# Patient Record
Sex: Female | Born: 1945
Health system: Southern US, Community
[De-identification: ages and names within clinical notes are randomized; demographics above are authoritative.]

## PROBLEM LIST (undated history)

## (undated) DIAGNOSIS — M1990 Unspecified osteoarthritis, unspecified site: Secondary | ICD-10-CM

## (undated) DIAGNOSIS — Z9889 Other specified postprocedural states: Secondary | ICD-10-CM

## (undated) DIAGNOSIS — M1710 Unilateral primary osteoarthritis, unspecified knee: Secondary | ICD-10-CM

## (undated) DIAGNOSIS — J189 Pneumonia, unspecified organism: Secondary | ICD-10-CM

## (undated) DIAGNOSIS — T4145XA Adverse effect of unspecified anesthetic, initial encounter: Secondary | ICD-10-CM

## (undated) DIAGNOSIS — M179 Osteoarthritis of knee, unspecified: Secondary | ICD-10-CM

## (undated) DIAGNOSIS — J302 Other seasonal allergic rhinitis: Secondary | ICD-10-CM

## (undated) DIAGNOSIS — R931 Abnormal findings on diagnostic imaging of heart and coronary circulation: Secondary | ICD-10-CM

## (undated) DIAGNOSIS — T8859XA Other complications of anesthesia, initial encounter: Secondary | ICD-10-CM

## (undated) DIAGNOSIS — R112 Nausea with vomiting, unspecified: Secondary | ICD-10-CM

## (undated) DIAGNOSIS — Z973 Presence of spectacles and contact lenses: Secondary | ICD-10-CM

## (undated) HISTORY — PX: ABDOMINAL HYSTERECTOMY: SHX81

## (undated) HISTORY — PX: TONSILLECTOMY: SUR1361

## (undated) HISTORY — PX: DILATION AND CURETTAGE OF UTERUS: SHX78

## (undated) HISTORY — PX: TUBAL LIGATION: SHX77

---

## 1998-11-15 ENCOUNTER — Other Ambulatory Visit: Admission: RE | Admit: 1998-11-15 | Discharge: 1998-11-15 | Payer: Self-pay | Admitting: Obstetrics and Gynecology

## 1999-10-07 ENCOUNTER — Inpatient Hospital Stay (HOSPITAL_COMMUNITY): Admission: RE | Admit: 1999-10-07 | Discharge: 1999-10-08 | Payer: Self-pay | Admitting: Urology

## 2000-01-23 ENCOUNTER — Encounter: Payer: Self-pay | Admitting: Obstetrics and Gynecology

## 2000-01-23 ENCOUNTER — Encounter: Admission: RE | Admit: 2000-01-23 | Discharge: 2000-01-23 | Payer: Self-pay | Admitting: Obstetrics and Gynecology

## 2000-11-24 HISTORY — PX: BLADDER SUSPENSION: SHX72

## 2000-12-18 ENCOUNTER — Other Ambulatory Visit: Admission: RE | Admit: 2000-12-18 | Discharge: 2000-12-18 | Payer: Self-pay | Admitting: Obstetrics and Gynecology

## 2001-01-25 ENCOUNTER — Encounter: Payer: Self-pay | Admitting: Obstetrics and Gynecology

## 2001-01-25 ENCOUNTER — Encounter: Admission: RE | Admit: 2001-01-25 | Discharge: 2001-01-25 | Payer: Self-pay | Admitting: Obstetrics and Gynecology

## 2002-01-21 ENCOUNTER — Ambulatory Visit (HOSPITAL_COMMUNITY): Admission: RE | Admit: 2002-01-21 | Discharge: 2002-01-21 | Payer: Self-pay | Admitting: Gastroenterology

## 2002-02-16 ENCOUNTER — Encounter: Admission: RE | Admit: 2002-02-16 | Discharge: 2002-02-16 | Payer: Self-pay | Admitting: Obstetrics and Gynecology

## 2002-02-16 ENCOUNTER — Encounter: Payer: Self-pay | Admitting: Obstetrics and Gynecology

## 2002-02-21 ENCOUNTER — Encounter: Payer: Self-pay | Admitting: Obstetrics and Gynecology

## 2002-02-21 ENCOUNTER — Encounter: Admission: RE | Admit: 2002-02-21 | Discharge: 2002-02-21 | Payer: Self-pay | Admitting: Obstetrics and Gynecology

## 2002-08-02 ENCOUNTER — Encounter: Payer: Self-pay | Admitting: Obstetrics and Gynecology

## 2002-08-02 ENCOUNTER — Encounter: Admission: RE | Admit: 2002-08-02 | Discharge: 2002-08-02 | Payer: Self-pay | Admitting: Obstetrics and Gynecology

## 2003-02-24 ENCOUNTER — Encounter: Payer: Self-pay | Admitting: Obstetrics and Gynecology

## 2003-02-24 ENCOUNTER — Encounter: Admission: RE | Admit: 2003-02-24 | Discharge: 2003-02-24 | Payer: Self-pay | Admitting: Obstetrics and Gynecology

## 2004-01-11 ENCOUNTER — Other Ambulatory Visit: Admission: RE | Admit: 2004-01-11 | Discharge: 2004-01-11 | Payer: Self-pay | Admitting: Obstetrics and Gynecology

## 2004-01-12 ENCOUNTER — Encounter: Admission: RE | Admit: 2004-01-12 | Discharge: 2004-01-12 | Payer: Self-pay | Admitting: Obstetrics and Gynecology

## 2004-03-06 ENCOUNTER — Encounter: Admission: RE | Admit: 2004-03-06 | Discharge: 2004-03-06 | Payer: Self-pay | Admitting: Obstetrics and Gynecology

## 2005-03-26 ENCOUNTER — Encounter: Admission: RE | Admit: 2005-03-26 | Discharge: 2005-03-26 | Payer: Self-pay | Admitting: Obstetrics and Gynecology

## 2006-04-07 ENCOUNTER — Encounter: Admission: RE | Admit: 2006-04-07 | Discharge: 2006-04-07 | Payer: Self-pay | Admitting: Obstetrics and Gynecology

## 2007-04-12 ENCOUNTER — Encounter: Admission: RE | Admit: 2007-04-12 | Discharge: 2007-04-12 | Payer: Self-pay | Admitting: Obstetrics and Gynecology

## 2008-04-12 ENCOUNTER — Encounter: Admission: RE | Admit: 2008-04-12 | Discharge: 2008-04-12 | Payer: Self-pay | Admitting: Obstetrics and Gynecology

## 2009-05-02 ENCOUNTER — Encounter: Admission: RE | Admit: 2009-05-02 | Discharge: 2009-05-02 | Payer: Self-pay | Admitting: Obstetrics and Gynecology

## 2010-05-13 ENCOUNTER — Encounter: Admission: RE | Admit: 2010-05-13 | Discharge: 2010-05-13 | Payer: Self-pay | Admitting: Obstetrics and Gynecology

## 2011-04-14 ENCOUNTER — Other Ambulatory Visit: Payer: Self-pay | Admitting: Obstetrics and Gynecology

## 2011-04-14 DIAGNOSIS — Z1231 Encounter for screening mammogram for malignant neoplasm of breast: Secondary | ICD-10-CM

## 2011-05-21 ENCOUNTER — Ambulatory Visit
Admission: RE | Admit: 2011-05-21 | Discharge: 2011-05-21 | Disposition: A | Discharge status: Home / Self Care | Payer: BC Managed Care – PPO | Source: Ambulatory Visit | Attending: Obstetrics and Gynecology | Admitting: Obstetrics and Gynecology

## 2011-05-21 DIAGNOSIS — Z1231 Encounter for screening mammogram for malignant neoplasm of breast: Secondary | ICD-10-CM

## 2012-05-31 ENCOUNTER — Other Ambulatory Visit: Payer: Self-pay | Admitting: Obstetrics and Gynecology

## 2012-05-31 DIAGNOSIS — Z1231 Encounter for screening mammogram for malignant neoplasm of breast: Secondary | ICD-10-CM

## 2012-06-11 ENCOUNTER — Ambulatory Visit
Admission: RE | Admit: 2012-06-11 | Discharge: 2012-06-11 | Disposition: A | Discharge status: Home / Self Care | Payer: Medicare Other | Source: Ambulatory Visit | Attending: Obstetrics and Gynecology | Admitting: Obstetrics and Gynecology

## 2012-06-11 DIAGNOSIS — Z1231 Encounter for screening mammogram for malignant neoplasm of breast: Secondary | ICD-10-CM

## 2013-06-17 DIAGNOSIS — E785 Hyperlipidemia, unspecified: Secondary | ICD-10-CM | POA: Insufficient documentation

## 2013-06-17 DIAGNOSIS — K589 Irritable bowel syndrome without diarrhea: Secondary | ICD-10-CM | POA: Insufficient documentation

## 2013-06-17 DIAGNOSIS — M1990 Unspecified osteoarthritis, unspecified site: Secondary | ICD-10-CM | POA: Insufficient documentation

## 2013-07-18 ENCOUNTER — Other Ambulatory Visit: Payer: Self-pay

## 2013-07-18 DIAGNOSIS — Z1231 Encounter for screening mammogram for malignant neoplasm of breast: Secondary | ICD-10-CM

## 2013-08-09 ENCOUNTER — Ambulatory Visit
Admission: RE | Admit: 2013-08-09 | Discharge: 2013-08-09 | Disposition: A | Discharge status: Home / Self Care | Payer: Medicare Other | Source: Ambulatory Visit

## 2013-08-09 DIAGNOSIS — Z1231 Encounter for screening mammogram for malignant neoplasm of breast: Secondary | ICD-10-CM

## 2014-07-10 ENCOUNTER — Other Ambulatory Visit: Payer: Self-pay | Admitting: Internal Medicine

## 2014-07-10 DIAGNOSIS — Z1231 Encounter for screening mammogram for malignant neoplasm of breast: Secondary | ICD-10-CM

## 2014-08-10 ENCOUNTER — Ambulatory Visit
Admission: RE | Admit: 2014-08-10 | Discharge: 2014-08-10 | Disposition: A | Discharge status: Home / Self Care | Payer: Medicare Other | Source: Ambulatory Visit | Attending: Internal Medicine | Admitting: Internal Medicine

## 2014-08-10 DIAGNOSIS — Z1231 Encounter for screening mammogram for malignant neoplasm of breast: Secondary | ICD-10-CM

## 2015-03-29 ENCOUNTER — Other Ambulatory Visit: Payer: Self-pay | Admitting: Orthopaedic Surgery

## 2015-03-29 DIAGNOSIS — M545 Low back pain: Secondary | ICD-10-CM

## 2015-03-31 ENCOUNTER — Ambulatory Visit
Admission: RE | Admit: 2015-03-31 | Discharge: 2015-03-31 | Disposition: A | Discharge status: Home / Self Care | Payer: Medicare Other | Source: Ambulatory Visit | Attending: Orthopaedic Surgery | Admitting: Orthopaedic Surgery

## 2015-03-31 DIAGNOSIS — M545 Low back pain: Secondary | ICD-10-CM

## 2015-05-24 ENCOUNTER — Other Ambulatory Visit (HOSPITAL_COMMUNITY): Payer: Self-pay | Admitting: Specialist

## 2015-05-25 ENCOUNTER — Encounter (HOSPITAL_COMMUNITY)
Admission: RE | Admit: 2015-05-25 | Discharge: 2015-05-25 | Disposition: A | Discharge status: Home / Self Care | Payer: Medicare Other | Source: Ambulatory Visit | Attending: Specialist | Admitting: Specialist

## 2015-05-25 ENCOUNTER — Encounter (HOSPITAL_COMMUNITY): Payer: Self-pay

## 2015-05-25 DIAGNOSIS — M5126 Other intervertebral disc displacement, lumbar region: Secondary | ICD-10-CM | POA: Insufficient documentation

## 2015-05-25 DIAGNOSIS — Z01812 Encounter for preprocedural laboratory examination: Secondary | ICD-10-CM | POA: Diagnosis present

## 2015-05-25 HISTORY — DX: Other complications of anesthesia, initial encounter: T88.59XA

## 2015-05-25 HISTORY — DX: Unspecified osteoarthritis, unspecified site: M19.90

## 2015-05-25 HISTORY — DX: Adverse effect of unspecified anesthetic, initial encounter: T41.45XA

## 2015-05-25 LAB — CBC
HCT: 41.4 % (ref 36.0–46.0)
Hemoglobin: 13.6 g/dL (ref 12.0–15.0)
MCH: 29.6 pg (ref 26.0–34.0)
MCHC: 32.9 g/dL (ref 30.0–36.0)
MCV: 90 fL (ref 78.0–100.0)
Platelets: 412 10*3/uL — ABNORMAL HIGH (ref 150–400)
RBC: 4.6 MIL/uL (ref 3.87–5.11)
RDW: 13.2 % (ref 11.5–15.5)
WBC: 8.9 10*3/uL (ref 4.0–10.5)

## 2015-05-25 LAB — SURGICAL PCR SCREEN
MRSA, PCR: NEGATIVE
STAPHYLOCOCCUS AUREUS: NEGATIVE

## 2015-05-25 NOTE — Pre-Procedure Instructions (Addendum)
    Lance Sellheresa K Windt  05/25/2015     No Pharmacies Listed   Your procedure is scheduled on 05/29/15.  Report to [facility] Admitting at 7 A.M.  Call this number if you have problems the morning of surgery:  [phone]   Remember:  Do not eat food or drink liquids after midnight.  Take these medicines the morning of surgery with A SIP OF WATER: Zyrtec, Hydrocodone   STOP taking any NSAIDs (Nonsteroidal Anti-inflammatories), Vitamins, Fish Oil, Supplements, Goody's/ BC powders, aspirins 5 days prior to surgery.     Do not wear jewelry, make-up or nail polish.  Do not wear lotions, powders, or perfumes.  You may wear deodorant.  Do not shave 48 hours prior to surgery.    Do not bring valuables to the hospital.  [facility] not responsible for any belongings or valuables.  Contacts, dentures or bridgework may not be worn into surgery.  Leave your suitcase in the car.  After surgery it may be brought to your room.  For patients admitted to the hospital, discharge time will be determined by your treatment team.  Patients discharged the day of surgery will not be allowed to drive home.   Name and phone number of your driver:    Special instructions:    Please read over the following fact sheets that you were given. Pain Booklet, Coughing and Deep Breathing and Surgical Site Infection Prevention

## 2015-05-29 ENCOUNTER — Ambulatory Visit (HOSPITAL_COMMUNITY): Payer: Medicare Other

## 2015-05-29 ENCOUNTER — Ambulatory Visit (HOSPITAL_COMMUNITY): Payer: Medicare Other | Admitting: Anesthesiology

## 2015-05-29 ENCOUNTER — Observation Stay (HOSPITAL_COMMUNITY)
Admission: RE | Admit: 2015-05-29 | Discharge: 2015-05-30 | Disposition: A | Discharge status: Home / Self Care | Payer: Medicare Other | Source: Ambulatory Visit | Attending: Specialist | Admitting: Specialist

## 2015-05-29 ENCOUNTER — Encounter (HOSPITAL_COMMUNITY)
Admission: RE | Disposition: A | Discharge status: Home / Self Care | Payer: Self-pay | Source: Ambulatory Visit | Attending: Specialist

## 2015-05-29 ENCOUNTER — Encounter (HOSPITAL_COMMUNITY): Payer: Self-pay | Admitting: *Deleted

## 2015-05-29 DIAGNOSIS — M5126 Other intervertebral disc displacement, lumbar region: Secondary | ICD-10-CM | POA: Diagnosis not present

## 2015-05-29 DIAGNOSIS — Z87891 Personal history of nicotine dependence: Secondary | ICD-10-CM | POA: Diagnosis not present

## 2015-05-29 DIAGNOSIS — M4316 Spondylolisthesis, lumbar region: Secondary | ICD-10-CM | POA: Diagnosis present

## 2015-05-29 DIAGNOSIS — Z419 Encounter for procedure for purposes other than remedying health state, unspecified: Secondary | ICD-10-CM

## 2015-05-29 DIAGNOSIS — M5116 Intervertebral disc disorders with radiculopathy, lumbar region: Secondary | ICD-10-CM | POA: Diagnosis present

## 2015-05-29 HISTORY — PX: LUMBAR LAMINECTOMY: SHX95

## 2015-05-29 LAB — COMPREHENSIVE METABOLIC PANEL
ALBUMIN: 3.8 g/dL (ref 3.5–5.0)
ALT: 23 U/L (ref 14–54)
ANION GAP: 12 (ref 5–15)
AST: 28 U/L (ref 15–41)
Alkaline Phosphatase: 89 U/L (ref 38–126)
BUN: 15 mg/dL (ref 6–20)
CO2: 28 mmol/L (ref 22–32)
Calcium: 9.9 mg/dL (ref 8.9–10.3)
Chloride: 100 mmol/L — ABNORMAL LOW (ref 101–111)
Creatinine, Ser: 0.73 mg/dL (ref 0.44–1.00)
GFR calc non Af Amer: >60 mL/min (ref >60)
Glucose, Bld: 94 mg/dL (ref 65–99)
Potassium: 4.5 mmol/L (ref 3.5–5.1)
SODIUM: 140 mmol/L (ref 135–145)
TOTAL PROTEIN: 7.5 g/dL (ref 6.5–8.1)
Total Bilirubin: 1.2 mg/dL (ref 0.3–1.2)

## 2015-05-29 SURGERY — MICRODISCECTOMY LUMBAR LAMINECTOMY
Anesthesia: General | Site: Spine Lumbar

## 2015-05-29 MED ORDER — ROCURONIUM BROMIDE 100 MG/10ML IV SOLN
INTRAVENOUS | Status: DC | PRN
  Administered 2015-05-29: 30 mg via INTRAVENOUS

## 2015-05-29 MED ORDER — HYDROCODONE-ACETAMINOPHEN 5-325 MG PO TABS
1.0000 | ORAL_TABLET | ORAL | Status: DC | PRN
  Administered 2015-05-29 – 2015-05-30 (×2): 1 via ORAL
  Filled 2015-05-29 (×2): qty 1

## 2015-05-29 MED ORDER — SODIUM CHLORIDE 0.9 % IV SOLN: 250.0000 mL | INTRAVENOUS | Status: DC

## 2015-05-29 MED ORDER — CHLORHEXIDINE GLUCONATE 4 % EX LIQD: 60.0000 mL | Freq: Once | CUTANEOUS | Status: DC

## 2015-05-29 MED ORDER — MORPHINE SULFATE 2 MG/ML IJ SOLN: 1.0000 mg | INTRAMUSCULAR | Status: DC | PRN

## 2015-05-29 MED ORDER — GLYCOPYRROLATE 0.2 MG/ML IJ SOLN
INTRAMUSCULAR | Status: AC
  Filled 2015-05-29: qty 1

## 2015-05-29 MED ORDER — DEXAMETHASONE SODIUM PHOSPHATE 4 MG/ML IJ SOLN
INTRAMUSCULAR | Status: DC | PRN
Start: 2015-05-29 — End: 2015-05-29
  Administered 2015-05-29: 4 mg via INTRAVENOUS

## 2015-05-29 MED ORDER — METHOCARBAMOL 500 MG PO TABS: 500.0000 mg | ORAL_TABLET | Freq: Four times a day (QID) | ORAL | Status: DC | PRN

## 2015-05-29 MED ORDER — BUPIVACAINE LIPOSOME 1.3 % IJ SUSP
20.0000 mL | INTRAMUSCULAR | Status: DC
  Filled 2015-05-29: qty 20

## 2015-05-29 MED ORDER — METOCLOPRAMIDE HCL 5 MG/ML IJ SOLN
INTRAMUSCULAR | Status: AC
  Filled 2015-05-29: qty 2

## 2015-05-29 MED ORDER — ONDANSETRON HCL 4 MG/2ML IJ SOLN
INTRAMUSCULAR | Status: DC | PRN
  Administered 2015-05-29: 4 mg via INTRAVENOUS

## 2015-05-29 MED ORDER — DIPHENHYDRAMINE HCL 50 MG/ML IJ SOLN
INTRAMUSCULAR | Status: DC | PRN
  Administered 2015-05-29: 12.5 mg via INTRAVENOUS

## 2015-05-29 MED ORDER — OXYCODONE HCL 5 MG PO TABS: 5.0000 mg | ORAL_TABLET | Freq: Once | ORAL | Status: DC | PRN

## 2015-05-29 MED ORDER — PROPOFOL 10 MG/ML IV BOLUS
INTRAVENOUS | Status: DC | PRN
  Administered 2015-05-29: 120 mg via INTRAVENOUS
  Administered 2015-05-29: 30 mg via INTRAVENOUS

## 2015-05-29 MED ORDER — THROMBIN 20000 UNITS EX KIT
PACK | CUTANEOUS | Status: DC | PRN
  Administered 2015-05-29: 20 mL via TOPICAL

## 2015-05-29 MED ORDER — SODIUM CHLORIDE 0.9 % IJ SOLN: 3.0000 mL | INTRAMUSCULAR | Status: DC | PRN

## 2015-05-29 MED ORDER — ALUM & MAG HYDROXIDE-SIMETH 200-200-20 MG/5ML PO SUSP: 30.0000 mL | Freq: Four times a day (QID) | ORAL | Status: DC | PRN

## 2015-05-29 MED ORDER — HYDROMORPHONE HCL 1 MG/ML IJ SOLN: 0.2500 mg | INTRAMUSCULAR | Status: DC | PRN

## 2015-05-29 MED ORDER — 0.9 % SODIUM CHLORIDE (POUR BTL) OPTIME
TOPICAL | Status: DC | PRN
Start: 2015-05-29 — End: 2015-05-29
  Administered 2015-05-29: 1000 mL

## 2015-05-29 MED ORDER — PROMETHAZINE HCL 25 MG/ML IJ SOLN: 6.2500 mg | INTRAMUSCULAR | Status: DC | PRN

## 2015-05-29 MED ORDER — ONDANSETRON HCL 4 MG/2ML IJ SOLN: 4.0000 mg | INTRAMUSCULAR | Status: DC | PRN

## 2015-05-29 MED ORDER — MIDAZOLAM HCL 5 MG/5ML IJ SOLN
INTRAMUSCULAR | Status: DC | PRN
  Administered 2015-05-29: 1 mg via INTRAVENOUS

## 2015-05-29 MED ORDER — CEFAZOLIN SODIUM 1-5 GM-% IV SOLN
1.0000 g | Freq: Three times a day (TID) | INTRAVENOUS | Status: AC
  Administered 2015-05-29 – 2015-05-30 (×2): 1 g via INTRAVENOUS
  Filled 2015-05-29 (×2): qty 50

## 2015-05-29 MED ORDER — SCOPOLAMINE 1 MG/3DAYS TD PT72
1.0000 | MEDICATED_PATCH | TRANSDERMAL | Status: DC
  Administered 2015-05-29: 1.5 mg via TRANSDERMAL
  Filled 2015-05-29: qty 1

## 2015-05-29 MED ORDER — METOCLOPRAMIDE HCL 5 MG/ML IJ SOLN
INTRAMUSCULAR | Status: DC | PRN
  Administered 2015-05-29: 10 mg via INTRAVENOUS

## 2015-05-29 MED ORDER — FENTANYL CITRATE (PF) 100 MCG/2ML IJ SOLN
INTRAMUSCULAR | Status: DC | PRN
  Administered 2015-05-29: 125 ug via INTRAVENOUS
  Administered 2015-05-29: 25 ug via INTRAVENOUS
  Administered 2015-05-29: 50 ug via INTRAVENOUS

## 2015-05-29 MED ORDER — ROCURONIUM BROMIDE 50 MG/5ML IV SOLN
INTRAVENOUS | Status: AC
  Filled 2015-05-29: qty 1

## 2015-05-29 MED ORDER — OXYCODONE HCL 5 MG/5ML PO SOLN: 5.0000 mg | Freq: Once | ORAL | Status: DC | PRN

## 2015-05-29 MED ORDER — BUPIVACAINE HCL 0.5 % IJ SOLN
INTRAMUSCULAR | Status: DC | PRN
  Administered 2015-05-29: 20 mL
  Administered 2015-05-29: 5 mL

## 2015-05-29 MED ORDER — MIDAZOLAM HCL 2 MG/2ML IJ SOLN
INTRAMUSCULAR | Status: AC
  Filled 2015-05-29: qty 2

## 2015-05-29 MED ORDER — CEFAZOLIN SODIUM-DEXTROSE 2-3 GM-% IV SOLR
2.0000 g | INTRAVENOUS | Status: AC
  Administered 2015-05-29: 2 g via INTRAVENOUS

## 2015-05-29 MED ORDER — SCOPOLAMINE 1 MG/3DAYS TD PT72
MEDICATED_PATCH | TRANSDERMAL | Status: AC
  Administered 2015-05-29: 1.5 mg via TRANSDERMAL
  Filled 2015-05-29: qty 1

## 2015-05-29 MED ORDER — GLYCOPYRROLATE 0.2 MG/ML IJ SOLN
INTRAMUSCULAR | Status: DC | PRN
  Administered 2015-05-29: 0.1 mg via INTRAVENOUS

## 2015-05-29 MED ORDER — FENTANYL CITRATE (PF) 250 MCG/5ML IJ SOLN
INTRAMUSCULAR | Status: AC
  Filled 2015-05-29: qty 5

## 2015-05-29 MED ORDER — ACETAMINOPHEN 650 MG RE SUPP: 650.0000 mg | RECTAL | Status: DC | PRN

## 2015-05-29 MED ORDER — DOCUSATE SODIUM 100 MG PO CAPS
100.0000 mg | ORAL_CAPSULE | Freq: Two times a day (BID) | ORAL | Status: DC
  Administered 2015-05-29: 100 mg via ORAL
  Filled 2015-05-29: qty 1

## 2015-05-29 MED ORDER — EPHEDRINE SULFATE 50 MG/ML IJ SOLN
INTRAMUSCULAR | Status: AC
  Filled 2015-05-29: qty 1

## 2015-05-29 MED ORDER — LIDOCAINE HCL (CARDIAC) 20 MG/ML IV SOLN
INTRAVENOUS | Status: DC | PRN
  Administered 2015-05-29: 50 mg via INTRAVENOUS

## 2015-05-29 MED ORDER — ONDANSETRON HCL 4 MG/2ML IJ SOLN
INTRAMUSCULAR | Status: AC
  Filled 2015-05-29: qty 2

## 2015-05-29 MED ORDER — SODIUM CHLORIDE 0.9 % IJ SOLN
INTRAMUSCULAR | Status: AC
  Filled 2015-05-29: qty 10

## 2015-05-29 MED ORDER — KETOROLAC TROMETHAMINE 30 MG/ML IJ SOLN
30.0000 mg | Freq: Once | INTRAMUSCULAR | Status: AC
  Administered 2015-05-29: 30 mg via INTRAVENOUS
  Filled 2015-05-29: qty 1

## 2015-05-29 MED ORDER — MENTHOL 3 MG MT LOZG: 1.0000 | LOZENGE | OROMUCOSAL | Status: DC | PRN

## 2015-05-29 MED ORDER — BUPIVACAINE LIPOSOME 1.3 % IJ SUSP
INTRAMUSCULAR | Status: DC | PRN
Start: 2015-05-29 — End: 2015-05-29
  Administered 2015-05-29: 5 mL
  Administered 2015-05-29: 20 mL

## 2015-05-29 MED ORDER — OXYCODONE-ACETAMINOPHEN 5-325 MG PO TABS: 1.0000 | ORAL_TABLET | Freq: Four times a day (QID) | ORAL | Status: DC | PRN

## 2015-05-29 MED ORDER — PROPOFOL 10 MG/ML IV BOLUS
INTRAVENOUS | Status: AC
  Filled 2015-05-29: qty 20

## 2015-05-29 MED ORDER — LORATADINE 10 MG PO TABS
10.0000 mg | ORAL_TABLET | Freq: Every day | ORAL | Status: DC
  Administered 2015-05-29: 10 mg via ORAL
  Filled 2015-05-29: qty 1

## 2015-05-29 MED ORDER — EPHEDRINE SULFATE 50 MG/ML IJ SOLN
INTRAMUSCULAR | Status: DC | PRN
  Administered 2015-05-29 (×2): 5 mg via INTRAVENOUS
  Administered 2015-05-29: 10 mg via INTRAVENOUS
  Administered 2015-05-29 (×4): 5 mg via INTRAVENOUS

## 2015-05-29 MED ORDER — ACETAMINOPHEN 325 MG PO TABS: 650.0000 mg | ORAL_TABLET | ORAL | Status: DC | PRN

## 2015-05-29 MED ORDER — POLYETHYLENE GLYCOL 3350 17 G PO PACK: 17.0000 g | PACK | Freq: Every day | ORAL | Status: DC | PRN

## 2015-05-29 MED ORDER — THROMBIN 20000 UNITS EX SOLR
CUTANEOUS | Status: AC
  Filled 2015-05-29: qty 20000

## 2015-05-29 MED ORDER — OXYCODONE-ACETAMINOPHEN 5-325 MG PO TABS: 1.0000 | ORAL_TABLET | ORAL | Status: DC | PRN

## 2015-05-29 MED ORDER — TIZANIDINE HCL 2 MG PO TABS: 2.0000 mg | ORAL_TABLET | Freq: Four times a day (QID) | ORAL | Status: DC | PRN

## 2015-05-29 MED ORDER — ATORVASTATIN CALCIUM 40 MG PO TABS
40.0000 mg | ORAL_TABLET | Freq: Every day | ORAL | Status: DC
  Administered 2015-05-29: 40 mg via ORAL
  Filled 2015-05-29: qty 1

## 2015-05-29 MED ORDER — LACTATED RINGERS IV SOLN
INTRAVENOUS | Status: DC
  Administered 2015-05-29 (×3): via INTRAVENOUS

## 2015-05-29 MED ORDER — PHENOL 1.4 % MT LIQD: 1.0000 | OROMUCOSAL | Status: DC | PRN

## 2015-05-29 MED ORDER — DIPHENHYDRAMINE HCL 50 MG/ML IJ SOLN
INTRAMUSCULAR | Status: AC
  Filled 2015-05-29: qty 1

## 2015-05-29 MED ORDER — DEXAMETHASONE SODIUM PHOSPHATE 4 MG/ML IJ SOLN
INTRAMUSCULAR | Status: AC
  Filled 2015-05-29: qty 1

## 2015-05-29 MED ORDER — SODIUM CHLORIDE 0.9 % IJ SOLN
3.0000 mL | Freq: Two times a day (BID) | INTRAMUSCULAR | Status: DC
  Administered 2015-05-29: 3 mL via INTRAVENOUS

## 2015-05-29 MED ORDER — CEFAZOLIN SODIUM-DEXTROSE 2-3 GM-% IV SOLR
INTRAVENOUS | Status: AC
  Filled 2015-05-29: qty 50

## 2015-05-29 MED ORDER — METHOCARBAMOL 1000 MG/10ML IJ SOLN
500.0000 mg | Freq: Four times a day (QID) | INTRAMUSCULAR | Status: DC | PRN
  Filled 2015-05-29: qty 5

## 2015-05-29 SURGICAL SUPPLY — 52 items
ADH SKN CLS APL DERMABOND .7 (GAUZE/BANDAGES/DRESSINGS) ×1
BUR MATCHSTICK NEURO 3.0 LAGG (BURR) ×2 IMPLANT
BUR ROUND FLUTED 4 SOFT TCH (BURR) IMPLANT
BUR ROUND FLUTED 4MM SOFT TCH (BURR)
CANISTER SUCTION 2500CC (MISCELLANEOUS) ×3 IMPLANT
CLOSURE WOUND 1/2 X4 (GAUZE/BANDAGES/DRESSINGS) ×1
COVER MAYO STAND STRL (DRAPES) ×3 IMPLANT
COVER SURGICAL LIGHT HANDLE (MISCELLANEOUS) ×3 IMPLANT
DERMABOND ADVANCED (GAUZE/BANDAGES/DRESSINGS) ×2
DERMABOND ADVANCED .7 DNX12 (GAUZE/BANDAGES/DRESSINGS) ×1 IMPLANT
DRAPE C-ARM 42X72 X-RAY (DRAPES) ×3 IMPLANT
DRAPE MICROSCOPE LEICA (MISCELLANEOUS) ×3 IMPLANT
DRAPE PROXIMA HALF (DRAPES) ×2 IMPLANT
DRAPE SURG 17X23 STRL (DRAPES) ×12 IMPLANT
DRSG MEPILEX BORDER 4X4 (GAUZE/BANDAGES/DRESSINGS) ×2 IMPLANT
DRSG MEPILEX BORDER 4X8 (GAUZE/BANDAGES/DRESSINGS) IMPLANT
DURAPREP 26ML APPLICATOR (WOUND CARE) ×3 IMPLANT
ELECT BLADE 4.0 EZ CLEAN MEGAD (MISCELLANEOUS) ×3
ELECT CAUTERY BLADE 6.4 (BLADE) ×3 IMPLANT
ELECT REM PT RETURN 9FT ADLT (ELECTROSURGICAL) ×3
ELECTRODE BLDE 4.0 EZ CLN MEGD (MISCELLANEOUS) ×1 IMPLANT
ELECTRODE REM PT RTRN 9FT ADLT (ELECTROSURGICAL) ×1 IMPLANT
GLOVE BIOGEL PI IND STRL 8 (GLOVE) ×1 IMPLANT
GLOVE BIOGEL PI INDICATOR 8 (GLOVE) ×2
GLOVE ECLIPSE 8.5 STRL (GLOVE) ×3 IMPLANT
GLOVE ORTHO TXT STRL SZ7.5 (GLOVE) ×3 IMPLANT
GLOVE SURG 8.5 LATEX PF (GLOVE) ×3 IMPLANT
GOWN STRL REUS W/ TWL LRG LVL3 (GOWN DISPOSABLE) ×2 IMPLANT
GOWN STRL REUS W/TWL 2XL LVL3 (GOWN DISPOSABLE) ×6 IMPLANT
GOWN STRL REUS W/TWL LRG LVL3 (GOWN DISPOSABLE) ×6
KIT BASIN OR (CUSTOM PROCEDURE TRAY) ×3 IMPLANT
KIT ROOM TURNOVER OR (KITS) ×3 IMPLANT
NDL SPNL 18GX3.5 QUINCKE PK (NEEDLE) ×2 IMPLANT
NEEDLE SPNL 18GX3.5 QUINCKE PK (NEEDLE) ×6 IMPLANT
NS IRRIG 1000ML POUR BTL (IV SOLUTION) ×3 IMPLANT
PACK LAMINECTOMY ORTHO (CUSTOM PROCEDURE TRAY) ×3 IMPLANT
PAD ARMBOARD 7.5X6 YLW CONV (MISCELLANEOUS) ×6 IMPLANT
PATTIES SURGICAL .5 X.5 (GAUZE/BANDAGES/DRESSINGS) ×2 IMPLANT
PATTIES SURGICAL .75X.75 (GAUZE/BANDAGES/DRESSINGS) IMPLANT
SPONGE LAP 4X18 X RAY DECT (DISPOSABLE) IMPLANT
SPONGE SURGIFOAM ABS GEL 100 (HEMOSTASIS) ×2 IMPLANT
STRIP CLOSURE SKIN 1/2X4 (GAUZE/BANDAGES/DRESSINGS) ×1 IMPLANT
SUT VIC AB 1 CTX 36 (SUTURE) ×3
SUT VIC AB 1 CTX36XBRD ANBCTR (SUTURE) ×1 IMPLANT
SUT VIC AB 2-0 CT1 27 (SUTURE) ×3
SUT VIC AB 2-0 CT1 TAPERPNT 27 (SUTURE) ×1 IMPLANT
SUT VIC AB 3-0 X1 27 (SUTURE) ×3 IMPLANT
SUT VICRYL 0 UR6 27IN ABS (SUTURE) ×3 IMPLANT
SYR 20CC LL (SYRINGE) ×3 IMPLANT
SYR CONTROL 10ML LL (SYRINGE) ×3 IMPLANT
TOWEL OR 17X24 6PK STRL BLUE (TOWEL DISPOSABLE) ×3 IMPLANT
TOWEL OR 17X26 10 PK STRL BLUE (TOWEL DISPOSABLE) ×3 IMPLANT

## 2015-05-29 NOTE — Anesthesia Preprocedure Evaluation (Addendum)
Anesthesia Evaluation  Patient identified by MRN, date of birth, ID band Patient awake    Reviewed: Allergy & Precautions, NPO status , Patient's Chart, lab work & pertinent test results  History of Anesthesia Complications (+) PONV  Airway Mallampati: II  TM Distance: >3 FB Neck ROM: Full    Dental   Pulmonary former smoker,  breath sounds clear to auscultation        Cardiovascular negative cardio ROS  Rhythm:Regular Rate:Normal     Neuro/Psych negative neurological ROS     GI/Hepatic negative GI ROS, Neg liver ROS,   Endo/Other  negative endocrine ROS  Renal/GU negative Renal ROS     Musculoskeletal  (+) Arthritis -,   Abdominal   Peds  Hematology negative hematology ROS (+)   Anesthesia Other Findings   Reproductive/Obstetrics                            Anesthesia Physical Anesthesia Plan  ASA: II  Anesthesia Plan: General   Post-op Pain Management:    Induction: Intravenous  Airway Management Planned: Oral ETT  Additional Equipment:   Intra-op Plan:   Post-operative Plan: Extubation in OR  Informed Consent: I have reviewed the patients History and Physical, chart, labs and discussed the procedure including the risks, benefits and alternatives for the proposed anesthesia with the patient or authorized representative who has indicated his/her understanding and acceptance.   Dental advisory given  Plan Discussed with: CRNA  Anesthesia Plan Comments:         Anesthesia Quick Evaluation

## 2015-05-29 NOTE — Anesthesia Postprocedure Evaluation (Signed)
  Anesthesia Post-op Note  Patient: Bianca Ware  Procedure(s) Performed: Procedure(s): LEFT L4-5 MICRODISCECTOMY (N/A)  Patient Location: PACU  Anesthesia Type:General  Level of Consciousness: awake, alert  and oriented  Airway and Oxygen Therapy: Patient Spontanous Breathing  Post-op Pain: mild  Post-op Assessment: Post-op Vital signs reviewed              Post-op Vital Signs: Reviewed  Last Vitals:  Filed Vitals:   05/29/15 1250  BP: 159/70  Pulse: 72  Temp: 36.4 C  Resp: 17    Complications: No apparent anesthesia complications

## 2015-05-29 NOTE — Brief Op Note (Signed)
05/29/2015  11:34 AM  PATIENT:  Bianca Ware  69 y.o. female  PRE-OPERATIVE DIAGNOSIS:  Left L4-5 herniated nucleus pulposus  POST-OPERATIVE DIAGNOSIS:  Left L4-5 herniated nucleus pulposus  PROCEDURE:  Procedure(s): LEFT L4-5 MICRODISCECTOMY (N/A)  SURGEON:  Surgeon(s) and Role:    * Kerrin ChampagneJames E Zoa Dowty, MD - Primary  PHYSICIAN ASSISTANT:Zuleica Seith Barry Dieneswens, PA-C    ANESTHESIA:   local and general, Dr. Sampson GoonFitzgerald.  EBL:  Total I/O In: 1000 [I.V.:1000] Out: 150 [Blood:150]  BLOOD ADMINISTERED:none  DRAINS: none   LOCAL MEDICATIONS USED:  MARCAINE 0.5% 1:1 EXPAREL 1.3%  Amount: 30 ml  SPECIMEN:  No Specimen  DISPOSITION OF SPECIMEN:  N/A  COUNTS:  YES  TOURNIQUET:  * No tourniquets in log *  DICTATION: .Dragon Dictation  PLAN OF CARE: Admit for overnight observation  PATIENT DISPOSITION:  PACU - hemodynamically stable.   Delay start of Pharmacological VTE agent (>24hrs) due to surgical blood loss or risk of bleeding: yes

## 2015-05-29 NOTE — Plan of Care (Signed)
Problem: Consults Goal: Diagnosis - Spinal Surgery Microdiscectomy     

## 2015-05-29 NOTE — Transfer of Care (Signed)
Immediate Anesthesia Transfer of Care Note  Patient: Bianca Ware  Procedure(s) Performed: Procedure(s): LEFT L4-5 MICRODISCECTOMY (N/A)  Patient Location: PACU  Anesthesia Type:General  Level of Consciousness: awake, alert  and oriented  Airway & Oxygen Therapy: Patient Spontanous Breathing and Patient connected to nasal cannula oxygen  Post-op Assessment: Report given to RN and Post -op Vital signs reviewed and stable  Post vital signs: Reviewed and stable  Last Vitals:  Filed Vitals:   05/29/15 1202  BP: 160/72  Pulse: 94  Temp: 36.6 C  Resp: 10    Complications: No apparent anesthesia complications

## 2015-05-29 NOTE — H&P (Signed)
Bianca Ware is an 69 y.o. female.    Bianca Ware is seen today.  She is complaining of severe increasing pain into her left buttock, radiation into the left anterior thigh and lateral thigh.  Pain radiates below the knee on the left side.  She underwent a left-sided L4-5 transforaminal ESI, that injection undertaken on 05/08/2015.  The injection seemed to do well with significant relief of pain, and she reports that over the last weekend that her pain levels had improved to the point where she was having actually no pain, and she was able to stand and ambulate much better.  The pain had been previously into the left groin area.  Her pain is severe with any length of standing and walking, and today she is seen; she is standing upright, unable to bear much in the way of weight on the left leg.  She reports that trying to stand upright is with severe pain today.  The pain is into the buttock.  It radiates down the left thigh, anterolateral.   Her studies show a grade 1 spondylolisthesis of L4 on L5.  There is a focal disk herniation into the left L4-5 neural foramen, and this is likely affecting the left L4 nerve root.      Past Medical History  Diagnosis Date  . Complication of anesthesia     waking up  . Arthritis     Past Surgical History  Procedure Laterality Date  . Tonsillectomy    . Abdominal hysterectomy    . Dilation and curettage of uterus    . Tubal ligation      No family history on file. Social History:  reports that she quit smoking about 38 years ago. Her smoking use included Cigarettes. She has a 3.75 pack-year smoking history. She does not have any smokeless tobacco history on file. Her alcohol and drug histories are not on file.  Allergies: No Known Allergies  No prescriptions prior to admission    No results found for this or any previous visit (from the past 48 hour(s)). No results found.  Review of Systems  Constitutional: Negative.   HENT: Negative.    Eyes: Negative.   Respiratory: Negative.   Cardiovascular: Negative.   Gastrointestinal: Negative.   Genitourinary: Negative.   Musculoskeletal: Positive for back pain.  Skin: Negative.   Psychiatric/Behavioral: Negative.     There were no vitals taken for this visit. Physical Exam  Constitutional: She is oriented to person, place, and time. She appears well-developed.  HENT:  Head: Normocephalic.  Eyes: EOM are normal. Pupils are equal, round, and reactive to light.  Neck: Normal range of motion.  Respiratory: No respiratory distress.  GI: She exhibits no distension.  Neurological: She is alert and oriented to person, place, and time.  Skin: Skin is warm and dry.  Psychiatric: She has a normal mood and affect.    PHYSICAL EXAMINATION:  Clinically, she has weakness in left knee extension at 4/5.  Left foot dorsiflexion weakness is also 4/5 now.  Straight leg raise is positive on the left.  Range of motion of the left hip is limited in terms of flexion to about 90 degrees.  Internal rotation is nil.  External rotation 20 to 25 degrees.  Plain radiographs have demonstrated severe osteoarthritis of the left hip, but the relief of pain she had with the ESI is suggestive of the left L4-5 disk herniation as the most likely source of her pain.  She is awake  and alert, oriented x4.  There are no skin abnormalities noted, no open wounds.  The area where she had the ESI is healed.   ASSESSMENT:  Left foraminal HNP.  This is on the medial aspect of the foramen and appears to be subarticular affecting the left L4 nerve root.  Pain temporarily improved with the ESI, transforaminal, here.   PLAN:  At this point I have told Bianca Ware that I think the best way to deal with this is going to be to go ahead and take the disk herniation.  She has a grade 1 spondylolisthesis.  It is very low grade, and I think the resection of disk here should be able to be done without a great deal of loss of stability here.   It is, however, a concern, and she needs to know about it beforehand because of the risk of taking disk material decreasing the size and height of the disk can result in a problem of increased instability and increased slip resulting in the need  for a fusion; however, she has not needed a fusion thus far, and it appears as though the acute disk herniation is the major source of pain.  The current level of slip is a grade 1, not necessarily needing intervention as it is only 2 mm or 3 mm at the most.  Our plan would be for a very minimal resection of joint while performing a lateral recess decompression to excise the disk herniation over the medial foramen on the left side at L4.  Discussed this with Bianca Ware.  She wishes to proceed as soon as possible.  Her pain is severe.  I went ahead and wrote her prescriptions for hydrocodone and gabapentin.  We will go ahead and schedule her for surgery.  The risk of infection 1 in 300.  Risk of bleeding is minimal, and no expected need for blood transfusion, less than 0.5% chance of this.  Risks to the nerve root 1 in 10,000. Bianca Ware M 05/29/2015, 12:20 AM

## 2015-05-29 NOTE — Evaluation (Addendum)
Physical Therapy Evaluation Patient Details Name: Bianca Ware MRN: [id number] DOB: 03/21/1946 Today's Date: 05/29/2015   History of Present Illness  pt s/p L45 microdiscectomy for HNP  Clinical Impression  Pt is at or close to baseline functioning and should be safe at home with available assist of husband and family.  All education has been completed and questions addressed.   There are no further acute PT needs.  Will sign off at this time.     Follow Up Recommendations No PT follow up    Equipment Recommendations  None recommended by PT (pt to buy cane if needed, instructed how to use it.)    Recommendations for Other Services       Precautions / Restrictions Precautions Precautions: Back (advised)      Mobility  Bed Mobility Overal bed mobility: Needs Assistance Bed Mobility: Sidelying to Sit;Sit to Sidelying;Rolling Rolling: Supervision Sidelying to sit: Supervision     Sit to sidelying: Supervision General bed mobility comments: instructed in and practiced log roll and transition side to/from sit  Transfers Overall transfer level: Needs assistance Equipment used: Ambulation equipment used (iv pole to decrease pain) Transfers: Sit to/from Stand Sit to Stand: Supervision            Ambulation/Gait Ambulation/Gait assistance: Supervision Ambulation Distance (Feet): 150 Feet Assistive device:  (iv pole to decrease L LE pain)       General Gait Details: mildly antalgic gait on L, but steady and safe  Stairs            Wheelchair Mobility    Modified Rankin (Stroke Patients Only)       Balance Overall balance assessment: No apparent balance deficits (not formally assessed)                                           Pertinent Vitals/Pain Pain Assessment: Faces Faces Pain Scale: Hurts even more Pain Location: L leg Pain Descriptors / Indicators: Grimacing Pain Intervention(s): Monitored during session    Home Living  Family/patient expects to be discharged to:: Private residence Living Arrangements: Spouse/significant other Available Help at Discharge: Family;Available 24 hours/day Type of Home: House Home Access: Level entry     Home Layout: One level Home Equipment: None      Prior Function Level of Independence: Independent               Hand Dominance        Extremity/Trunk Assessment   Upper Extremity Assessment: Overall WFL for tasks assessed           Lower Extremity Assessment: Overall WFL for tasks assessed         Communication   Communication: No difficulties  Cognition Arousal/Alertness: Awake/alert Behavior During Therapy: WFL for tasks assessed/performed Overall Cognitive Status: Within Functional Limits for tasks assessed                      General Comments General comments (skin integrity, edema, etc.): Advised pt/family about back care/precautions, log roll, transitions side to/from sit, lifting restrictions and typical progression of activity.    Exercises        Assessment/Plan    PT Assessment Patent does not need any further PT services  PT Diagnosis Acute pain   PT Problem List    PT Treatment Interventions     PT Goals (Current goals can  be found in the Care Plan section) Acute Rehab PT Goals PT Goal Formulation: All assessment and education complete, DC therapy    Frequency     Barriers to discharge        Co-evaluation               End of Session   Activity Tolerance: Patient tolerated treatment well Patient left: in bed;with call bell/phone within reach;with family/visitor present Nurse Communication: Mobility status         Time: 1415-1435 PT Time Calculation (min) (ACUTE ONLY): 20 min   Charges:   PT Evaluation $Initial PT Evaluation Tier I: 1 Procedure     PT G Codes:        Arie Gable, Eliseo Gum 05/29/2015, 3:08 PM 05/29/2015  Pelham Bing, PT [phone]  (pager)

## 2015-05-29 NOTE — Discharge Instructions (Signed)
   No lifting greater than 10 lbs. Avoid bending, stooping and twisting. Walk in house for first week them may start to get out slowly increasing distance up to one half mile by 3 weeks post op. Keep incision dry for 3 days, may use tegaderm or similar water impervious dressing.  

## 2015-05-29 NOTE — Op Note (Signed)
05/29/2015  11:40 AM  PATIENT:  Bianca Ware  69 y.o. female  MRN: [id number]  OPERATIVE REPORT  PRE-OPERATIVE DIAGNOSIS:  Left L4-5 herniated nucleus pulposus  POST-OPERATIVE DIAGNOSIS:  Left L4-5 herniated nucleus pulposus  PROCEDURE:  Procedure(s): LEFT L4-5 MICRODISCECTOMY    SURGEON:  Jessy Oto, MD     ASSISTANT:  Benjiman Core, PA-C  (Present throughout the entire procedure and necessary for completion of procedure in a timely manner)     ANESTHESIA:  General,supplemented with local anesthesia marcaine 0.5% 1:1 exparel 1.3%, total 30cc.  EBL:50cc  DRAINS: none.    COMPLICATIONS:  None.   PROCEDURE:The patient was met in the holding area, and the appropriate Left Lumbar level L4-5 identified and marked with "x" and my initials.The patient was then transported to OR and was placed under general anesthesia without difficulty. The patient received appropriate preoperative antibiotic prophylaxis. The patient after intubation atraumatically was transferred to the operating room table, prone position, Wilson frame, sliding OR table. All pressure points were well padded. The arms in 90-90 well-padded at the elbows. Standard prep with iodoform solution lower dorsal spine to the mid sacral segment. Draped in the usual manner clear Vi-Drape was used. Time-out procedure was called and correct. A 18-gauge spinal needle was then inserted at the expected L4-5 level. Cross table radiograph was draped sterilely to the field and used to identify the spinal needle position. The needle was posterior to the L4-5 disc space. Skin was then infiltrated with Marcaine half percent 1:1 exparel 1.3% total of 20 cc used. An incision approximately an inch inch in length was then made through skin and subcutaneous layers in line with the left side of the expected midline just superior to the spinal needle entry point. An incision made into the left lumbosacral fascia approximately an inch in length .  Cobb  elevatorwas then used to carefully form subperiosteal dissection of the paralumbar muscles off of the posterior lamina of the expected L4-5 level. The depth measured off of the cobb elevator at about 50 mm and 50 mm insight retractor then placed on the scaffolding for the MIS equipment down to and docking on the posterior aspect of the lamina at the expected L4-5 level. This was sterilely attached to the articulating arm and it's up right which had been attached the OR table sterilely. Lateral radiograph was used to identify a Penfield #4 and the retractors at the appropriate level L4-5 opposite, superior and superficial to the L5 pedicle . The operating room microscope sterilely draped brought into the field. Under the operating room microscope, the L4-5 interspace carefully debrided the small amount of muscle attachment here and high-speed bur used to drill the medial aspect of the inferior articular process of L4 approximately 20%. 2 mm Kerrison then used to enter the spinal canal over the superior aspect of the L5 lamina carefully using the Kerrison to debris the attachment as a curet. Foraminotomy was then performed over the left L5 nerve root. The medial 10% superior articular process of L5 and then resected using 2 mm Kerrison. This allowed for identification of the thecal sac. Penfield 4 was then used to carefully mobilize the thecal sac medially and the L5 nerve root identified within the lateral recess flattened over the posterior aspect of the protruded disc and deep to the hypertrophic left L4-5 facet. Carefully the lateral aspect of the L5 nerve root was identified and a Penfield 4 was used to mobilize the nerve medially such that the  protruded disc was visible with microscope. Using a Penfield 4 for retraction and a 15 blade scalpel was used to incise the posterior longitudinal ligament within the lateral recess on the left side longitudinally. Disc material was removed using micropituitary rongeurs  and nerve hook nerve root and then more easily able to be mobilized medially and retracted using a Derricho retractor. Further foraminotomies was performed over the L5 nerve root the nerve root was noted to be free without further compression. The nerve root able to be retracted along the medial aspect of the L5 pedicle and disc material found to be subligamentous at this level was further resected current pituitary rongeurs.  Ligamentum flavum was further debrided superiorly to the level L4-5 disc. Had a moderate amount of further resection of the L4 lamina inferiorly and mediallywas performed. With this then the disc space at L4-5 was easily visualized and entry into the disc at the sided disc herniation was possible using a Penfield 4 intraoperative Lateral radiograph was used to identify the L4-5 disc with the Penfield 4 In place just below the disc space. Micropituitary was used to further debride this material superficially from the posterior aspect of the intervertebral disc is posterior lateral aspect of the disc. Small amount of further disc material was found subligamentous extending laterally from the disc this was removed using micropituitary rongeurs into the left L4 neuroforamen additionally epstein  currettes were used to decompress the left L4 neuroforamen.63m Kerrison was then used to further remove reflected portions of the reflected portions of the medial L4-5 facet and portions of the superior portion of the L5 superior articular  Facet. Ligamentum flavum was debrided and lateral recess along the medial aspect L4-5 facet no further decompression was necessary. Ball tip nerve probe was then able to carefully palpate the neuroforamen for L4 and L5 finding these to be well decompressed. Bleeding was then controlled using thrombin-soaked Gelfoam small cottonoids. Small amount of bleeding within the soft tissue mass the laminotomy area was controlled using bipolar electrocautery. Irrigation was  carried out using copious amounts of irrigant solution. All Gelfoam were then removed. No significant active bleeding present at the time of removal. All instruments sponge counts were correct traction system was then carefully removed carefully rotating retractors with this withdrawal and only bipolar electrocautery of any small bleeders. Lumbodorsal fascia was then carefully approximated with interrupted 0 Vicryl sutures, UR 6 needle deep subcutaneous layers were approximated with interrupted 0 Vicryl sutures on UR 6 the appear subcutaneous layers approximated with interrupted 2-0 Vicryl sutures and the skin closed with a running subcutaneous stitch of 4-0 Vicryl. Dermabond was applied allowed to dry and then Mepilex bandage applied. Patient was then carefully returned to supine position on a stretcher, reactivated and extubated. She was then returned to recovery room in satisfactory condition. JBenjiman CorePA-C perform the duties of assistant surgeon during this case. he was present from the beginning of the case to the end of the case assisting in transfer the patient from her stretcher to the OR table and back to the stretcher at the end of the case. Assisted in careful retraction and suction of the laminectomy site delicate neural structures operating under the operating room microscope. he performed closure of the incision from the fascia to the skin applying the dressing.     NITKA,JAMES E  05/29/2015, 11:40 AM

## 2015-05-29 NOTE — Anesthesia Procedure Notes (Signed)
Procedure Name: Intubation Date/Time: 05/29/2015 9:49 AM Performed by: Rudi RummageLOWDER, Langston Summerfield J Pre-anesthesia Checklist: Patient identified, Emergency Drugs available, Suction available, Patient being monitored and Timeout performed Patient Re-evaluated:Patient Re-evaluated prior to inductionOxygen Delivery Method: Circle system utilized Preoxygenation: Pre-oxygenation with 100% oxygen Intubation Type: IV induction Ventilation: Mask ventilation without difficulty Laryngoscope Size: Mac and 4 Grade View: Grade II Tube type: Oral Tube size: 7.0 mm Number of attempts: 1 Airway Equipment and Method: Stylet

## 2015-05-30 ENCOUNTER — Encounter (HOSPITAL_COMMUNITY): Payer: Self-pay | Admitting: Specialist

## 2015-05-30 DIAGNOSIS — M5126 Other intervertebral disc displacement, lumbar region: Secondary | ICD-10-CM | POA: Diagnosis not present

## 2015-05-30 LAB — GLUCOSE, CAPILLARY: Glucose-Capillary: 209 mg/dL — ABNORMAL HIGH (ref 65–99)

## 2015-05-30 MED ORDER — HYDROCODONE-ACETAMINOPHEN 7.5-325 MG PO TABS: 1.0000 | ORAL_TABLET | Freq: Four times a day (QID) | ORAL | Status: DC | PRN

## 2015-05-30 NOTE — Care Management Note (Signed)
Case Management Note  Patient Details  Name: Bianca Ware MRN: [id number] Date of Birth: 05/18/1946  Subjective/Objective:     69 yr old female s/p microdiscectomy               Action/Plan:   Patient has no HH needs. Case manager ordered DME as requested.      Expected Discharge Date:                  Expected Discharge Plan:     In-House Referral:  NA  Discharge planning Services  CM Consult  Post Acute Care Choice:  Durable Medical Equipment Choice offered to:     DME Arranged:  Walker rolling DME Agency:  [facility]  HH Arranged:  NA HH Agency:     Status of Service:  Completed, signed off  Medicare Important Message Given: NA   Date Medicare IM Given:    Medicare IM give by:    Date Additional Medicare IM Given:    Additional Medicare Important Message give by:     If discussed at Long Length of Stay Meetings, dates discussed:    Additional Comments:  Bianca Ware, Bianca Kuyper [name], RN 05/30/2015, 10:22 AM

## 2015-05-30 NOTE — Progress Notes (Signed)
     Subjective: 1 Day Post-Op Procedure(s) (LRB): LEFT L4-5 MICRODISCECTOMY (N/A) Awake, alert and oriented x 4. Mild discomfort. BM last evening. Voiding without difficulty. PT saw last evening and signed off patient doing well. Patient reports pain as mild.    Objective:   VITALS:  Temp:  [97.6 F (36.4 C)-98.5 F (36.9 C)] 98.5 F (36.9 C) (07/06 0450) Pulse Rate:  [69-94] 69 (07/06 0450) Resp:  [10-18] 18 (07/06 0450) BP: (120-173)/(52-72) 130/52 mmHg (07/06 0450) SpO2:  [95 %-100 %] 98 % (07/06 0450)  Neurologically intact ABD soft Neurovascular intact Sensation intact distally Intact pulses distally Dorsiflexion/Plantar flexion intact Incision: no drainage No cellulitis present   LABS No results for input(s): HGB, WBC, PLT in the last 72 hours.  Recent Labs  05/29/15 0728  NA 140  K 4.5  CL 100*  CO2 28  BUN 15  CREATININE 0.73  GLUCOSE 94   No results for input(s): LABPT, INR in the last 72 hours.   Assessment/Plan: 1 Day Post-Op Procedure(s) (LRB): LEFT L4-5 MICRODISCECTOMY (N/A)  Advance diet Discharge home Discontinue 3 in one commode, she has elevated toilet and tub chair in shower. Would like to use a walker, will order.   Artist Bloom E 05/30/2015, 8:22 AM

## 2015-05-30 NOTE — Discharge Summary (Signed)
Physician Discharge Summary      Patient ID: Bianca Ware [place] MRN: [id number] DOB/AGE: 68/06/1946 68 y.o.  Admit date: 05/29/2015 Discharge date:   Admission Diagnoses:  Principal Problem:   Lumbar disc herniation with radiculopathy Active Problems:   Spondylolisthesis of lumbar region   Discharge Diagnoses:  Same  Past Medical History  Diagnosis Date  . Complication of anesthesia     waking up  . Arthritis     Surgeries: Procedure(s): LEFT L4-5 MICRODISCECTOMY on 05/29/2015   Consultants:    Discharged Condition: Improved  Hospital Course: Bianca Ware [place] is an 69 y.o. female who was admitted 05/29/2015 with a chief complaint of No chief complaint on file. , and found to have a diagnosis of Lumbar disc herniation with radiculopathy.She was brought to the operating room on 05/29/2015 and underwent the above named procedures.    They were given perioperative antibiotics:  Anti-infectives    Start     Dose/Rate Route Frequency Ordered Stop   05/29/15 1800  ceFAZolin (ANCEF) IVPB 1 g/50 mL premix     1 g 100 mL/hr over 30 Minutes Intravenous Every 8 hours 05/29/15 1254 05/30/15 0203   05/29/15 0731  ceFAZolin (ANCEF) 2-3 GM-% IVPB SOLR    Comments:  Shireen Quanodd, Robert   : cabinet override      05/29/15 0731 05/29/15 1944   05/29/15 0729  ceFAZolin (ANCEF) IVPB 2 g/50 mL premix     2 g 100 mL/hr over 30 Minutes Intravenous On call to O.R. 05/29/15 0729 05/29/15 0955    1 Day Post-Op Procedure(s) (LRB): LEFT L4-5 MICRODISCECTOMY (N/A) Awake, alert and oriented x 4. Mild discomfort. BM last evening. Voiding without difficulty. PT saw last evening and signed off patient doing well. Patient reports pain as mild. Dressing  Changed, no erythrema or drainage. Discharged home on POD#1, to use walker for early post op ambulation at home and then [place].   She was given sequential compression devices, and early ambulation for DVT prophylaxis.  She benefited maximally from their  hospital stay and there were no complications.    Recent vital signs:  Filed Vitals:   05/30/15 0450  BP: 130/52  Pulse: 69  Temp: 98.5 F (36.9 C)  Resp: 18    Recent laboratory studies:  Results for orders placed or performed during the hospital encounter of 05/29/15  Comprehensive metabolic panel  Result Value Ref Range   Sodium 140 135 - 145 mmol/L   Potassium 4.5 3.5 - 5.1 mmol/L   Chloride 100 (L) 101 - 111 mmol/L   CO2 28 22 - 32 mmol/L   Glucose, Bld 94 65 - 99 mg/dL   BUN 15 6 - 20 mg/dL   Creatinine, Ser 6.600.73 0.44 - 1.00 mg/dL   Calcium 9.9 8.9 - 63.010.3 mg/dL   Total Protein 7.5 6.5 - 8.1 g/dL   Albumin 3.8 3.5 - 5.0 g/dL   AST 28 15 - 41 U/L   ALT 23 14 - 54 U/L   Alkaline Phosphatase 89 38 - 126 U/L   Total Bilirubin 1.2 0.3 - 1.2 mg/dL   GFR calc non Af Amer >60 >60 mL/min   GFR calc Af Amer >60 >60 mL/min   Anion gap 12 5 - 15    Discharge Medications:     Medication List    TAKE these medications        atorvastatin 40 MG tablet  Commonly known as:  LIPITOR  Take 40 mg by mouth daily with  supper.     Biotin 5000 MCG Caps  Take 5,000 mcg by mouth 2 (two) times daily.     CALCIUM + D PO  Take 2 tablets by mouth daily. Calcium 1200 mg, vitamin d 1000 units     cetirizine 10 MG tablet  Commonly known as:  ZYRTEC  Take 10 mg by mouth at bedtime.     GLUCOSAMINE-CHONDROITIN PO  Take 1 tablet by mouth 3 (three) times daily. Mobil Flex 1190 (glucosamine 1500 mg with chrondroitin)     HYDROcodone-acetaminophen 7.5-325 MG per tablet  Commonly known as:  NORCO  Take 1-2 tablets by mouth every 6 (six) hours as needed for moderate pain.     Melatonin 3 MG Tabs  Take 6 mg by mouth at bedtime.     Omega-3 1000 MG Caps  Take 1,000 mg by mouth 3 (three) times daily.     tiZANidine 2 MG tablet  Commonly known as:  ZANAFLEX  Take 1 tablet (2 mg total) by mouth every 6 (six) hours as needed for muscle spasms.     vitamin C 500 MG tablet  Commonly  known as:  ASCORBIC ACID  Take 500 mg by mouth daily.        Diagnostic Studies: Dg Lumbar Spine 2-3 Views  05/29/2015   ADDENDUM REPORT: 05/29/2015 13:31  ADDENDUM: An additional image was added to the study after the original interpretation. This is labeled #3. This demonstrates a posterior surgical instrument at the L4-5 level.   Electronically Signed   By: Charlett Nose M.D.   On: 05/29/2015 13:31   05/29/2015   CLINICAL DATA:  L4-5 microdiscectomy.  EXAM: LUMBAR SPINE - 2-3 VIEW  COMPARISON:  MRI 03/31/2015  FINDINGS: Two intraoperative spot images demonstrate localizing instruments directed at the L4-5 level.  IMPRESSION: Intraoperative localization as above.  Electronically Signed: By: Charlett Nose M.D. On: 05/29/2015 10:52    Disposition:       Discharge Instructions    Call MD / Call 911    Complete by:  As directed   If you experience chest pain or shortness of breath, CALL 911 and be transported to the hospital emergency room.  If you develope a fever above 101 F, pus (white drainage) or increased drainage or redness at the wound, or calf pain, call your surgeon's office.     Constipation Prevention    Complete by:  As directed   Drink plenty of fluids.  Prune juice may be helpful.  You may use a stool softener, such as Colace (over the counter) 100 mg twice a day.  Use MiraLax (over the counter) for constipation as needed.     Diet - low sodium heart healthy    Complete by:  As directed      Discharge instructions    Complete by:  As directed   No lifting greater than 10 lbs. Avoid bending, stooping and twisting. Walk in house for first week them may start to get out slowly increasing distance up to one mile by 3 weeks post op. Keep incision dry for 3 days, may use tegaderm or similar water impervious dressing.     Driving restrictions    Complete by:  As directed   No driving for 4 weeks     Increase activity slowly as tolerated    Complete by:  As directed      Lifting  restrictions    Complete by:  As directed   No lifting for 6  weeks           Follow-up Information    Follow up with Chijioke Lasser E, MD In 2 weeks.   Specialty:  Orthopedic Surgery   Contact information:   [address] [place] [address] [phone]        Signed: Kerrin Champagne 05/30/2015, 8:29 AM

## 2015-05-30 NOTE — Progress Notes (Signed)
NURSING PROGRESS NOTE  Bianca Ware [id number] Discharge Data: 05/30/2015 11:30 AM Attending Provider: Kerrin ChampagneJames E Nitka, MD WUJ:WJXB,JYNWGNFAOZPCP:AVVA,RAVISANKAR R, MD     Bianca Ware to be D/C'd Home per MD order.  Discussed with the patient the After Visit Summary and all questions fully answered. All IV's discontinued with no bleeding noted. All belongings returned to patient for patient to take home.   Last Vital Signs:  Blood pressure 130/52, pulse 69, temperature 98.5 F (36.9 C), temperature source Oral, resp. rate 18, weight 80.74 kg (178 lb), SpO2 98 %.  Discharge Medication List   Medication List    TAKE these medications        atorvastatin 40 MG tablet  Commonly known as:  LIPITOR  Take 40 mg by mouth daily with supper.     Biotin 5000 MCG Caps  Take 5,000 mcg by mouth 2 (two) times daily.     CALCIUM + D PO  Take 2 tablets by mouth daily. Calcium 1200 mg, vitamin d 1000 units     cetirizine 10 MG tablet  Commonly known as:  ZYRTEC  Take 10 mg by mouth at bedtime.     GLUCOSAMINE-CHONDROITIN PO  Take 1 tablet by mouth 3 (three) times daily. Mobil Flex 1190 (glucosamine 1500 mg with chrondroitin)     HYDROcodone-acetaminophen 7.5-325 MG per tablet  Commonly known as:  NORCO  Take 1-2 tablets by mouth every 6 (six) hours as needed for moderate pain.     Melatonin 3 MG Tabs  Take 6 mg by mouth at bedtime.     Omega-3 1000 MG Caps  Take 1,000 mg by mouth 3 (three) times daily.     tiZANidine 2 MG tablet  Commonly known as:  ZANAFLEX  Take 1 tablet (2 mg total) by mouth every 6 (six) hours as needed for muscle spasms.     vitamin C 500 MG tablet  Commonly known as:  ASCORBIC ACID  Take 500 mg by mouth daily.

## 2015-05-31 MED FILL — Thrombin For Soln 20000 Unit: CUTANEOUS | Qty: 1 | Status: AC

## 2015-06-12 NOTE — Progress Notes (Signed)
Late entry for 05/30/15  05/30/15 0900  OT Visit Information  Last OT Received On 05/30/15  Assistance Needed +1  History of Present Illness pt s/p L45 microdiscectomy for HNP  Precautions  Precautions Back  Precaution Comments reviewed BAT precautions  Restrictions  Weight Bearing Restrictions No  Home Living  Family/patient expects to be discharged to: Private residence  Living Arrangements Spouse/significant other  Available Help at Discharge Family;Available 24 hours/day  Type of Home House  Home Access Level entry  Home Layout One level  Bathroom Shower/Tub Walk-in shower  Bathroom Toilet Handicapped height  Bathroom Accessibility Yes  How Accessible Accessible via walker  Home Equipment Shower seat - built in;Grab bars - toilet;Grab bars - tub/shower;Hand held shower head;Toilet riser  Prior Function  Level of Independence Independent  Communication  Communication No difficulties  Pain Assessment  Pain Assessment 0-10  Pain Score 1  Pain Location back  Pain Descriptors / Indicators Sore  Pain Intervention(s) Monitored during session;Premedicated before session  Cognition  Arousal/Alertness Awake/alert  Behavior During Therapy WFL for tasks assessed/performed  Overall Cognitive Status Within Functional Limits for tasks assessed  Upper Extremity Assessment  Upper Extremity Assessment Overall WFL for tasks assessed  Lower Extremity Assessment  Lower Extremity Assessment Overall WFL for tasks assessed  ADL  Overall ADL's  Needs assistance/impaired  Eating/Feeding Set up;Sitting  Grooming Standing;Supervision/safety;Set up;Sitting  Upper Body Bathing Set up;Sitting  Lower Body Bathing Supervison/ safety;Sit to/from stand  Upper Body Dressing  Set up;Sitting  Lower Body Dressing Supervision/safety;Sit to/from Lawyerstand  Toilet Transfer Supervision/safety;Ambulation  Toileting- Clothing Manipulation and Hygiene Supervision/safety;Sitting/lateral lean  Tub/ Brewing technologisthower Transfer  Supervision/safety;Ambulation  Functional mobility during ADLs Supervision/safety;Rolling walker  General ADL Comments ADL education provided on techniques and AE for back precautions.   Bed Mobility  General bed mobility comments in recliner  Transfers  Overall transfer level Needs assistance  Transfers Sit to/from Stand  Sit to Stand Supervision  Balance  Overall balance assessment No apparent balance deficits (not formally assessed)  OT - End of Session  Equipment Utilized During Treatment Gait belt  Activity Tolerance Patient tolerated treatment well  Patient left in chair;with call bell/phone within reach  OT Assessment  OT Recommendation/Assessment Patient does not need any further OT services  OT Recommendation  Follow Up Recommendations Supervision - Intermittent  OT Equipment None recommended by OT  OT Time Calculation  OT Start Time (ACUTE ONLY) 0842  OT Stop Time (ACUTE ONLY) 0852  OT Time Calculation (min) 10 min  OT G-codes **NOT FOR INPATIENT CLASS**  Functional Assessment Tool Used clinical judgment  Functional Limitation Self care  Self Care Current Status (R6045(G8987) CI  Self Care Goal Status (W0981(G8988) CI  Self Care Discharge Status (X9147(G8989) CI  OT General Charges  $OT Visit 1 Procedure  OT Evaluation  $Initial OT Evaluation Tier I 1 Procedure  Raynald KempKathryn Franchot Pollitt OTR/L Pager: [phone]

## 2015-06-13 NOTE — Progress Notes (Signed)
Late Entry Addendum to the Initial Evaluation    05/29/15 1500  PT Time Calculation  PT Start Time (ACUTE ONLY) 1415  PT Stop Time (ACUTE ONLY) 1435  PT Time Calculation (min) (ACUTE ONLY) 20 min  PT G-Codes **NOT FOR INPATIENT CLASS**  Functional Assessment Tool Used clinical judgement  Functional Limitation Mobility: Walking and moving around  Mobility: Walking and Moving Around Current Status (A2130(G8978) CI  Mobility: Walking and Moving Around Goal Status (Q6578(G8979) CI  Mobility: Walking and Moving Around Discharge Status (I6962(G8980) CI  PT General Charges  $$ ACUTE PT VISIT 1 Procedure  PT Evaluation  $Initial PT Evaluation Tier I 1 Procedure   06/13/2015   BingKen Tirza Senteno, PT [phone]  (pager)

## 2015-07-25 ENCOUNTER — Other Ambulatory Visit (HOSPITAL_COMMUNITY): Payer: Self-pay | Admitting: Orthopaedic Surgery

## 2015-08-31 ENCOUNTER — Encounter (HOSPITAL_COMMUNITY)
Admission: RE | Admit: 2015-08-31 | Discharge: 2015-08-31 | Disposition: A | Discharge status: Home / Self Care | Payer: Medicare Other | Source: Ambulatory Visit | Attending: Orthopaedic Surgery | Admitting: Orthopaedic Surgery

## 2015-08-31 ENCOUNTER — Encounter (HOSPITAL_COMMUNITY): Payer: Self-pay

## 2015-08-31 DIAGNOSIS — Z01818 Encounter for other preprocedural examination: Secondary | ICD-10-CM | POA: Insufficient documentation

## 2015-08-31 DIAGNOSIS — M1612 Unilateral primary osteoarthritis, left hip: Secondary | ICD-10-CM | POA: Diagnosis not present

## 2015-08-31 HISTORY — DX: Nausea with vomiting, unspecified: R11.2

## 2015-08-31 HISTORY — DX: Other specified postprocedural states: Z98.890

## 2015-08-31 LAB — BASIC METABOLIC PANEL
ANION GAP: 6 (ref 5–15)
BUN: 15 mg/dL (ref 6–20)
CALCIUM: 9.4 mg/dL (ref 8.9–10.3)
CO2: 31 mmol/L (ref 22–32)
Chloride: 103 mmol/L (ref 101–111)
Creatinine, Ser: 0.55 mg/dL (ref 0.44–1.00)
GLUCOSE: 134 mg/dL — AB (ref 65–99)
Potassium: 3.9 mmol/L (ref 3.5–5.1)
Sodium: 140 mmol/L (ref 135–145)

## 2015-08-31 LAB — SURGICAL PCR SCREEN
MRSA, PCR: NEGATIVE
Staphylococcus aureus: NEGATIVE

## 2015-08-31 LAB — CBC
HCT: 41.7 % (ref 36.0–46.0)
HEMOGLOBIN: 13.8 g/dL (ref 12.0–15.0)
MCH: 29.6 pg (ref 26.0–34.0)
MCHC: 33.1 g/dL (ref 30.0–36.0)
MCV: 89.3 fL (ref 78.0–100.0)
PLATELETS: 246 10*3/uL (ref 150–400)
RBC: 4.67 MIL/uL (ref 3.87–5.11)
RDW: 13.6 % (ref 11.5–15.5)
WBC: 6 10*3/uL (ref 4.0–10.5)

## 2015-08-31 LAB — PROTIME-INR
INR: 0.95 (ref 0.00–1.49)
Prothrombin Time: 12.8 seconds (ref 11.6–15.2)

## 2015-08-31 LAB — APTT: aPTT: 28 seconds (ref 24–37)

## 2015-08-31 LAB — ABO/RH: ABO/RH(D): A POS

## 2015-08-31 NOTE — Patient Instructions (Addendum)
Bianca Ware  08/31/2015   Your procedure is scheduled on: Friday 09/07/15  Report to [facility] Main  Entrance take [facility]  elevators to 3rd floor to  Short Stay Center at 8:00AM.  Call this number if you have problems the morning of surgery [phone]   Remember: ONLY 1 PERSON MAY GO WITH YOU TO SHORT STAY TO GET  READY MORNING OF YOUR SURGERY.  Do not eat food or drink liquids: After Midnight.     Take these medicines the morning of surgery with A SIP OF WATER: NONE                               You may not have any metal on your body including hair pins and              piercings  Do not wear jewelry, make-up, lotions, powders or perfumes, deodorant             Do not wear nail polish.  Do not shave  48 hours prior to surgery.           Do not bring valuables to the hospital. [facility] [place] IS NOT             RESPONSIBLE   FOR VALUABLES.  Contacts, dentures or bridgework may not be worn into surgery.  Leave suitcase in the car. After surgery it may be brought to your room.    Special Instructions: practice deep breathing and leg exercises              Please read over the following fact sheets you were given: _____________________________________________________________________             [facility] - Preparing for Surgery Before surgery, you can play an important role.  Because skin is not sterile, your skin needs to be as free of germs as possible.  You can reduce the number of germs on your skin by washing with CHG (chlorahexidine gluconate) soap before surgery.  CHG is an antiseptic cleaner which kills germs and bonds with the skin to continue killing germs even after washing. Please DO NOT use if you have an allergy to CHG or antibacterial soaps.  If your skin becomes reddened/irritated stop using the CHG and inform your nurse when you arrive at Short Stay. Do not shave (including legs and underarms) for at least 48 hours prior to the first  CHG shower.  You may shave your face/neck. Please follow these instructions carefully:  1.  Shower with CHG Soap the night before surgery and the  morning of Surgery.  2.  If you choose to wash your hair, wash your hair first as usual with your  normal  shampoo.  3.  After you shampoo, rinse your hair and body thoroughly to remove the  shampoo.                           4.  Use CHG as you would any other liquid soap.  You can apply chg directly  to the skin and wash                       Gently with a scrungie or clean washcloth.  5.  Apply the CHG Soap to your body ONLY FROM  THE NECK DOWN.   Do not use on face/ open                           Wound or open sores. Avoid contact with eyes, ears mouth and genitals (private parts).                       Wash face,  Genitals (private parts) with your normal soap.             6.  Wash thoroughly, paying special attention to the area where your surgery  will be performed.  7.  Thoroughly rinse your body with warm water from the neck down.  8.  DO NOT shower/wash with your normal soap after using and rinsing off  the CHG Soap.                9.  Pat yourself dry with a clean towel.            10.  Wear clean pajamas.            11.  Place clean sheets on your bed the night of your first shower and do not  sleep with pets. Day of Surgery : Do not apply any lotions/deodorants the morning of surgery.  Please wear clean clothes to the hospital/surgery center.  FAILURE TO FOLLOW THESE INSTRUCTIONS MAY RESULT IN THE CANCELLATION OF YOUR SURGERY PATIENT SIGNATURE_________________________________  NURSE SIGNATURE__________________________________  ________________________________________________________________________  WHAT IS A BLOOD TRANSFUSION? Blood Transfusion Information  A transfusion is the replacement of blood or some of its parts. Blood is made up of multiple cells which provide different functions.  Red blood cells carry oxygen and are used  for blood loss replacement.  White blood cells fight against infection.  Platelets control bleeding.  Plasma helps clot blood.  Other blood products are available for specialized needs, such as hemophilia or other clotting disorders. BEFORE THE TRANSFUSION  Who gives blood for transfusions?   Healthy volunteers who are fully evaluated to make sure their blood is safe. This is blood bank blood. Transfusion therapy is the safest it has ever been in the practice of medicine. Before blood is taken from a donor, a complete history is taken to make sure that person has no history of diseases nor engages in risky social behavior (examples are intravenous drug use or sexual activity with multiple partners). The donor's travel history is screened to minimize risk of transmitting infections, such as malaria. The donated blood is tested for signs of infectious diseases, such as HIV and hepatitis. The blood is then tested to be sure it is compatible with you in order to minimize the chance of a transfusion reaction. If you or a relative donates blood, this is often done in anticipation of surgery and is not appropriate for emergency situations. It takes many days to process the donated blood. RISKS AND COMPLICATIONS Although transfusion therapy is very safe and saves many lives, the main dangers of transfusion include:   Getting an infectious disease.  Developing a transfusion reaction. This is an allergic reaction to something in the blood you were given. Every precaution is taken to prevent this. The decision to have a blood transfusion has been considered carefully by your caregiver before blood is given. Blood is not given unless the benefits outweigh the risks. AFTER THE TRANSFUSION  Right after receiving a blood transfusion, you will usually feel much better and  more energetic. This is especially true if your red blood cells have gotten low (anemic). The transfusion raises the level of the red blood  cells which carry oxygen, and this usually causes an energy increase.  The nurse administering the transfusion will monitor you carefully for complications. HOME CARE INSTRUCTIONS  No special instructions are needed after a transfusion. You may find your energy is better. Speak with your caregiver about any limitations on activity for underlying diseases you may have. SEEK MEDICAL CARE IF:   Your condition is not improving after your transfusion.  You develop redness or irritation at the intravenous (IV) site. SEEK IMMEDIATE MEDICAL CARE IF:  Any of the following symptoms occur over the next 12 hours:  Shaking chills.  You have a temperature by mouth above 102 F (38.9 C), not controlled by medicine.  Chest, back, or muscle pain.  People around you feel you are not acting correctly or are confused.  Shortness of breath or difficulty breathing.  Dizziness and fainting.  You get a rash or develop hives.  You have a decrease in urine output.  Your urine turns a dark color or changes to pink, red, or brown. Any of the following symptoms occur over the next 10 days:  You have a temperature by mouth above 102 F (38.9 C), not controlled by medicine.  Shortness of breath.  Weakness after normal activity.  The white part of the eye turns yellow (jaundice).  You have a decrease in the amount of urine or are urinating less often.  Your urine turns a dark color or changes to pink, red, or brown. Document Released: 11/07/2000 Document Revised: 02/02/2012 Document Reviewed: 06/26/2008 [facility] Patient Information 2014 [place], [place].  _______________________________________________________________________

## 2015-08-31 NOTE — Pre-Procedure Instructions (Signed)
EKG and CXR on chart are outdated from 07/03/14 Pt does not have parameters for CXR or EKG for pre-op visit today.

## 2015-09-07 ENCOUNTER — Inpatient Hospital Stay (HOSPITAL_COMMUNITY): Payer: Medicare Other | Admitting: Certified Registered Nurse Anesthetist

## 2015-09-07 ENCOUNTER — Inpatient Hospital Stay (HOSPITAL_COMMUNITY): Payer: Medicare Other

## 2015-09-07 ENCOUNTER — Inpatient Hospital Stay (HOSPITAL_COMMUNITY)
Admission: RE | Admit: 2015-09-07 | Discharge: 2015-09-09 | DRG: 470 | Disposition: A | Discharge status: Home Health | Payer: Medicare Other | Source: Ambulatory Visit | Attending: Orthopaedic Surgery | Admitting: Orthopaedic Surgery

## 2015-09-07 ENCOUNTER — Encounter (HOSPITAL_COMMUNITY)
Admission: RE | Disposition: A | Discharge status: Home Health | Payer: Self-pay | Source: Ambulatory Visit | Attending: Orthopaedic Surgery

## 2015-09-07 ENCOUNTER — Encounter (HOSPITAL_COMMUNITY): Payer: Self-pay | Admitting: *Deleted

## 2015-09-07 DIAGNOSIS — M1612 Unilateral primary osteoarthritis, left hip: Secondary | ICD-10-CM | POA: Diagnosis present

## 2015-09-07 DIAGNOSIS — M25552 Pain in left hip: Secondary | ICD-10-CM | POA: Diagnosis present

## 2015-09-07 DIAGNOSIS — Z419 Encounter for procedure for purposes other than remedying health state, unspecified: Secondary | ICD-10-CM

## 2015-09-07 DIAGNOSIS — Z87891 Personal history of nicotine dependence: Secondary | ICD-10-CM | POA: Diagnosis not present

## 2015-09-07 DIAGNOSIS — Z96642 Presence of left artificial hip joint: Secondary | ICD-10-CM

## 2015-09-07 DIAGNOSIS — Z01812 Encounter for preprocedural laboratory examination: Secondary | ICD-10-CM | POA: Diagnosis not present

## 2015-09-07 HISTORY — PX: TOTAL HIP ARTHROPLASTY: SHX124

## 2015-09-07 LAB — TYPE AND SCREEN
ABO/RH(D): A POS
Antibody Screen: NEGATIVE

## 2015-09-07 SURGERY — ARTHROPLASTY, HIP, TOTAL, ANTERIOR APPROACH
Anesthesia: Spinal | Site: Hip | Laterality: Left

## 2015-09-07 MED ORDER — ONDANSETRON HCL 4 MG/2ML IJ SOLN
INTRAMUSCULAR | Status: AC
  Filled 2015-09-07: qty 2

## 2015-09-07 MED ORDER — ACETAMINOPHEN 650 MG RE SUPP: 650.0000 mg | Freq: Four times a day (QID) | RECTAL | Status: DC | PRN

## 2015-09-07 MED ORDER — OXYCODONE HCL 5 MG/5ML PO SOLN: 5.0000 mg | Freq: Once | ORAL | Status: DC | PRN

## 2015-09-07 MED ORDER — SODIUM CHLORIDE 0.9 % IV SOLN
INTRAVENOUS | Status: DC
  Administered 2015-09-07 – 2015-09-08 (×2): via INTRAVENOUS

## 2015-09-07 MED ORDER — MIDAZOLAM HCL 2 MG/2ML IJ SOLN
INTRAMUSCULAR | Status: AC
  Filled 2015-09-07: qty 4

## 2015-09-07 MED ORDER — PROPOFOL 10 MG/ML IV BOLUS
INTRAVENOUS | Status: AC
  Filled 2015-09-07: qty 20

## 2015-09-07 MED ORDER — FENTANYL CITRATE (PF) 100 MCG/2ML IJ SOLN
INTRAMUSCULAR | Status: AC
  Filled 2015-09-07: qty 4

## 2015-09-07 MED ORDER — BUPIVACAINE IN DEXTROSE 0.75-8.25 % IT SOLN: INTRATHECAL | Status: DC | PRN

## 2015-09-07 MED ORDER — OXYCODONE HCL 5 MG PO TABS: 5.0000 mg | ORAL_TABLET | Freq: Once | ORAL | Status: DC | PRN

## 2015-09-07 MED ORDER — DEXAMETHASONE SODIUM PHOSPHATE 10 MG/ML IJ SOLN
INTRAMUSCULAR | Status: AC
  Filled 2015-09-07: qty 1

## 2015-09-07 MED ORDER — HYDROMORPHONE HCL 1 MG/ML IJ SOLN
INTRAMUSCULAR | Status: DC | PRN
  Administered 2015-09-07 (×2): 1 mg via INTRAVENOUS

## 2015-09-07 MED ORDER — OXYCODONE HCL 5 MG PO TABS
5.0000 mg | ORAL_TABLET | ORAL | Status: DC | PRN
  Administered 2015-09-07: 10 mg via ORAL
  Administered 2015-09-08: 5 mg via ORAL
  Administered 2015-09-08 – 2015-09-09 (×4): 10 mg via ORAL
  Filled 2015-09-07 (×6): qty 2

## 2015-09-07 MED ORDER — ASPIRIN EC 325 MG PO TBEC
325.0000 mg | DELAYED_RELEASE_TABLET | Freq: Two times a day (BID) | ORAL | Status: DC
  Administered 2015-09-08 – 2015-09-09 (×3): 325 mg via ORAL
  Filled 2015-09-07 (×5): qty 1

## 2015-09-07 MED ORDER — 0.9 % SODIUM CHLORIDE (POUR BTL) OPTIME
TOPICAL | Status: DC | PRN
  Administered 2015-09-07: 1000 mL

## 2015-09-07 MED ORDER — DEXTROSE 5 % IV SOLN
500.0000 mg | Freq: Four times a day (QID) | INTRAVENOUS | Status: DC | PRN
  Administered 2015-09-07: 500 mg via INTRAVENOUS
  Filled 2015-09-07 (×2): qty 5

## 2015-09-07 MED ORDER — TRANEXAMIC ACID 1000 MG/10ML IV SOLN
1000.0000 mg | INTRAVENOUS | Status: AC
  Administered 2015-09-07: 1000 mg via INTRAVENOUS
  Filled 2015-09-07: qty 10

## 2015-09-07 MED ORDER — DOCUSATE SODIUM 100 MG PO CAPS
100.0000 mg | ORAL_CAPSULE | Freq: Two times a day (BID) | ORAL | Status: DC
  Administered 2015-09-07: 100 mg via ORAL

## 2015-09-07 MED ORDER — MIDAZOLAM HCL 5 MG/5ML IJ SOLN
INTRAMUSCULAR | Status: DC | PRN
  Administered 2015-09-07: 2 mg via INTRAVENOUS

## 2015-09-07 MED ORDER — FENTANYL CITRATE (PF) 100 MCG/2ML IJ SOLN
INTRAMUSCULAR | Status: DC | PRN
  Administered 2015-09-07 (×4): 50 ug via INTRAVENOUS

## 2015-09-07 MED ORDER — KETOROLAC TROMETHAMINE 15 MG/ML IJ SOLN
7.5000 mg | Freq: Four times a day (QID) | INTRAMUSCULAR | Status: AC
  Administered 2015-09-07 – 2015-09-08 (×4): 7.5 mg via INTRAVENOUS
  Filled 2015-09-07 (×4): qty 1

## 2015-09-07 MED ORDER — SODIUM CHLORIDE 0.9 % IR SOLN
Status: DC | PRN
  Administered 2015-09-07: 1000 mL

## 2015-09-07 MED ORDER — DIPHENHYDRAMINE HCL 12.5 MG/5ML PO ELIX: 12.5000 mg | ORAL_SOLUTION | ORAL | Status: DC | PRN

## 2015-09-07 MED ORDER — HYDROMORPHONE HCL 1 MG/ML IJ SOLN: INTRAMUSCULAR | Status: DC | PRN

## 2015-09-07 MED ORDER — ZOLPIDEM TARTRATE 5 MG PO TABS
5.0000 mg | ORAL_TABLET | Freq: Every evening | ORAL | Status: DC | PRN
  Administered 2015-09-08: 5 mg via ORAL
  Filled 2015-09-07: qty 1

## 2015-09-07 MED ORDER — ATORVASTATIN CALCIUM 40 MG PO TABS
40.0000 mg | ORAL_TABLET | Freq: Every day | ORAL | Status: DC
Start: 2015-09-07 — End: 2015-09-09
  Administered 2015-09-07 – 2015-09-08 (×2): 40 mg via ORAL
  Filled 2015-09-07 (×3): qty 1

## 2015-09-07 MED ORDER — LACTATED RINGERS IV SOLN
INTRAVENOUS | Status: DC | PRN
  Administered 2015-09-07 (×2): via INTRAVENOUS

## 2015-09-07 MED ORDER — HYDROMORPHONE HCL 1 MG/ML IJ SOLN: 1.0000 mg | INTRAMUSCULAR | Status: DC | PRN

## 2015-09-07 MED ORDER — DEXAMETHASONE SODIUM PHOSPHATE 10 MG/ML IJ SOLN
INTRAMUSCULAR | Status: DC | PRN
  Administered 2015-09-07: 10 mg via INTRAVENOUS

## 2015-09-07 MED ORDER — CEFAZOLIN SODIUM-DEXTROSE 2-3 GM-% IV SOLR
INTRAVENOUS | Status: AC
  Filled 2015-09-07: qty 50

## 2015-09-07 MED ORDER — POLYETHYLENE GLYCOL 3350 17 G PO PACK
17.0000 g | PACK | Freq: Every day | ORAL | Status: DC | PRN
Start: 2015-09-07 — End: 2015-09-09

## 2015-09-07 MED ORDER — ACETAMINOPHEN 325 MG PO TABS
650.0000 mg | ORAL_TABLET | Freq: Four times a day (QID) | ORAL | Status: DC | PRN
Start: 2015-09-07 — End: 2015-09-09

## 2015-09-07 MED ORDER — MENTHOL 3 MG MT LOZG: 1.0000 | LOZENGE | OROMUCOSAL | Status: DC | PRN

## 2015-09-07 MED ORDER — CEFAZOLIN SODIUM-DEXTROSE 2-3 GM-% IV SOLR
2.0000 g | INTRAVENOUS | Status: AC
  Administered 2015-09-07: 2 g via INTRAVENOUS

## 2015-09-07 MED ORDER — HYDROMORPHONE HCL 2 MG/ML IJ SOLN
INTRAMUSCULAR | Status: AC
  Filled 2015-09-07: qty 1

## 2015-09-07 MED ORDER — ALUM & MAG HYDROXIDE-SIMETH 200-200-20 MG/5ML PO SUSP: 30.0000 mL | ORAL | Status: DC | PRN

## 2015-09-07 MED ORDER — ONDANSETRON HCL 4 MG/2ML IJ SOLN
4.0000 mg | Freq: Four times a day (QID) | INTRAMUSCULAR | Status: DC | PRN
  Administered 2015-09-07 (×2): 4 mg via INTRAVENOUS
  Filled 2015-09-07 (×2): qty 2

## 2015-09-07 MED ORDER — HYDROMORPHONE HCL 1 MG/ML IJ SOLN
0.2500 mg | INTRAMUSCULAR | Status: DC | PRN
  Administered 2015-09-07 (×2): 0.5 mg via INTRAVENOUS

## 2015-09-07 MED ORDER — HYDROMORPHONE HCL 1 MG/ML IJ SOLN
INTRAMUSCULAR | Status: AC
  Filled 2015-09-07: qty 1

## 2015-09-07 MED ORDER — PHENOL 1.4 % MT LIQD
1.0000 | OROMUCOSAL | Status: DC | PRN
  Filled 2015-09-07: qty 177

## 2015-09-07 MED ORDER — METHOCARBAMOL 500 MG PO TABS
500.0000 mg | ORAL_TABLET | Freq: Four times a day (QID) | ORAL | Status: DC | PRN
  Administered 2015-09-08 – 2015-09-09 (×3): 500 mg via ORAL
  Filled 2015-09-07 (×3): qty 1

## 2015-09-07 MED ORDER — METOCLOPRAMIDE HCL 5 MG/ML IJ SOLN: 5.0000 mg | Freq: Three times a day (TID) | INTRAMUSCULAR | Status: DC | PRN

## 2015-09-07 MED ORDER — ONDANSETRON HCL 4 MG PO TABS: 4.0000 mg | ORAL_TABLET | Freq: Four times a day (QID) | ORAL | Status: DC | PRN

## 2015-09-07 MED ORDER — METOCLOPRAMIDE HCL 10 MG PO TABS: 5.0000 mg | ORAL_TABLET | Freq: Three times a day (TID) | ORAL | Status: DC | PRN

## 2015-09-07 MED ORDER — CEFAZOLIN SODIUM 1-5 GM-% IV SOLN
1.0000 g | Freq: Four times a day (QID) | INTRAVENOUS | Status: AC
  Administered 2015-09-07 – 2015-09-08 (×2): 1 g via INTRAVENOUS
  Filled 2015-09-07 (×3): qty 50

## 2015-09-07 MED ORDER — PROPOFOL 10 MG/ML IV BOLUS
INTRAVENOUS | Status: DC | PRN
Start: 2015-09-07 — End: 2015-09-07
  Administered 2015-09-07: 150 mg via INTRAVENOUS

## 2015-09-07 MED ORDER — PROPOFOL 500 MG/50ML IV EMUL
INTRAVENOUS | Status: DC | PRN
  Administered 2015-09-07: 200 ug/kg/min via INTRAVENOUS

## 2015-09-07 MED ORDER — ONDANSETRON HCL 4 MG/2ML IJ SOLN
INTRAMUSCULAR | Status: DC | PRN
  Administered 2015-09-07: 4 mg via INTRAVENOUS

## 2015-09-07 MED ORDER — ONDANSETRON HCL 4 MG/2ML IJ SOLN: 4.0000 mg | Freq: Once | INTRAMUSCULAR | Status: DC | PRN

## 2015-09-07 MED ORDER — BUPIVACAINE IN DEXTROSE 0.75-8.25 % IT SOLN
INTRATHECAL | Status: DC | PRN
  Administered 2015-09-07: 2 mL via INTRATHECAL

## 2015-09-07 SURGICAL SUPPLY — 45 items
APL SKNCLS STERI-STRIP NONHPOA (GAUZE/BANDAGES/DRESSINGS) ×1
BAG SPEC THK2 15X12 ZIP CLS (MISCELLANEOUS)
BAG ZIPLOCK 12X15 (MISCELLANEOUS) IMPLANT
BENZOIN TINCTURE PRP APPL 2/3 (GAUZE/BANDAGES/DRESSINGS) ×2 IMPLANT
BLADE SAW SGTL 18X1.27X75 (BLADE) ×2 IMPLANT
BLADE SAW SGTL 18X1.27X75MM (BLADE) ×1
CAPT HIP TOTAL 2 ×2 IMPLANT
CELLS DAT CNTRL 66122 CELL SVR (MISCELLANEOUS) ×1 IMPLANT
CLOSURE WOUND 1/2 X4 (GAUZE/BANDAGES/DRESSINGS) ×1
COVER PERINEAL POST (MISCELLANEOUS) ×3 IMPLANT
DRAPE C-ARM 42X120 X-RAY (DRAPES) ×3 IMPLANT
DRAPE STERI IOBAN 125X83 (DRAPES) ×3 IMPLANT
DRAPE U-SHAPE 47X51 STRL (DRAPES) ×9 IMPLANT
DRSG AQUACEL AG ADV 3.5X10 (GAUZE/BANDAGES/DRESSINGS) ×3 IMPLANT
DURAPREP 26ML APPLICATOR (WOUND CARE) ×3 IMPLANT
ELECT BLADE TIP CTD 4 INCH (ELECTRODE) ×3 IMPLANT
ELECT REM PT RETURN 9FT ADLT (ELECTROSURGICAL) ×3
ELECTRODE REM PT RTRN 9FT ADLT (ELECTROSURGICAL) ×1 IMPLANT
FACESHIELD WRAPAROUND (MASK) ×12 IMPLANT
FACESHIELD WRAPAROUND OR TEAM (MASK) ×4 IMPLANT
GAUZE XEROFORM 1X8 LF (GAUZE/BANDAGES/DRESSINGS) IMPLANT
GLOVE BIO SURGEON STRL SZ7.5 (GLOVE) ×3 IMPLANT
GLOVE BIOGEL PI IND STRL 8 (GLOVE) ×2 IMPLANT
GLOVE BIOGEL PI INDICATOR 8 (GLOVE) ×4
GLOVE ECLIPSE 8.0 STRL XLNG CF (GLOVE) ×3 IMPLANT
GOWN STRL REUS W/TWL XL LVL3 (GOWN DISPOSABLE) ×6 IMPLANT
HANDPIECE INTERPULSE COAX TIP (DISPOSABLE) ×3
KIT BASIN OR (CUSTOM PROCEDURE TRAY) ×3 IMPLANT
PACK TOTAL JOINT (CUSTOM PROCEDURE TRAY) ×3 IMPLANT
PEN SKIN MARKING BROAD (MISCELLANEOUS) ×3 IMPLANT
RETRACTOR WND ALEXIS 18 MED (MISCELLANEOUS) ×1 IMPLANT
RTRCTR WOUND ALEXIS 18CM MED (MISCELLANEOUS) ×3
SET HNDPC FAN SPRY TIP SCT (DISPOSABLE) ×1 IMPLANT
STAPLER VISISTAT 35W (STAPLE) IMPLANT
STRIP CLOSURE SKIN 1/2X4 (GAUZE/BANDAGES/DRESSINGS) ×1 IMPLANT
SUT ETHIBOND NAB CT1 #1 30IN (SUTURE) ×3 IMPLANT
SUT MNCRL AB 4-0 PS2 18 (SUTURE) ×2 IMPLANT
SUT VIC AB 0 CT1 36 (SUTURE) ×3 IMPLANT
SUT VIC AB 1 CT1 36 (SUTURE) ×3 IMPLANT
SUT VIC AB 2-0 CT1 27 (SUTURE) ×6
SUT VIC AB 2-0 CT1 TAPERPNT 27 (SUTURE) ×2 IMPLANT
TOWEL OR 17X26 10 PK STRL BLUE (TOWEL DISPOSABLE) ×3 IMPLANT
TOWEL OR NON WOVEN STRL DISP B (DISPOSABLE) ×3 IMPLANT
TRAY FOLEY W/METER SILVER 14FR (SET/KITS/TRAYS/PACK) ×3 IMPLANT
YANKAUER SUCT BULB TIP 10FT TU (MISCELLANEOUS) ×3 IMPLANT

## 2015-09-07 NOTE — Anesthesia Procedure Notes (Addendum)
Spinal Patient location during procedure: OR Staffing Anesthesiologist: JUDD, BENJAMIN Performed by: anesthesiologist  Preanesthetic Checklist Completed: patient identified, site marked, surgical consent, pre-op evaluation, timeout performed, IV checked, risks and benefits discussed and monitors and equipment checked Spinal Block Patient position: sitting Prep: Betadine Patient monitoring: heart rate, continuous pulse ox and blood pressure Location: L4-5 Injection technique: single-shot Needle Needle type: Quincke  Needle gauge: 22 G Needle length: 9 cm Additional Notes Expiration date of kit checked and confirmed. Patient tolerated procedure well, without complications.    Procedure Name: LMA Insertion Date/Time: 09/07/2015 11:00 AM Performed by: PETERS, LAURA J Pre-anesthesia Checklist: Patient identified, Emergency Drugs available, Suction available and Patient being monitored Patient Re-evaluated:Patient Re-evaluated prior to inductionOxygen Delivery Method: Circle system utilized Preoxygenation: Pre-oxygenation with 100% oxygen Intubation Type: IV induction LMA: LMA inserted LMA Size: 4.0 Number of attempts: 1 Placement Confirmation: positive ETCO2,  CO2 detector and breath sounds checked- equal and bilateral Tube secured with: Tape Dental Injury: Teeth and Oropharynx as per pre-operative assessment      

## 2015-09-07 NOTE — Progress Notes (Signed)
Portable AP Pelvis and Lateral Left Hip X-rays done. 

## 2015-09-07 NOTE — Brief Op Note (Signed)
09/07/2015  11:47 AM  PATIENT:  Bianca Ware  69 y.o. female  PRE-OPERATIVE DIAGNOSIS:  Osteoarthritis left hip  POST-OPERATIVE DIAGNOSIS:  Osteoarthritis left hip  PROCEDURE:  Procedure(s): LEFT TOTAL HIP ARTHROPLASTY ANTERIOR APPROACH (Left)  SURGEON:  Surgeon(s) and Role:    * Kathryne Hitchhristopher Y Blackman, MD - Primary  PHYSICIAN ASSISTANT: Rexene EdisonGil Clark, PA-C  ANESTHESIA:   spinal and general  EBL:  Total I/O In: 1000 [I.V.:1000] Out: 750 [Urine:500; Blood:250]  BLOOD ADMINISTERED:none  DRAINS: none   LOCAL MEDICATIONS USED:  NONE  SPECIMEN:  No Specimen  DISPOSITION OF SPECIMEN:  N/A  COUNTS:  YES  TOURNIQUET:  * No tourniquets in log *  DICTATION: .Other Dictation: Dictation Number [id number]  PLAN OF CARE: Admit to inpatient   PATIENT DISPOSITION:  PACU - hemodynamically stable.   Delay start of Pharmacological VTE agent (>24hrs) due to surgical blood loss or risk of bleeding: no

## 2015-09-07 NOTE — Transfer of Care (Signed)
Immediate Anesthesia Transfer of Care Note  Patient: Bianca Ware  Procedure(s) Performed: Procedure(s): LEFT TOTAL HIP ARTHROPLASTY ANTERIOR APPROACH (Left)  Patient Location: PACU  Anesthesia Type:General  Level of Consciousness: awake, alert  and oriented  Airway & Oxygen Therapy: Patient Spontanous Breathing and Patient connected to face mask oxygen  Post-op Assessment: Report given to RN and Post -op Vital signs reviewed and stable  Post vital signs: Reviewed and stable  Last Vitals:  Filed Vitals:   09/07/15 0800  BP: 171/69  Pulse: 71  Temp: 36.8 C  Resp: 16    Complications: No apparent anesthesia complications

## 2015-09-07 NOTE — Anesthesia Preprocedure Evaluation (Addendum)
Anesthesia Evaluation  Patient identified by MRN, date of birth, ID band Patient awake    Reviewed: Allergy & Precautions, NPO status , Patient's Chart, lab work & pertinent test results  History of Anesthesia Complications (+) PONV and history of anesthetic complications  Airway Mallampati: II  TM Distance: >3 FB Neck ROM: Full    Dental no notable dental hx.    Pulmonary former smoker,    breath sounds clear to auscultation       Cardiovascular negative cardio ROS   Rhythm:Regular Rate:Normal     Neuro/Psych negative neurological ROS     GI/Hepatic negative GI ROS, Neg liver ROS,   Endo/Other  negative endocrine ROS  Renal/GU negative Renal ROS     Musculoskeletal  (+) Arthritis ,   Abdominal   Peds  Hematology negative hematology ROS (+)   Anesthesia Other Findings   Reproductive/Obstetrics                            Anesthesia Physical  Anesthesia Plan  ASA: II  Anesthesia Plan: Spinal   Post-op Pain Management:    Induction: Intravenous  Airway Management Planned: Simple Face Mask  Additional Equipment:   Intra-op Plan:   Post-operative Plan: Extubation in OR  Informed Consent: I have reviewed the patients History and Physical, chart, labs and discussed the procedure including the risks, benefits and alternatives for the proposed anesthesia with the patient or authorized representative who has indicated his/her understanding and acceptance.   Dental advisory given  Plan Discussed with: CRNA and Anesthesiologist  Anesthesia Plan Comments:         Anesthesia Quick Evaluation

## 2015-09-07 NOTE — H&P (Signed)
TOTAL HIP ADMISSION H&P  Patient is admitted for left total hip arthroplasty.  Subjective:  Chief Complaint: left hip pain  HPI: Bianca Ware, 69 y.o. female, has a history of pain and functional disability in the left hip(s) due to arthritis and patient has failed non-surgical conservative treatments for greater than 12 weeks to include NSAID's and/or analgesics, corticosteriod injections, flexibility and strengthening excercises, supervised PT with diminished ADL's post treatment, use of assistive devices, weight reduction as appropriate and activity modification.  Onset of symptoms was gradual starting 5 years ago with gradually worsening course since that time.The patient noted no past surgery on the left hip(s).  Patient currently rates pain in the left hip at 10 out of 10 with activity. Patient has night pain, worsening of pain with activity and weight bearing, trendelenberg gait, pain that interfers with activities of daily living, pain with passive range of motion and crepitus. Patient has evidence of subchondral cysts, subchondral sclerosis, periarticular osteophytes and joint space narrowing by imaging studies. This condition presents safety issues increasing the risk of falls.  There is no current active infection.  Patient Active Problem List   Diagnosis Date Noted  . Osteoarthritis of left hip 09/07/2015  . Lumbar disc herniation with radiculopathy 05/29/2015    Class: Acute  . Spondylolisthesis of lumbar region 05/29/2015    Class: Chronic   Past Medical History  Diagnosis Date  . Complication of anesthesia     waking up  . Arthritis   . PONV (postoperative nausea and vomiting)     Past Surgical History  Procedure Laterality Date  . Tonsillectomy    . Abdominal hysterectomy    . Dilation and curettage of uterus    . Tubal ligation    . Lumbar laminectomy N/A 05/29/2015    Procedure: LEFT L4-5 MICRODISCECTOMY;  Surgeon: Kerrin Champagne, MD;  Location: [facility] OR;  Service:  Orthopedics;  Laterality: N/A;    No prescriptions prior to admission   No Known Allergies  Social History  Substance Use Topics  . Smoking status: Former Smoker -- 0.25 packs/day for 15 years    Types: Cigarettes    Quit date: 05/24/1977  . Smokeless tobacco: Never Used  . Alcohol Use: No    No family history on file.   Review of Systems  Musculoskeletal: Positive for back pain and joint pain.  All other systems reviewed and are negative.   Objective:  Physical Exam  Constitutional: She is oriented to person, place, and time. She appears well-developed and well-nourished.  HENT:  Head: Normocephalic and atraumatic.  Eyes: EOM are normal. Pupils are equal, round, and reactive to light.  Neck: Normal range of motion. Neck supple.  Cardiovascular: Normal rate and regular rhythm.   Respiratory: Effort normal and breath sounds normal.  GI: Soft. Bowel sounds are normal.  Musculoskeletal:       Left hip: She exhibits decreased range of motion, decreased strength, tenderness and bony tenderness.  Neurological: She is alert and oriented to person, place, and time.  Skin: Skin is warm and dry.  Psychiatric: She has a normal mood and affect.    Vital signs in last 24 hours:    Labs:   Estimated body mass index is 27.89 kg/(m^2) as calculated from the following:   Height as of 05/25/15:  (1.702 m).   Weight as of 05/25/15: 80.8 kg (178 lb 2.1 oz).   Imaging Review Plain radiographs demonstrate severe degenerative joint disease of the left hip(s).  The bone quality appears to be good for age and reported activity level.  Assessment/Plan:  End stage arthritis, left hip(s)  The patient history, physical examination, clinical judgement of the provider and imaging studies are consistent with end stage degenerative joint disease of the left hip(s) and total hip arthroplasty is deemed medically necessary. The treatment options including medical management, injection therapy,  arthroscopy and arthroplasty were discussed at length. The risks and benefits of total hip arthroplasty were presented and reviewed. The risks due to aseptic loosening, infection, stiffness, dislocation/subluxation,  thromboembolic complications and other imponderables were discussed.  The patient acknowledged the explanation, agreed to proceed with the plan and consent was signed. Patient is being admitted for inpatient treatment for surgery, pain control, PT, OT, prophylactic antibiotics, VTE prophylaxis, progressive ambulation and ADL's and discharge planning.The patient is planning to be discharged home with home health services

## 2015-09-07 NOTE — Anesthesia Postprocedure Evaluation (Signed)
  Anesthesia Post-op Note  Patient: Bianca Ware  Procedure(s) Performed: Procedure(s) (LRB): LEFT TOTAL HIP ARTHROPLASTY ANTERIOR APPROACH (Left)  Patient Location: PACU  Anesthesia Type: General  Level of Consciousness: awake and alert   Airway and Oxygen Therapy: Patient Spontanous Breathing  Post-op Pain: mild  Post-op Assessment: Post-op Vital signs reviewed, Patient's Cardiovascular Status Stable, Respiratory Function Stable, Patent Airway and No signs of Nausea or vomiting  Last Vitals:  Filed Vitals:   09/07/15 1330  BP: 157/72  Pulse: 87  Temp: 36.3 C  Resp: 12    Post-op Vital Signs: stable   Complications: No apparent anesthesia complications

## 2015-09-07 NOTE — Progress Notes (Signed)
Pt was admitted to 6E19 from PACU. Admision VS is stable. Pt is AOX4 with no deficit. Pt stated she has tingling in both legs.

## 2015-09-07 NOTE — Progress Notes (Signed)
X-ray results noted 

## 2015-09-08 LAB — CBC
HEMATOCRIT: 34.5 % — AB (ref 36.0–46.0)
HEMOGLOBIN: 11.3 g/dL — AB (ref 12.0–15.0)
MCH: 28.8 pg (ref 26.0–34.0)
MCHC: 32.8 g/dL (ref 30.0–36.0)
MCV: 87.8 fL (ref 78.0–100.0)
Platelets: 221 10*3/uL (ref 150–400)
RBC: 3.93 MIL/uL (ref 3.87–5.11)
RDW: 13.4 % (ref 11.5–15.5)
WBC: 11 10*3/uL — ABNORMAL HIGH (ref 4.0–10.5)

## 2015-09-08 LAB — BASIC METABOLIC PANEL
ANION GAP: 8 (ref 5–15)
BUN: 12 mg/dL (ref 6–20)
CHLORIDE: 101 mmol/L (ref 101–111)
CO2: 26 mmol/L (ref 22–32)
CREATININE: 0.59 mg/dL (ref 0.44–1.00)
Calcium: 8.9 mg/dL (ref 8.9–10.3)
GFR calc non Af Amer: >60 mL/min (ref >60)
GLUCOSE: 133 mg/dL — AB (ref 65–99)
Potassium: 3.9 mmol/L (ref 3.5–5.1)
Sodium: 135 mmol/L (ref 135–145)

## 2015-09-08 NOTE — Progress Notes (Signed)
Occupational Therapy Evaluation Patient Details Name: Bianca Ware MRN: [id number] DOB: 10-Oct-1946 Today's Date: 09/08/2015    History of Present Illness Pt s/p L THR   Clinical Impression   Patient educated in all ADL techniques and verbalized understanding. No further OT needs and patient plans to d/c home with husband, who will assist her as needed. OT will sign off.    Follow Up Recommendations  No OT follow up;Supervision/Assistance - 24 hour    Equipment Recommendations  3 in 1 bedside comode    Recommendations for Other Services PT consult     Precautions / Restrictions Precautions Precautions: Fall Restrictions Weight Bearing Restrictions: No Other Position/Activity Restrictions: WBAT      Mobility Bed Mobility               General bed mobility comments: NT - pt  OOB with nursing  Transfers Overall transfer level: Needs assistance Equipment used: None Transfers: Sit to/from Stand Sit to Stand: Supervision              Balance                                            ADL Overall ADL's : Needs assistance/impaired Eating/Feeding: Independent;Sitting   Grooming: Wash/dry hands;Supervision/safety;Standing   Upper Body Bathing: Set up;Sitting   Lower Body Bathing: Minimal assistance;Sit to/from stand   Upper Body Dressing : Set up;Sitting   Lower Body Dressing: Minimal assistance;Sit to/from stand   Toilet Transfer: Supervision/safety;Ambulation;BSC;RW   Toileting- Clothing Manipulation and Hygiene: Supervision/safety;Sit to/from stand   Tub/ Shower Transfer: Walk-in shower;Supervision/safety;Shower seat;Grab bars;Rolling walker   Functional mobility during ADLs: Supervision/safety;Rolling walker General ADL Comments: Patient educated on all ADL techniques during this session. Husband will assist with LB self-care as needed. Patient reports he had to assist occasionally PTA. Patient ambulated with RW to bathroom  where she toileted and practiced shower transfer. No further OT needs at this time. Will sign off.     Vision     Perception     Praxis      Pertinent Vitals/Pain Pain Assessment: 0-10 Pain Score: 3  Pain Location: L hip Pain Descriptors / Indicators: Aching;Sore Pain Intervention(s): Limited activity within patient's tolerance;Monitored during session     Hand Dominance Right   Extremity/Trunk Assessment Upper Extremity Assessment Upper Extremity Assessment: Overall WFL for tasks assessed   Lower Extremity Assessment Lower Extremity Assessment: Defer to PT evaluation   Cervical / Trunk Assessment Cervical / Trunk Assessment: Normal   Communication Communication Communication: No difficulties   Cognition Arousal/Alertness: Awake/alert Behavior During Therapy: WFL for tasks assessed/performed Overall Cognitive Status: Within Functional Limits for tasks assessed                     General Comments       Exercises       Shoulder Instructions      Home Living Family/patient expects to be discharged to:: Private residence Living Arrangements: Spouse/significant other Available Help at Discharge: Family;Available 24 hours/day Type of Home: House Home Access: Stairs to enter [employer] of Steps: 2+1 Entrance Stairs-Rails: Right;Left Home Layout: One level     Bathroom Shower/Tub: Producer, television/film/video: Handicapped height Bathroom Accessibility: Yes How Accessible: Accessible via walker Home Equipment: Walker - 2 wheels;Cane - single point;Grab bars - tub/shower;Shower seat - built in  Prior Functioning/Environment Level of Independence: Independent;Independent with assistive device(s)             OT Diagnosis: Acute pain   OT Problem List: Decreased strength;Decreased range of motion;Pain   OT Treatment/Interventions:      OT Goals(Current goals can be found in the care plan section) Acute Rehab OT  Goals Patient Stated Goal: Walk without pain and do the things I want to OT Goal Formulation: All assessment and education complete, DC therapy  OT Frequency:     Barriers to D/C:            Co-evaluation              End of Session Equipment Utilized During Treatment: Rolling walker  Activity Tolerance: Patient tolerated treatment well Patient left: in chair;with call bell/phone within reach;with family/visitor present   Time: 8657-84691148-1201 OT Time Calculation (min): 13 min Charges:  OT General Charges $OT Visit: 1 Procedure OT Evaluation $Initial OT Evaluation Tier I: 1 Procedure G-Codes:    Ocean Kearley A 09/08/2015, 1:48 PM

## 2015-09-08 NOTE — Progress Notes (Signed)
Subjective: 1 Day Post-Op Procedure(s) (LRB): LEFT TOTAL HIP ARTHROPLASTY ANTERIOR APPROACH (Left) Patient reports pain as moderate.  Minimal acute blood loss anemia, but stable vitals.  Some nausea.  Objective: Vital signs in last 24 hours: Temp:  [97.4 F (36.3 C)-98.2 F (36.8 C)] 98 F (36.7 C) (10/15 0545) Pulse Rate:  [69-94] 78 (10/15 0545) Resp:  [9-19] 16 (10/15 0545) BP: (123-171)/(51-83) 123/51 mmHg (10/15 0545) SpO2:  [96 %-100 %] 99 % (10/15 0545) Weight:  [82.101 kg (181 lb)] 82.101 kg (181 lb) (10/14 0854)  Intake/Output from previous day: 10/14 0701 - 10/15 0700 In: 3716.3 [P.O.:600; I.V.:3061.3; IV Piggyback:55] Out: 2300 [Urine:2050; Blood:250] Intake/Output this shift:     Recent Labs  09/08/15 0445  HGB 11.3*    Recent Labs  09/08/15 0445  WBC 11.0*  RBC 3.93  HCT 34.5*  PLT 221    Recent Labs  09/08/15 0445  NA 135  K 3.9  CL 101  CO2 26  BUN 12  CREATININE 0.59  GLUCOSE 133*  CALCIUM 8.9   No results for input(s): LABPT, INR in the last 72 hours.  Sensation intact distally Intact pulses distally Dorsiflexion/Plantar flexion intact Incision: dressing C/D/I  Assessment/Plan: 1 Day Post-Op Procedure(s) (LRB): LEFT TOTAL HIP ARTHROPLASTY ANTERIOR APPROACH (Left) Up with therapy Plan for discharge tomorrow Discharge home with home health  Kathryne HitchBLACKMAN,CHRISTOPHER Y 09/08/2015, 7:26 AM

## 2015-09-08 NOTE — Evaluation (Signed)
Physical Therapy Evaluation Patient Details Name: Bianca Ware MRN: [id number] DOB: 05/11/1946 Today's Date: 09/08/2015   History of Present Illness  Pt s/p L THR  Clinical Impression  Pt s/p L THR presents with decreased L LE strength/ROM and post op pain limiting functional mobility.  Pt should progress well to dc home with family assist.    Follow Up Recommendations Home health PT    Equipment Recommendations       Recommendations for Other Services OT consult     Precautions / Restrictions Precautions Precautions: Fall Restrictions Weight Bearing Restrictions: No Other Position/Activity Restrictions: WBAT      Mobility  Bed Mobility               General bed mobility comments: NT - pt  OOB with nursing  Transfers Overall transfer level: Needs assistance Equipment used: None Transfers: Sit to/from Stand Sit to Stand: Min assist         General transfer comment: cues for LE management and use of UEs to self assist  Ambulation/Gait Ambulation/Gait assistance: Min assist Ambulation Distance (Feet): 180 Feet Assistive device: Rolling walker (2 wheeled) Gait Pattern/deviations: Step-to pattern;Step-through pattern;Decreased step length - right;Decreased step length - left;Shuffle;Trunk flexed     General Gait Details: cues for posture, position from RW and initial sequence  Stairs            Wheelchair Mobility    Modified Rankin (Stroke Patients Only)       Balance                                             Pertinent Vitals/Pain Pain Assessment: 0-10 Pain Score: 3  Pain Location: L hip Pain Descriptors / Indicators: Aching;Sore Pain Intervention(s): Limited activity within patient's tolerance;Monitored during session;Premedicated before session;Ice applied    Home Living Family/patient expects to be discharged to:: Private residence Living Arrangements: Spouse/significant other Available Help at Discharge:  Family;Available 24 hours/day Type of Home: House Home Access: Stairs to enter Entrance Stairs-Rails: Doctor, general practiceight;Left Entrance Stairs-Number of Steps: 2+1 Home Layout: One level Home Equipment: Environmental consultantWalker - 2 wheels;Cane - single point      Prior Function Level of Independence: Independent;Independent with assistive device(s)               Hand Dominance        Extremity/Trunk Assessment   Upper Extremity Assessment: Overall WFL for tasks assessed           Lower Extremity Assessment: LLE deficits/detail   LLE Deficits / Details: 2+/5 strength at hip with AAROM at hip to 80 flex and 15 abd  Cervical / Trunk Assessment: Normal  Communication   Communication: No difficulties  Cognition Arousal/Alertness: Awake/alert Behavior During Therapy: WFL for tasks assessed/performed Overall Cognitive Status: Within Functional Limits for tasks assessed                      General Comments      Exercises Total Joint Exercises Ankle Circles/Pumps: AROM;Both;15 reps;Supine Quad Sets: AROM;Both;10 reps;Supine Heel Slides: AAROM;20 reps;Supine;Left Hip ABduction/ADduction: AAROM;Left;15 reps;Supine      Assessment/Plan    PT Assessment Patient needs continued PT services  PT Diagnosis Difficulty walking   PT Problem List Decreased strength;Decreased range of motion;Decreased activity tolerance;Decreased mobility;Decreased knowledge of use of DME;Pain  PT Treatment Interventions DME instruction;Gait training;Stair training;Functional mobility training;Therapeutic activities;Therapeutic  exercise;Patient/family education   PT Goals (Current goals can be found in the Care Plan section) Acute Rehab PT Goals Patient Stated Goal: Walk without pain and do the things I want to PT Goal Formulation: With patient Time For Goal Achievement: 09/11/15 Potential to Achieve Goals: Good    Frequency 7X/week   Barriers to discharge        Co-evaluation               End  of Session Equipment Utilized During Treatment: Gait belt Activity Tolerance: Patient tolerated treatment well Patient left: in chair;with call bell/phone within reach;with family/visitor present Nurse Communication: Mobility status         Time: 9562-1308 PT Time Calculation (min) (ACUTE ONLY): 32 min   Charges:   PT Evaluation $Initial PT Evaluation Tier I: 1 Procedure PT Treatments $Therapeutic Exercise: 8-22 mins   PT G Codes:        Beverlyann Broxterman 06-Oct-2015, 12:17 PM

## 2015-09-08 NOTE — Progress Notes (Signed)
Her pulse was 76. 16 was her resp.

## 2015-09-08 NOTE — Care Management Note (Signed)
Case Management Note  Patient Details  Name: Christi K Appling MRN: [id number] Date of Birth: 12/02/1945  Subjective/Objective:     Left total hip arthroplasty               ActionLance Sell/Plan: N[facility] pt [facility] requesting [facility] for home. States she has RW at home. Contacted [facility] for delivery of 3n1 for home.   Expected Discharge Date:  09/09/2015               Expected Discharge Plan:  Home w Home Health Services  In-House Referral:     Discharge planning Services  CM Consult  Post Acute Care Choice:  Home Health Choice offered to:  Patient  DME Arranged:  3-N-1 DME Agency:  [facility]  [facility]  [facility]  Status of Service:     Medicare Important Message Given:    Date Medicare IM Given:    Medicare IM give by:    Date Additional Medicare IM Given:    Additional Medicare Important Message give by:     If discussed at Long Length of Stay Meetings, dates discussed:    Additional Comments:  Elliot CousinShavis, Eular Panek Ellen, RN 09/08/2015, 11:03 AM

## 2015-09-08 NOTE — Progress Notes (Signed)
Physical Therapy Treatment Patient Details Name: Bianca Ware Billing MRN: [id number] DOB: 11/04/1946 Today's Date: 09/08/2015    History of Present Illness Pt s/p L THR    PT Comments    Pt progressing well with mobility and hopeful for dc tomorrow.  Follow Up Recommendations  Home health PT     Equipment Recommendations       Recommendations for Other Services OT consult     Precautions / Restrictions Precautions Precautions: Fall Restrictions Weight Bearing Restrictions: No Other Position/Activity Restrictions: WBAT    Mobility  Bed Mobility               General bed mobility comments: Pt declines back to bed  Transfers Overall transfer level: Needs assistance Equipment used: None Transfers: Sit to/from Stand Sit to Stand: Supervision         General transfer comment: cues for LE management and use of UEs to self assist  Ambulation/Gait Ambulation/Gait assistance: Min guard;Supervision Ambulation Distance (Feet): 400 Feet Assistive device: Rolling walker (2 wheeled) Gait Pattern/deviations: Step-to pattern;Step-through pattern;Decreased step length - right;Decreased step length - left;Shuffle;Trunk flexed     General Gait Details: cues for posture, position from RW and initial sequence   Stairs            Wheelchair Mobility    Modified Rankin (Stroke Patients Only)       Balance                                    Cognition Arousal/Alertness: Awake/alert Behavior During Therapy: WFL for tasks assessed/performed Overall Cognitive Status: Within Functional Limits for tasks assessed                      Exercises Total Joint Exercises Ankle Circles/Pumps: AROM;Both;15 reps;Supine Quad Sets: AROM;Both;10 reps;Supine Heel Slides: AAROM;20 reps;Supine;Left Hip ABduction/ADduction: AAROM;Left;15 reps;Supine    General Comments        Pertinent Vitals/Pain Pain Assessment: 0-10 Pain Score: 2  Pain  Location: L hip Pain Descriptors / Indicators: Aching;Sore Pain Intervention(s): Limited activity within patient's tolerance;Monitored during session;Premedicated before session;Ice applied    Home Living Family/patient expects to be discharged to:: Private residence Living Arrangements: Spouse/significant other Available Help at Discharge: Family;Available 24 hours/day Type of Home: House Home Access: Stairs to enter Entrance Stairs-Rails: Right;Left Home Layout: One level Home Equipment: Environmental consultantWalker - 2 wheels;Cane - single point;Grab bars - tub/shower;Shower seat - built in      Prior Function Level of Independence: Independent;Independent with assistive device(s)          PT Goals (current goals can now be found in the care plan section) Acute Rehab PT Goals Patient Stated Goal: Walk without pain and do the things I want to PT Goal Formulation: With patient Time For Goal Achievement: 09/11/15 Potential to Achieve Goals: Good Progress towards PT goals: Progressing toward goals    Frequency  7X/week    PT Plan Current plan remains appropriate    Co-evaluation             End of Session Equipment Utilized During Treatment: Gait belt Activity Tolerance: Patient tolerated treatment well Patient left: in chair;with call bell/phone within reach;with family/visitor present     Time: 1317-1340 PT Time Calculation (min) (ACUTE ONLY): 23 min  Charges:  $Gait Training: 8-22 mins $Therapeutic Exercise: 8-22 mins  G Codes:      Natayla Cadenhead Oct 06, 2015, 3:43 PM

## 2015-09-08 NOTE — Op Note (Signed)
NAMEEWELINA, NAVES NO.:  [id number]  MEDICAL RECORD NO.:  [id number]  LOCATION:  1619                         FACILITY:  [facility]  PHYSICIAN:  Vanita Panda. Magnus Ivan, M.D.DATE OF BIRTH:  07-Apr-1946  DATE OF PROCEDURE:  09/07/2015 DATE OF DISCHARGE:                              OPERATIVE REPORT   PREOPERATIVE DIAGNOSIS:  Primary osteoarthritis and degenerative joint disease, left hip.  POSTOPERATIVE DIAGNOSIS:  Primary osteoarthritis and degenerative joint disease, left hip.  PROCEDURE:  Left total hip arthroplasty through direct anterior approach.  IMPLANTS:  DePuy Sector Gription acetabular component size 52, size 36+ 0 neutral polyethylene liner, size 13 Corail femoral component with standard offset, size 36+ 5 ceramic hip ball.  SURGEON:  Vanita Panda. Magnus Ivan, MD  ASSISTANT:  Richardean Canal, PA-C  ANESTHESIA: 1. Attempted spinal. 2. General.  ANTIBIOTICS:  2 g IV Ancef.  BLOOD LOSS:  250-300 mL.  COMPLICATIONS:  None.  INDICATIONS:  Ms. Stauder is a very pleasant 69 year old female with debilitating arthritis involving her left hip.  Her x-ray showed complete loss of the joint space.  Her leg is shorter on the left when comparing the left and right hips.  Her x-rays show complete loss of the joint space, shortening of her left hip, comparing the left hip on the right, there was periarticular osteophytes and sclerotic and cystic changes.  She has tried and failed all forms of conservative treatment. At this point, due to the detriment she has had on her activities of daily living, her quality of life, and her mobility due to her left hip pain, she wished to proceed with a total hip arthroplasty through direct anterior approach.  She understands the risk of acute blood loss anemia, nerve and vessel injury, fracture, infection, dislocation, DVT.  She understands her goals are decreased pain, improved mobility, and overall good quality of  life.  PROCEDURE DESCRIPTION:  After informed consent was obtained, appropriate left hip was marked.  She was brought to the operating room.  Spinal anesthesia was obtained while she was on her stretcher.  She was laid in a supine position on the stretcher.  A Foley catheter was placed and both feet had traction boots applied to them.  Next, she was placed supine on the Hana fracture table with a perineal post in place and both legs in inline skeletal traction devices, but no traction applied.  Her left operative hip was then prepped and draped with DuraPrep and sterile drapes.  A time-out was called.  She was identified as correct patient, correct left hip.  I then made an incision, and then since I made the incision, she felt a little uncomfortable, so she decided to proceed with general anesthesia.  Once general anesthesia via an LMA was obtained, we then proceeded with surgery.  We made incision just inferior and posterior to the anterosuperior iliac spine and carried this obliquely down the leg.  We dissected down to the tensor fascia lata.  Muscle and tensor fascia was then divided longitudinally, so we could proceed with a direct anterior approach to the hip.  We identified and cauterized the lateral femoral circumflex vessels and then identified the hip capsule placing Cobra retractor  on the lateral medial femoral neck.  We then opened up the hip capsule in L type format finding a minimal joint effusion due to the severe loss or joint space. We placed Cobra retractors within the joint capsule and then made our femoral neck cut just proximal to the lesser trochanter using an oscillating saw and completed this on osteotome.  I placed a corkscrew guide in the femoral head and removed the femoral head in it's entirety and found to be completely devoid of cartilage.  We then cleaned the acetabulum and remnants of acetabular labrum and debris, placed a bent Hohmann over the medial  acetabular rim and then began reaming from a size 42 reamer up to a size 52, with all reamers under direct visualization and last reamer under direct fluoroscopy, so I could obtain the depth of the reamer, inclination and anteversion was replaced with this.  We placed the real DePuy Sector Gription acetabular component with size 52, and a 32+ 0 neutral polyethylene liner for a size 52 acetabular component.  Attention was then turned to the femur. With the leg externally rotated to about 100 degrees extended and adducted, I was able to place bent Hohmann medially and Hohmann retractor behind the greater trochanter.  I released the lateral joint capsule and then used a box cutting osteotome, then entered the femoral canal and rongeur to lateralize.  I then began broaching from a size 8 broach using Corail broaching system up to a size 13, with a size 13 in place, we trialed a standard neck and a 36+ 1.5 hip ball and brought the leg back over and up with traction and internal rotation.  We reduced the hip into the pelvis.  We were pleased with range of motion offset, but I felt like she had a little more leg length.  We then dislocated the hip and placed the real Corail femoral component size 13 and the real 36+ 5 ceramic hip ball to get more offset leg length.  We reduced this in the acetabulum.  I was very much pleased with stability of leg length, offset, and range of motion.  We then irrigated the soft tissue with normal saline solution using pulsatile lavage and closed the joint capsule with interrupted #1 Ethibond suture following running #1 Vicryl in the tensor fascia, 0 Vicryl in the deep tissue, 2-0 Vicryl in subcutaneous tissue, 4-0 Monocryl subcuticular stitch, and Steri-Strips on the skin.  An Aquacel dressing was applied.  She was then taken off the Hana table, awakened and extubated and taken to recovery room in stable condition.  All final counts were correct.  There were  no complications noted.  Of note, Richardean CanalGilbert Clark, PA-C assisted in entire case, assistance was crucial for facilitating all aspects of this case.     Vanita Pandahristopher Y. Magnus IvanBlackman, M.D.     CYB/MEDQ  D:  09/07/2015  T:  09/08/2015  Job:  [id number]

## 2015-09-09 MED ORDER — ASPIRIN 325 MG PO TBEC: 325.0000 mg | DELAYED_RELEASE_TABLET | Freq: Two times a day (BID) | ORAL | Status: DC

## 2015-09-09 MED ORDER — HYDROCODONE-ACETAMINOPHEN 5-325 MG PO TABS: 1.0000 | ORAL_TABLET | ORAL | Status: DC | PRN

## 2015-09-09 MED ORDER — TIZANIDINE HCL 2 MG PO TABS: 2.0000 mg | ORAL_TABLET | Freq: Four times a day (QID) | ORAL | Status: DC | PRN

## 2015-09-09 NOTE — Progress Notes (Signed)
Physical Therapy Treatment Patient Details Name: Bianca Ware MRN: [id number] DOB: 09/18/1946 Today's Date: 09/09/2015    History of Present Illness Pt s/p L THR    PT Comments    Pt progressing well.  Reviewed stairs and car transfers with pt and spouse.  Follow Up Recommendations  Home health PT     Equipment Recommendations  None recommended by PT    Recommendations for Other Services OT consult     Precautions / Restrictions Precautions Precautions: Fall Restrictions Weight Bearing Restrictions: No Other Position/Activity Restrictions: WBAT    Mobility  Bed Mobility               General bed mobility comments: Pt declines back to bed  Transfers Overall transfer level: Needs assistance Equipment used: None Transfers: Sit to/from Stand Sit to Stand: Supervision         General transfer comment: min cues for LE management  Ambulation/Gait Ambulation/Gait assistance: Min guard;Supervision Ambulation Distance (Feet): 70 Feet Assistive device: Rolling walker (2 wheeled) Gait Pattern/deviations: Step-to pattern;Step-through pattern;Decreased step length - right;Decreased step length - left;Shuffle;Trunk flexed Gait velocity: decreased 2* increased pain this am   General Gait Details: cues for posture, position from RW and initial sequence   Stairs Stairs: Yes Stairs assistance: Min assist Stair Management: One rail Right;Step to pattern;No rails;Backwards;Forwards;With walker;With cane Number of Stairs: 4 General stair comments: 2 steps with rail and cane.  single step with RW twice - bkwd and fwd.  Cues for sequence and foot/cane/RW placement  Wheelchair Mobility    Modified Rankin (Stroke Patients Only)       Balance                                    Cognition Arousal/Alertness: Awake/alert Behavior During Therapy: WFL for tasks assessed/performed Overall Cognitive Status: Within Functional Limits for tasks  assessed                      Exercises Total Joint Exercises Ankle Circles/Pumps: AROM;Both;15 reps;Supine Quad Sets: AROM;Both;10 reps;Supine Gluteal Sets: AROM;Both;10 reps;Supine Heel Slides: AAROM;20 reps;Supine;Left Hip ABduction/ADduction: AAROM;Left;15 reps;Supine Long Arc Quad: AROM;Both;10 reps;Supine    General Comments        Pertinent Vitals/Pain Pain Assessment: 0-10 Pain Score: 4  Pain Location: L hip Pain Descriptors / Indicators: Aching;Sore Pain Intervention(s): Limited activity within patient's tolerance;Monitored during session;Premedicated before session;Ice applied    Home Living                      Prior Function            PT Goals (current goals can now be found in the care plan section) Acute Rehab PT Goals Patient Stated Goal: Walk without pain and do the things I want to PT Goal Formulation: With patient Time For Goal Achievement: 09/11/15 Potential to Achieve Goals: Good Progress towards PT goals: Progressing toward goals    Frequency  7X/week    PT Plan Current plan remains appropriate    Co-evaluation             End of Session Equipment Utilized During Treatment: Gait belt Activity Tolerance: Patient tolerated treatment well;Patient limited by pain Patient left: Other (comment);with family/visitor present (in bathroom with spouse)     Time: 1478-29561013-1033 PT Time Calculation (min) (ACUTE ONLY): 20 min  Charges:  $Gait Training: 8-22 mins $Therapeutic Exercise: 8-22  mins                    G Codes:      Jamill Wetmore Sep 10, 2015, 12:03 PM

## 2015-09-09 NOTE — Progress Notes (Signed)
Discharged from floor via w/c, belongings & spouse with pt. No changes in assessment. Aneya Daddona  

## 2015-09-09 NOTE — Progress Notes (Signed)
Utilization review completed.  

## 2015-09-09 NOTE — Progress Notes (Signed)
Patient ID: Bianca Ware, female   DOB: 05/21/1946, 69 y.o.   MRN: [id number] Looks great.  Can discharge to home today.

## 2015-09-09 NOTE — Plan of Care (Signed)
Problem: Phase III Progression Outcomes Goal: Anticoagulant follow-up in place Outcome: Not Applicable Date Met:  09/09/15 asa  Problem: Discharge Progression Outcomes Goal: Anticoagulant follow-up in place Outcome: Not Applicable Date Met:  09/09/15 asa     

## 2015-09-09 NOTE — Discharge Instructions (Signed)

## 2015-09-09 NOTE — Discharge Summary (Signed)
Patient ID: Bianca Ware MRN: [id number] DOB/AGE: 68/06/1946 68 y.o.  Admit date: 09/07/2015 Discharge date: 09/09/2015  Admission Diagnoses:  Principal Problem:   Osteoarthritis of left hip Active Problems:   Status post total replacement of left hip   Discharge Diagnoses:  Same  Past Medical History  Diagnosis Date  . Complication of anesthesia     waking up  . Arthritis   . PONV (postoperative nausea and vomiting)     Surgeries: Procedure(s): LEFT TOTAL HIP ARTHROPLASTY ANTERIOR APPROACH on 09/07/2015   Consultants:    Discharged Condition: Improved  Hospital Course: Bianca Ware is an 69 y.o. female who was admitted 09/07/2015 for operative treatment ofOsteoarthritis of left hip. Patient has severe unremitting pain that affects sleep, daily activities, and work/hobbies. After pre-op clearance the patient was taken to the operating room on 09/07/2015 and underwent  Procedure(s): LEFT TOTAL HIP ARTHROPLASTY ANTERIOR APPROACH.    Patient was given perioperative antibiotics: Anti-infectives    Start     Dose/Rate Route Frequency Ordered Stop   09/07/15 1700  ceFAZolin (ANCEF) IVPB 1 g/50 mL premix     1 g 100 mL/hr over 30 Minutes Intravenous Every 6 hours 09/07/15 1352 09/08/15 0036   09/07/15 0833  ceFAZolin (ANCEF) IVPB 2 g/50 mL premix     2 g 100 mL/hr over 30 Minutes Intravenous On call to O.R. 09/07/15 0833 09/07/15 1037       Patient was given sequential compression devices, early ambulation, and chemoprophylaxis to prevent DVT.  Patient benefited maximally from hospital stay and there were no complications.    Recent vital signs: Patient Vitals for the past 24 hrs:  BP Temp Temp src Pulse Resp SpO2  09/09/15 0500 (!) 143/60 mmHg 99 F (37.2 C) Oral 80 18 95 %  09/08/15 2100 120/73 mmHg 99.6 F (37.6 C) Oral 86 18 99 %  09/08/15 1400 122/68 mmHg 98.2 F (36.8 C) Oral 78 16 99 %  09/08/15 1101 128/60 mmHg 98.2 F (36.8 C) Oral (!) 16 16 98 %      Recent laboratory studies:  Recent Labs  09/08/15 0445  WBC 11.0*  HGB 11.3*  HCT 34.5*  PLT 221  NA 135  K 3.9  CL 101  CO2 26  BUN 12  CREATININE 0.59  GLUCOSE 133*  CALCIUM 8.9     Discharge Medications:     Medication List    STOP taking these medications        GLUCOSAMINE-CHONDROITIN PO      TAKE these medications        acetaminophen 500 MG tablet  Commonly known as:  TYLENOL  Take 500 mg by mouth 4 (four) times daily.     aspirin 325 MG EC tablet  Take 1 tablet (325 mg total) by mouth 2 (two) times daily after a meal.     atorvastatin 40 MG tablet  Commonly known as:  LIPITOR  Take 40 mg by mouth daily with supper.     Biotin 5000 MCG Caps  Take 5,000 mcg by mouth 2 (two) times daily.     CALCIUM + D PO  Take 2 tablets by mouth every morning. Calcium 1200 mg, vitamin d 1000 units     cetirizine 10 MG tablet  Commonly known as:  ZYRTEC  Take 10 mg by mouth at bedtime.     cyclobenzaprine 5 MG tablet  Commonly known as:  FLEXERIL  Take 5 mg by mouth 3 (three) times daily as  needed for muscle spasms.     HYDROcodone-acetaminophen 7.5-325 MG tablet  Commonly known as:  NORCO  Take 1-2 tablets by mouth every 6 (six) hours as needed for moderate pain.     HYDROcodone-acetaminophen 5-325 MG tablet  Commonly known as:  NORCO  Take 1-2 tablets by mouth every 4 (four) hours as needed for moderate pain.     Melatonin 3 MG Tabs  Take 6 mg by mouth at bedtime.     Omega-3 1000 MG Caps  Take 1,000 mg by mouth 3 (three) times daily.     tiZANidine 2 MG tablet  Commonly known as:  ZANAFLEX  Take 1 tablet (2 mg total) by mouth every 6 (six) hours as needed for muscle spasms.     vitamin C 500 MG tablet  Commonly known as:  ASCORBIC ACID  Take 500 mg by mouth every morning.        Diagnostic Studies: Dg C-arm 1-60 Min-no Report  09/07/2015  CLINICAL DATA: SURGERY C-ARM 1-60 MINUTES Fluoroscopy was utilized by the requesting physician.  No  radiographic interpretation.   Dg Hip Unilat With Pelvis 1v Left  09/07/2015  CLINICAL DATA:  Left total hip replacement. EXAM: DG HIP (WITH OR WITHOUT PELVIS) 1V*L* ; DG C-ARM 1-60 MIN-NO REPORT COMPARISON:  None. FLUOROSCOPY TIME:  25 seconds. FINDINGS: Two intraoperative fluoroscopic images were obtained of the left hip. The femoral and acetabular components appear to be well situated. No fracture or dislocation is noted. IMPRESSION: Status post left total hip arthroplasty. Electronically Signed   By: Lupita Raider, M.D.   On: 09/07/2015 12:00   Dg Hip Port Unilat With Pelvis 1v Left  09/07/2015  CLINICAL DATA:  Postop left hip replacement EXAM: DG HIP (WITH OR WITHOUT PELVIS) 1V PORT LEFT COMPARISON:  09/07/2015 FINDINGS: Patient is status post left total hip arthroplasty. Expected postoperative appearance. Components appear aligned. No hardware or osseous abnormality. Bones are osteopenic. Bony pelvis intact. IMPRESSION: Status post left total hip arthroplasty. Expected postoperative appearance. Electronically Signed   By: Judie Petit.  Shick M.D.   On: 09/07/2015 12:44    Disposition: 01-Home or Self Care      Discharge Instructions    Discharge patient    Complete by:  As directed            Follow-up Information    Follow up with [facility].   Why:  Home Health Physical Therapy   Contact information:   [address] [place] [address] [phone]       Follow up with Kathryne Hitch, MD In 2 weeks.   Specialty:  Orthopedic Surgery   Contact information:   [address] [place] [address] [phone]        Signed: Kathryne Hitch 09/09/2015, 8:42 AM   !

## 2015-09-09 NOTE — Progress Notes (Signed)
Physical Therapy Treatment Patient Details Name: Bianca Ware MRN: [id number] DOB: 02/03/1946 Today's Date: 09/09/2015    History of Present Illness Pt s/p L THR    PT Comments    Pt continues motivated and progressing with mobility despite increased pain this am.    Follow Up Recommendations  Home health PT     Equipment Recommendations  None recommended by PT    Recommendations for Other Services OT consult     Precautions / Restrictions Precautions Precautions: Fall Restrictions Weight Bearing Restrictions: No Other Position/Activity Restrictions: WBAT    Mobility  Bed Mobility               General bed mobility comments: Pt declines back to bed  Transfers Overall transfer level: Needs assistance Equipment used: None Transfers: Sit to/from Stand Sit to Stand: Supervision         General transfer comment: min cues for LE management  Ambulation/Gait Ambulation/Gait assistance: Min guard;Supervision Ambulation Distance (Feet): 123 Feet Assistive device: Rolling walker (2 wheeled) Gait Pattern/deviations: Step-to pattern;Step-through pattern;Decreased step length - right;Decreased step length - left;Shuffle;Trunk flexed Gait velocity: decreased 2* increased pain this am   General Gait Details: cues for posture, position from RW and initial sequence   Stairs            Wheelchair Mobility    Modified Rankin (Stroke Patients Only)       Balance                                    Cognition Arousal/Alertness: Awake/alert Behavior During Therapy: WFL for tasks assessed/performed Overall Cognitive Status: Within Functional Limits for tasks assessed                      Exercises Total Joint Exercises Ankle Circles/Pumps: AROM;Both;15 reps;Supine Quad Sets: AROM;Both;10 reps;Supine Gluteal Sets: AROM;Both;10 reps;Supine Heel Slides: AAROM;20 reps;Supine;Left Hip ABduction/ADduction: AAROM;Left;15  reps;Supine Long Arc Quad: AROM;Both;10 reps;Supine    General Comments        Pertinent Vitals/Pain Pain Assessment: 0-10 Pain Score: 4  Pain Location: L hip Pain Descriptors / Indicators: Aching;Spasm;Sore Pain Intervention(s): Limited activity within patient's tolerance;Monitored during session;Premedicated before session;Ice applied (Requested muscle relax)    Home Living                      Prior Function            PT Goals (current goals can now be found in the care plan section) Acute Rehab PT Goals Patient Stated Goal: Walk without pain and do the things I want to PT Goal Formulation: With patient Time For Goal Achievement: 09/11/15 Potential to Achieve Goals: Good Progress towards PT goals: Progressing toward goals    Frequency  7X/week    PT Plan Current plan remains appropriate    Co-evaluation             End of Session Equipment Utilized During Treatment: Gait belt Activity Tolerance: Patient tolerated treatment well;Patient limited by pain Patient left: in chair;with call bell/phone within reach     Time: 0812-0843 PT Time Calculation (min) (ACUTE ONLY): 31 min  Charges:  $Gait Training: 8-22 mins $Therapeutic Exercise: 8-22 mins                    G Codes:      Bianca Ware 09/09/2015, 11:59 AM

## 2015-09-10 ENCOUNTER — Encounter (HOSPITAL_COMMUNITY): Payer: Self-pay | Admitting: Orthopaedic Surgery

## 2016-02-04 ENCOUNTER — Other Ambulatory Visit: Payer: Self-pay

## 2016-02-04 DIAGNOSIS — Z1231 Encounter for screening mammogram for malignant neoplasm of breast: Secondary | ICD-10-CM

## 2016-02-12 ENCOUNTER — Ambulatory Visit
Admission: RE | Admit: 2016-02-12 | Discharge: 2016-02-12 | Disposition: A | Discharge status: Home / Self Care | Payer: Medicare Other | Source: Ambulatory Visit

## 2016-02-12 DIAGNOSIS — Z1231 Encounter for screening mammogram for malignant neoplasm of breast: Secondary | ICD-10-CM

## 2016-06-24 ENCOUNTER — Ambulatory Visit: Payer: Medicare Other | Attending: Orthopaedic Surgery | Admitting: Physical Therapy

## 2016-06-24 ENCOUNTER — Encounter: Payer: Self-pay | Admitting: Physical Therapy

## 2016-06-24 DIAGNOSIS — R262 Difficulty in walking, not elsewhere classified: Secondary | ICD-10-CM | POA: Insufficient documentation

## 2016-06-24 DIAGNOSIS — M79671 Pain in right foot: Secondary | ICD-10-CM | POA: Diagnosis present

## 2016-06-24 DIAGNOSIS — M25571 Pain in right ankle and joints of right foot: Secondary | ICD-10-CM | POA: Insufficient documentation

## 2016-06-24 NOTE — Therapy (Signed)
[facility] [place] [address] [place], [place], [address] Phone: [phone]   Fax:  [phone]  Physical Therapy Evaluation  Patient Details  Name: Bianca Ware MRN: [id number] Date of Birth: 07-04-1946 Referring Provider: Magnus Ivan  Encounter Date: 06/24/2016      PT End of Session - 06/24/16 1609    Visit Number 1   Date for PT Re-Evaluation 08/24/16   PT Start Time 1529   PT Stop Time 1613   PT Time Calculation (min) 44 min   Activity Tolerance Patient tolerated treatment well   Behavior During Therapy [facility] for tasks assessed/performed      Past Medical History:  Diagnosis Date  . Arthritis   . Complication of anesthesia    waking up  . PONV (postoperative nausea and vomiting)     Past Surgical History:  Procedure Laterality Date  . ABDOMINAL HYSTERECTOMY    . DILATION AND CURETTAGE OF UTERUS    . LUMBAR LAMINECTOMY N/A 05/29/2015   Procedure: LEFT L4-5 MICRODISCECTOMY;  Surgeon: Kerrin Champagne, MD;  [facility]: [facility] OR;  Service: Orthopedics;  Laterality: N/A;  . TONSILLECTOMY    . TOTAL HIP ARTHROPLASTY Left 09/07/2015   Procedure: LEFT TOTAL HIP ARTHROPLASTY ANTERIOR APPROACH;  Surgeon: Kathryne Hitch, MD;  Location: [facility] ORS;  Service: Orthopedics;  Laterality: Left;  . TUBAL LIGATION      There were no vitals filed for this visit.       Subjective Assessment - 06/24/16 1527    Subjective Patient reports a lumba rsurgery in July 2016, then a left THr in October 2016, she is now having right achilles pain as well as plantar fascitis.  She reports that maybe the pain in the ankle started after the hip replacement from compensations.  She has been having pain i the left achilles and foot since November.  She was placed in a boot about 2 weeks ago.     Limitations Standing;Walking;House hold activities   Patient Stated Goals have less pain and walk normally   Currently in Pain? Yes   Pain Score 2    Pain Location Heel   Pain Orientation Right   Pain Descriptors / Indicators Aching;Sharp   Pain Type Chronic pain   Pain Onset More than a month ago   Pain Frequency Intermittent   Aggravating Factors  walking, standing, first thing in the AM pain can be up to 8/10   Pain Relieving Factors rest, wearing boot pain can be a 1-2/10   Effect of Pain on Daily Activities limits ability to walk, stand and drive            [facility] PT Assessment - 06/24/16 0001      Assessment   Medical Diagnosis right achilles tendonitis and plantar fascitis   Referring Provider Magnus Ivan   Onset Date/Surgical Date 05/24/16   Prior Therapy after the left THR in October 2016     Precautions   Precautions None     Balance Screen   Has the patient fallen in the past 6 months No   Has the patient had a decrease in activity level because of a fear of falling?  No   Is the patient reluctant to leave their home because of a fear of falling?  No     Home Environment   Additional Comments couple of steps into the home, normally does her own housework     Prior Function   Level of Independence  Independent   Vocation Retired   Automotive engineer   Leisure prior to this she was walking for exercise     ROM / Strength   AROM / PROM / Strength PROM;AROM;Strength     AROM   AROM Assessment Site Ankle   Right/Left Ankle Right   Right Ankle Dorsiflexion 0   Right Ankle Plantar Flexion 35   Right Ankle Inversion 15   Right Ankle Eversion 6     PROM   PROM Assessment Site Ankle   Right/Left Ankle Right   Right Ankle Dorsiflexion 10   Right Ankle Plantar Flexion 40   Right Ankle Inversion 20   Right Ankle Eversion 10     Strength   Overall Strength Comments right ankle strength 4-/5 for PF/DF and 3+/5 for EV/Inversion     Ambulation/Gait   Gait Comments gait is with no device, with boot on the right, mild antalgic on the right                   [facility] Adult PT  Treatment/Exercise - 06/24/16 0001      Modalities   Modalities Iontophoresis;Ultrasound     Ultrasound   Ultrasound Location right lateral achilles   Ultrasound Parameters 1.3w/cm2   Ultrasound Goals Pain     Iontophoresis   Type of Iontophoresis Dexamethasone   Location right lateral achilles   Dose 79mA dose   Time 4 hour patch                PT Education - 06/24/16 1609    Education provided Yes   Education Details gave HEP for gastroc and soleus stretches   Person(s) Educated Patient   Methods Explanation;Demonstration;Handout   Comprehension Verbalized understanding          PT Short Term Goals - 06/24/16 1659      PT SHORT TERM GOAL #1   Title independent with intial HEP   Time 1   Period Weeks   Status New           PT Long Term Goals - 06/24/16 1659      PT LONG TERM GOAL #1   Title decrease pain 50%   Time 6   Period Weeks   Status New     PT LONG TERM GOAL #2   Title increase DF to 10 degrees   Time 6   Period Weeks   Status New     PT LONG TERM GOAL #3   Title walk without difficulty   Time 6   Period Weeks   Status New     PT LONG TERM GOAL #4   Title increase inversion and eversion strength to 4/5   Time 6   Period Weeks   Status New     PT LONG TERM GOAL #5   Title understand RICE   Time 6   Period Weeks   Status New               Plan - 06/24/16 1610    Clinical Impression Statement Patient with right achilles tendonitis and plantar fascitis, she had back surgery and hip surgery last year and feels that she was walking differently and that may have caused the ankle/foot issues.  She is in a boot, she has very tight calves.   Rehab Potential Good   PT Frequency 2x / week   PT Duration 6 weeks   PT Treatment/Interventions Cryotherapy;ADLs/Self Care Home Management;Electrical Stimulation;Iontophoresis 4mg /ml Dexamethasone;Moist Heat;Ultrasound;Gait  training;Stair training;Functional mobility  training;Therapeutic activities;Therapeutic exercise;Balance training;Patient/family education;Manual techniques   PT Next Visit Plan slowly add some exercises, continue with ionto and [place]   Consulted and Agree with Plan of Care Patient      Patient will benefit from skilled therapeutic intervention in order to improve the following deficits and impairments:  Abnormal gait, Decreased balance, Decreased mobility, Decreased range of motion, Decreased strength, Difficulty walking, Impaired flexibility, Pain  Visit Diagnosis: Pain in right ankle and joints of right foot - Plan: PT plan of care cert/re-cert  Pain in right foot - Plan: PT plan of care cert/re-cert  Difficulty in walking, not elsewhere classified - Plan: PT plan of care cert/re-cert      G-Codes - 2016-07-02 1704    Functional Assessment Tool Used foto 62% limitation   Functional Limitation Mobility: Walking and moving around   Mobility: Walking and Moving Around Current Status (A5409) At least 60 percent but less than 80 percent impaired, limited or restricted       Problem List Patient Active Problem List   Diagnosis Date Noted  . Osteoarthritis of left hip 09/07/2015  . Status post total replacement of left hip 09/07/2015  . Lumbar disc herniation with radiculopathy 05/29/2015    Class: Acute  . Spondylolisthesis of lumbar region 05/29/2015    Class: Chronic    Jearld Lesch., PT July 02, 2016, 5:07 PM  [facility] [place] [address] [place], [place], [address] Phone: [phone]   Fax:  [phone]  Name: Bianca Ware MRN: [id number] Date of Birth: 04/25/1946

## 2016-07-01 ENCOUNTER — Ambulatory Visit: Payer: Medicare Other | Admitting: Physical Therapy

## 2016-07-01 DIAGNOSIS — M25571 Pain in right ankle and joints of right foot: Secondary | ICD-10-CM

## 2016-07-01 DIAGNOSIS — M79671 Pain in right foot: Secondary | ICD-10-CM

## 2016-07-01 DIAGNOSIS — R262 Difficulty in walking, not elsewhere classified: Secondary | ICD-10-CM

## 2016-07-01 NOTE — Therapy (Signed)
[facility] [place] [address] [place], [place], [address] Phone: [phone]   Fax:  [phone]  Physical Therapy Treatment  Patient Details  Name: Bianca Ware MRN: [id number] Date of Birth: 1946-02-27 Referring Provider: Magnus Ivan  Encounter Date: 07/01/2016      PT End of Session - 07/01/16 1438    Visit Number 2   Date for PT Re-Evaluation 08/24/16   PT Start Time 1315   PT Stop Time 1400   PT Time Calculation (min) 45 min   Activity Tolerance Patient tolerated treatment well   Behavior During Therapy [facility] for tasks assessed/performed      Past Medical History:  Diagnosis Date  . Arthritis   . Complication of anesthesia    waking up  . PONV (postoperative nausea and vomiting)     Past Surgical History:  Procedure Laterality Date  . ABDOMINAL HYSTERECTOMY    . DILATION AND CURETTAGE OF UTERUS    . LUMBAR LAMINECTOMY N/A 05/29/2015   Procedure: LEFT L4-5 MICRODISCECTOMY;  Surgeon: Kerrin Champagne, MD;  Location: [facility] OR;  Service: Orthopedics;  Laterality: N/A;  . TONSILLECTOMY    . TOTAL HIP ARTHROPLASTY Left 09/07/2015   Procedure: LEFT TOTAL HIP ARTHROPLASTY ANTERIOR APPROACH;  Surgeon: Kathryne Hitch, MD;  [facility]: [facility];  Service: Orthopedics;  Laterality: Left;  . TUBAL LIGATION      There were no vitals filed for this visit.      Subjective Assessment - 07/01/16 1315    Subjective Pt reports that pain in foot has not been good but is better than what it was.    Limitations Standing;Walking;House hold activities   Patient Stated Goals have less pain and walk normally   Currently in Pain? Yes   Pain Score 2    Pain Location Heel   Pain Orientation Right;Medial;Lateral;Posterior                         [facility] Adult PT Treatment/Exercise - 07/01/16 0001      Ultrasound   Ultrasound Location right lateral achilles   Ultrasound Parameters 1.3w/cm2   Ultrasound Goals  Pain     Manual Therapy   Soft tissue mobilization To plantar aspect of R foot up into achilles tendon   Passive ROM PF/DF/Inversion/Eversion holding at end ranges x10 seconds     Ankle Exercises: Seated   Ankle Circles/Pumps 10 reps;Right   Ankle Circles/Pumps Limitations On blue disc, 10 ea. direction                  PT Short Term Goals - 06/24/16 1659      PT SHORT TERM GOAL #1   Title independent with intial HEP   Time 1   Period Weeks   Status New           PT Long Term Goals - 06/24/16 1659      PT LONG TERM GOAL #1   Title decrease pain 50%   Time 6   Period Weeks   Status New     PT LONG TERM GOAL #2   Title increase DF to 10 degrees   Time 6   Period Weeks   Status New     PT LONG TERM GOAL #3   Title walk without difficulty   Time 6   Period Weeks   Status New     PT LONG TERM GOAL #4   Title  increase inversion and eversion strength to 4/5   Time 6   Period Weeks   Status New     PT LONG TERM GOAL #5   Title understand RICE   Time 6   Period Weeks   Status New               Plan - 07/01/16 1439    Clinical Impression Statement Pt tolerated exercises on disc with slight increase in pain noted. Pt has tenderness in the R lateral aspect of achilles tendon into the lateral malleolus. Pt had decreased stance time on RLE depicting an antalgic gait pattern. NE from ionto so held.   Rehab Potential Good   PT Frequency 2x / week   PT Duration 6 weeks   PT Treatment/Interventions Cryotherapy;ADLs/Self Care Home Management;Electrical Stimulation;Iontophoresis 4mg /ml Dexamethasone;Moist Heat;Ultrasound;Gait training;Stair training;Functional mobility training;Therapeutic activities;Therapeutic exercise;Balance training;Patient/family education;Manual techniques   PT Next Visit Plan Implement stationary bike next visit and try to add exercises as tolerated. Continue with manual therapy and US.       Patient will benefit from skilled  therapeutic intervention in order to improve the following deficits and impairments:  Abnormal gait, Decreased balance, Decreased mobility, Decreased range of motion, Decreased strength, Difficulty walking, Impaired flexibility, Pain  Visit Diagnosis: Pain in right ankle and joints of right foot  Pain in right foot  Difficulty in walking, not elsewhere classified     Problem List Patient Active Problem List   Diagnosis Date Noted  . Osteoarthritis of left hip 09/07/2015  . Status post total replacement of left hip 09/07/2015  . Lumbar disc herniation with radiculopathy 05/29/2015    Class: Acute  . Spondylolisthesis of lumbar region 05/29/2015    Class: Chronic    Justus MemoryBrianna Dailon Sheeran, SPTA 07/01/2016, 2:43 PM  [facility] [place] [place] [address] [place], [place], [address] Phone: [phone]   Fax:  [phone]  Name: Bianca Ware MRN: [id number] Date of Birth: 05/22/1946

## 2016-07-03 ENCOUNTER — Ambulatory Visit: Payer: Medicare Other | Admitting: Physical Therapy

## 2016-07-03 DIAGNOSIS — M25571 Pain in right ankle and joints of right foot: Secondary | ICD-10-CM

## 2016-07-03 DIAGNOSIS — R262 Difficulty in walking, not elsewhere classified: Secondary | ICD-10-CM

## 2016-07-03 DIAGNOSIS — M79671 Pain in right foot: Secondary | ICD-10-CM

## 2016-07-03 NOTE — Therapy (Signed)
[facility] [place] [address] [place], [place], [address] Phone: [phone]   Fax:  [phone]  Physical Therapy Treatment  Patient Details  Name: Bianca Ware MRN: [id number] Date of Birth: 12/24/1945 Referring Provider: Magnus IvanBlackman  Encounter Date: 07/03/2016      PT End of Session - 07/03/16 1534    Visit Number 3   Date for PT Re-Evaluation 08/24/16   PT Start Time 1445   PT Stop Time 1530   PT Time Calculation (min) 45 min   Activity Tolerance Patient tolerated treatment well;Patient limited by pain   Behavior [facility] Therapy [facility] for tasks assessed/performed      Past Medical History:  Diagnosis Date  . Arthritis   . Complication of anesthesia    waking up  . PONV (postoperative nausea and vomiting)     Past Surgical History:  Procedure Laterality Date  . ABDOMINAL HYSTERECTOMY    . DILATION AND CURETTAGE OF UTERUS    . LUMBAR LAMINECTOMY N/A 05/29/2015   Procedure: LEFT L4-5 MICRODISCECTOMY;  Surgeon: Kerrin ChampagneJames E Nitka, MD;  Location: [facility] OR;  Service: Orthopedics;  Laterality: N/A;  . TONSILLECTOMY    . TOTAL HIP ARTHROPLASTY Left 09/07/2015   Procedure: LEFT TOTAL HIP ARTHROPLASTY ANTERIOR APPROACH;  Surgeon: Kathryne Hitchhristopher Y Blackman, MD;  [facility];  Service: Orthopedics;  Laterality: Left;  . TUBAL LIGATION      There were no vitals filed for this visit.      Subjective Assessment - 07/03/16 1449    Subjective Pt reports that this morning pain was pretty bad.    Limitations Standing;Walking;House hold activities   Patient Stated Goals have less pain and walk normally   Currently in Pain? Yes   Pain Score 4    Pain Location Ankle                         [facility] Adult PT Treatment/Exercise - 07/03/16 0001      Ultrasound   Ultrasound Location right lateral achilles   Ultrasound Parameters 3Mhz 1.3/cm2   Ultrasound Goals Pain     Manual Therapy   Soft tissue mobilization To  plantar aspect of R foot up into achilles tendon   Passive ROM PF/DF/Inversion/Eversion holding at end ranges x10 seconds   [place] Applied tape for achilles tendon and plantar fascitis     Ankle Exercises: Seated   Ankle Circles/Pumps Right;20 reps   Ankle Circles/Pumps Limitations On blue disc 20 in each direction   Towel Crunch 5 reps   Other Seated Ankle Exercises Sliding foot on towel toe in toe out x15 total   Other Seated Ankle Exercises 4-way ankle with yellow tband 15ea direction                  PT Short Term Goals - 06/24/16 1659      PT SHORT TERM GOAL #1   Title independent with intial HEP   Time 1   Period Weeks   Status New           PT Long Term Goals - 06/24/16 1659      PT LONG TERM GOAL #1   Title decrease pain 50%   Time 6   Period Weeks   Status New     PT LONG TERM GOAL #2   Title increase DF to 10 degrees   Time 6   Period Weeks   Status New  PT LONG TERM GOAL #3   Title walk without difficulty   Time 6   Period Weeks   Status New     PT LONG TERM GOAL #4   Title increase inversion and eversion strength to 4/5   Time 6   Period Weeks   Status New     PT LONG TERM GOAL #5   Title understand RICE   Time 6   Period Weeks   Status New               Plan - 07/03/16 1535    Clinical Impression Statement Pt tolerated added exercises today, but could not tolerate stationary bike due to subjective report of previous THR, and related pain in groin area. Pt had decreased circular ankle motions during disc exercise. Pt still has some tenderness in lateral aspect of achilles tendon into lateral malleolus.    Rehab Potential Good   PT Frequency 2x / week   PT Duration 6 weeks   PT Treatment/Interventions Cryotherapy;ADLs/Self Care Home Management;Electrical Stimulation;Iontophoresis /ml Dexamethasone;Moist Heat;Ultrasound;Gait training;Stair training;Functional mobility training;Therapeutic activities;Therapeutic  exercise;Balance training;Patient/family education;Manual techniques   PT Next Visit Plan Continue with seated ankle ther ex and try to progress tband as tolerated.       Patient will benefit from skilled therapeutic intervention in order to improve the following deficits and impairments:  Abnormal gait, Decreased balance, Decreased mobility, Decreased range of motion, Decreased strength, Difficulty walking, Impaired flexibility, Pain  Visit Diagnosis: Pain in right ankle and joints of right foot  Pain in right foot  Difficulty in walking, not elsewhere classified     Problem List Patient Active Problem List   Diagnosis Date Noted  . Osteoarthritis of left hip 09/07/2015  . Status post total replacement of left hip 09/07/2015  . Lumbar disc herniation with radiculopathy 05/29/2015    Class: Acute  . Spondylolisthesis of lumbar region 05/29/2015    Class: Chronic    Justus Memory, SPTA 07/03/2016, 4:09 PM  [facility] [place] [address] [place], [place], [address] Phone: [phone]   Fax:  [phone]  Name: Bianca Ware MRN: [id number] Date of Birth: Jul 16, 1946

## 2016-07-08 ENCOUNTER — Ambulatory Visit: Payer: Medicare Other | Admitting: Physical Therapy

## 2016-07-08 DIAGNOSIS — M25571 Pain in right ankle and joints of right foot: Secondary | ICD-10-CM | POA: Diagnosis not present

## 2016-07-08 DIAGNOSIS — M79671 Pain in right foot: Secondary | ICD-10-CM

## 2016-07-08 DIAGNOSIS — R262 Difficulty in walking, not elsewhere classified: Secondary | ICD-10-CM

## 2016-07-08 NOTE — Therapy (Signed)
[facility] [place] [address] [place], [place], [address] Phone: [phone]   Fax:  [phone]  Physical Therapy Treatment  Patient Details  Name: Bianca Ware MRN: [id number] Date of Birth: 10/17/1946 Referring Provider: Magnus IvanBlackman  Encounter Date: 07/08/2016      PT End of Session - 07/08/16 1109    Visit Number 4   Date for PT Re-Evaluation 08/24/16   PT Start Time 0930   PT Stop Time 1025   PT Time Calculation (min) 55 min   Activity Tolerance Patient tolerated treatment well   Behavior During Therapy [facility] for tasks assessed/performed      Past Medical History:  Diagnosis Date  . Arthritis   . Complication of anesthesia    waking up  . PONV (postoperative nausea and vomiting)     Past Surgical History:  Procedure Laterality Date  . ABDOMINAL HYSTERECTOMY    . DILATION AND CURETTAGE OF UTERUS    . LUMBAR LAMINECTOMY N/A 05/29/2015   Procedure: LEFT L4-5 MICRODISCECTOMY;  Surgeon: Kerrin ChampagneJames E Nitka, MD;  Location: [facility] OR;  Service: Orthopedics;  Laterality: N/A;  . TONSILLECTOMY    . TOTAL HIP ARTHROPLASTY Left 09/07/2015   Procedure: LEFT TOTAL HIP ARTHROPLASTY ANTERIOR APPROACH;  Surgeon: Kathryne Hitchhristopher Y Blackman, MD;  [facility];  Service: Orthopedics;  Laterality: Left;  . TUBAL LIGATION      There were no vitals filed for this visit.      Subjective Assessment - 07/08/16 0929    Subjective Pt rpeorts that this weekend was really rough with pain. After taking KT tape off, both ankles were really swollen and painful.    Limitations Standing;Walking;House hold activities   Patient Stated Goals have less pain and walk normally   Currently in Pain? Yes   Pain Score 3    Pain Location Ankle   Pain Orientation Right;Medial;Lateral;Posterior                         [facility] Adult PT Treatment/Exercise - 07/08/16 0001      Moist Heat Therapy   Number Minutes Moist Heat 15 Minutes   Moist Heat Location Ankle     Electrical Stimulation   Electrical Stimulation Location Plantar aspect of R foot and into lower R calf/achilles tendon   Electrical Stimulation Parameters Premod   Electrical Stimulation Goals Pain     Manual Therapy   Soft tissue mobilization To plantar aspect of R foot up into achilles tendon   Passive ROM PF/DF/Inversion/Eversion holding at end ranges x10 seconds     Ankle Exercises: Seated   Ankle Circles/Pumps Right;20 reps   Ankle Circles/Pumps Limitations On blue disc 20 in each direction   Other Seated Ankle Exercises Sliding foot on towel toe in toe out x15 total   Other Seated Ankle Exercises 4-way ankle with red tband 15 ea direction     Ankle Exercises: Stretches   Soleus Stretch 3 reps;10 seconds   Gastroc Stretch 3 reps;10 seconds     Ankle Exercises: Standing   Other Standing Ankle Exercises Lunges with RLE only to BOSU x10                  PT Short Term Goals - 07/08/16 1113      PT SHORT TERM GOAL #1   Title independent with intial HEP   Time 1   Period Weeks   Status Achieved  PT Long Term Goals - 07/08/16 1114      PT LONG TERM GOAL #1   Title decrease pain 50%   Time 6   Period Weeks   Status On-going     PT LONG TERM GOAL #2   Title increase DF to 10 degrees   Time 6   Period Weeks   Status On-going     PT LONG TERM GOAL #3   Title walk without difficulty   Time 6   Period Weeks   Status On-going     PT LONG TERM GOAL #4   Title increase inversion and eversion strength to 4/5   Time 6   Period Weeks   Status On-going     PT LONG TERM GOAL #5   Title understand RICE   Time 6   Period Weeks   Status On-going               Plan - 07/08/16 1110    Clinical Impression Statement Pt tolerated added stretches, progression to red tband and lunges to BOSU today. Pt required verbal cues to relax during STM and cues for proprer form during gastroc and soleous stretch.    Rehab  Potential Good   PT Frequency 2x / week   PT Duration 6 weeks   PT Treatment/Interventions Cryotherapy;ADLs/Self Care Home Management;Electrical Stimulation;Iontophoresis 4mg /ml Dexamethasone;Moist Heat;Ultrasound;Gait training;Stair training;Functional mobility training;Therapeutic activities;Therapeutic exercise;Balance training;Patient/family education;Manual techniques   PT Next Visit Plan Continue with seated ankle ther ex, implement more stretches and try for minitramp bouncing to foster improved mobility of R ankle.  MD NOTE SENT-ASSESS VISIT WITH MD- PT UNSURE OF PT BENEFITS!      Patient will benefit from skilled therapeutic intervention in order to improve the following deficits and impairments:  Abnormal gait, Decreased balance, Decreased mobility, Decreased range of motion, Decreased strength, Difficulty walking, Impaired flexibility, Pain  Visit Diagnosis: Pain in right ankle and joints of right foot  Pain in right foot  Difficulty in walking, not elsewhere classified     Problem List Patient Active Problem List   Diagnosis Date Noted  . Osteoarthritis of left hip 09/07/2015  . Status post total replacement of left hip 09/07/2015  . Lumbar disc herniation with radiculopathy 05/29/2015    Class: Acute  . Spondylolisthesis of lumbar region 05/29/2015    Class: Chronic    Justus MemoryBrianna Milea Klink, SPTA 07/08/2016, 11:17 AM  [facility] [place] [address] [place], [place], [address] Phone: [phone]   Fax:  [phone]  Name: Bianca Ware MRN: [id number] Date of Birth: 11/15/1946

## 2016-07-09 ENCOUNTER — Encounter: Payer: Medicare Other | Admitting: Physical Therapy

## 2016-07-11 ENCOUNTER — Encounter: Payer: Self-pay | Admitting: Physical Therapy

## 2016-07-11 ENCOUNTER — Ambulatory Visit: Payer: Medicare Other | Admitting: Physical Therapy

## 2016-07-11 DIAGNOSIS — M25571 Pain in right ankle and joints of right foot: Secondary | ICD-10-CM | POA: Diagnosis not present

## 2016-07-11 DIAGNOSIS — R262 Difficulty in walking, not elsewhere classified: Secondary | ICD-10-CM

## 2016-07-11 DIAGNOSIS — M79671 Pain in right foot: Secondary | ICD-10-CM

## 2016-07-11 NOTE — Therapy (Signed)
[facility] [address] [place], [place], [address] Phone: [phone]   Fax:  [phone]  Physical Therapy Treatment  Patient Details  Name: Bianca Ware MRN: [id number] Date of Birth: 11/25/1945 Referring Provider: Magnus IvanBlackman  Encounter Date: 07/11/2016      PT End of Session - 07/11/16 1040    Visit Number 5   Date for PT Re-Evaluation 08/24/16   PT Start Time 1012   PT Stop Time 1055   PT Time Calculation (min) 43 min      Past Medical History:  Diagnosis Date  . Arthritis   . Complication of anesthesia    waking up  . PONV (postoperative nausea and vomiting)     Past Surgical History:  Procedure Laterality Date  . ABDOMINAL HYSTERECTOMY    . DILATION AND CURETTAGE OF UTERUS    . LUMBAR LAMINECTOMY N/A 05/29/2015   Procedure: LEFT L4-5 MICRODISCECTOMY;  Surgeon: Kerrin ChampagneJames E Nitka, MD;  Location: [facility];  Service: Orthopedics;  Laterality: N/A;  . TONSILLECTOMY    . TOTAL HIP ARTHROPLASTY Left 09/07/2015   Procedure: LEFT TOTAL HIP ARTHROPLASTY ANTERIOR APPROACH;  Surgeon: Kathryne Hitchhristopher Y Blackman, MD;  [facility];  Service: Orthopedics;  Laterality: Left;  . TUBAL LIGATION      There were no vitals filed for this visit.      Subjective Assessment - 07/11/16 1034    Subjective saw MD yesterday, he said I was very tight and it would be a SLOW process. estim did nothig last session.   Currently in Pain? Yes   Pain Score 5    Pain Location Ankle   Pain Orientation Right                         [facility] Adult PT Treatment/Exercise - 07/11/16 0001      Manual Therapy   Manual Therapy Joint mobilization;Soft tissue mobilization  rolling   Manual therapy comments deep STW to calf and achilles  foam folling to calf in DF stretch   Soft tissue mobilization achilles   Passive ROM ankle     Ankle Exercises: Plyometrics   Plyometric Exercises leg press calf raises 20# 40#   Plyometric Exercises calf raises on wall in stretch 20 imes   Plyometric Exercises active gastroc stretch on black bar   Plyometric Exercises active soleous stretch on black bar   Plyometric Exercises Nustep 6 min     Ankle Exercises: Standing   Other Standing Ankle Exercises sit fit standing   Other Standing Ankle Exercises mini tramp                  PT Short Term Goals - 07/08/16 1113      PT SHORT TERM GOAL #1   Title independent with intial HEP   Time 1   Period Weeks   Status Achieved           PT Long Term Goals - 07/11/16 1040      PT LONG TERM GOAL #1   Title decrease pain 50%   Baseline per pt pain was an 8 now a 5 so some better   Status On-going     PT LONG TERM GOAL #2   Title increase DF to 10 degrees   Baseline Left DF after MT and ther ex 9   Status On-going     PT LONG TERM GOAL #3   Title walk without  difficulty   Status On-going     PT LONG TERM GOAL #4   Title increase inversion and eversion strength to 4/5   Status On-going     PT LONG TERM GOAL #5   Title understand RICE   Status Achieved               Plan - 07/11/16 1047    Clinical Impression Statement multi large knots in calf and very tender,esp lateral calf, looser after MT. No modalities since NE per pt report. Focus on ankle stretching and strengthening.   PT Next Visit Plan continue aggressive STW. Possible dry needling      Patient will benefit from skilled therapeutic intervention in order to improve the following deficits and impairments:  Abnormal gait, Decreased balance, Decreased mobility, Decreased range of motion, Decreased strength, Difficulty walking, Impaired flexibility, Pain  Visit Diagnosis: Pain in right ankle and joints of right foot  Pain in right foot  Difficulty in walking, not elsewhere classified     Problem List Patient Active Problem List   Diagnosis Date Noted  . Osteoarthritis of left hip 09/07/2015  . Status post total  replacement of left hip 09/07/2015  . Lumbar disc herniation with radiculopathy 05/29/2015    Class: Acute  . Spondylolisthesis of lumbar region 05/29/2015    Class: Chronic    Bianca Ware,Bianca Ware PTA 07/11/2016, 10:53 AM  [facility] [address] [place], [place], [address] Phone: [phone]   Fax:  [phone]  Name: Bianca Ware MRN: [id number] Date of Birth: 05/21/1946

## 2016-07-15 ENCOUNTER — Ambulatory Visit: Payer: Medicare Other | Admitting: Physical Therapy

## 2016-07-15 ENCOUNTER — Encounter: Payer: Self-pay | Admitting: Physical Therapy

## 2016-07-15 DIAGNOSIS — M25571 Pain in right ankle and joints of right foot: Secondary | ICD-10-CM | POA: Diagnosis not present

## 2016-07-15 DIAGNOSIS — R262 Difficulty in walking, not elsewhere classified: Secondary | ICD-10-CM

## 2016-07-15 DIAGNOSIS — M79671 Pain in right foot: Secondary | ICD-10-CM

## 2016-07-15 NOTE — Therapy (Signed)
[facility] [address] [place], [place], [address] Phone: [phone]   Fax:  [phone]  Physical Therapy Treatment  Patient Details  Name: Bianca Ware MRN: [id number] Date of Birth: 07/10/1946 Referring Provider: Magnus IvanBlackman  Encounter Date: 07/15/2016      PT End of Session - 07/15/16 1008    Visit Number 6   Date for PT Re-Evaluation 08/24/16   PT Start Time 0928   PT Stop Time 1015   PT Time Calculation (min) 47 min      Past Medical History:  Diagnosis Date  . Arthritis   . Complication of anesthesia    waking up  . PONV (postoperative nausea and vomiting)     Past Surgical History:  Procedure Laterality Date  . ABDOMINAL HYSTERECTOMY    . DILATION AND CURETTAGE OF UTERUS    . LUMBAR LAMINECTOMY N/A 05/29/2015   Procedure: LEFT L4-5 MICRODISCECTOMY;  Surgeon: Kerrin ChampagneJames E Nitka, MD;  [facility];  Service: Orthopedics;  Laterality: N/A;  . TONSILLECTOMY    . TOTAL HIP ARTHROPLASTY Left 09/07/2015   Procedure: LEFT TOTAL HIP ARTHROPLASTY ANTERIOR APPROACH;  Surgeon: Kathryne Hitchhristopher Y Blackman, MD;  [facility];  Service: Orthopedics;  Laterality: Left;  . TUBAL LIGATION      There were no vitals filed for this visit.      Subjective Assessment - 07/15/16 0932    Subjective really felt better after last session with deep STW   Currently in Pain? Yes   Pain Score 1    Pain Location Ankle   Pain Orientation Right                         [facility] Adult PT Treatment/Exercise - 07/15/16 0001      Exercises   Exercises Knee/Hip;Ankle     Knee/Hip Exercises: Aerobic   Nustep L 6 6 min     Knee/Hip Exercises: Machines for Strengthening   Cybex Leg Press calf raises 30# 3 sets 10     Knee/Hip Exercises: Standing   Walking with Sports Cord 50# fwd and back 5 times each   Other Standing Knee Exercises dead lifts 8# 2 sets 10     Manual Therapy   Manual Therapy Joint  mobilization;Soft tissue mobilization   Manual therapy comments deep STW to calf and achilles   Soft tissue mobilization achilles   Passive ROM ankle     Ankle Exercises: Stretches   Soleus Stretch 3 reps;10 seconds  pro stretch   Gastroc Stretch 3 reps;10 seconds  pro stretch     Ankle Exercises: Standing   Other Standing Ankle Exercises airex mini squat into toe raise 20 times   Other Standing Ankle Exercises black bar heel raises 20,toe raises 20                PT Education - 07/15/16 0948    Education provided Yes   Education Details proper gait to decrease compensation even with less pain pt has poor gait d/t habit   Person(s) Educated Patient   Methods Explanation   Comprehension Verbalized understanding;Returned demonstration;Verbal cues required          PT Short Term Goals - 07/08/16 1113      PT SHORT TERM GOAL #1   Title independent with intial HEP   Time 1   Period Weeks   Status Achieved  PT Long Term Goals - 07/11/16 1040      PT LONG TERM GOAL #1   Title decrease pain 50%   Baseline per pt pain was an 8 now a 5 so some better   Status On-going     PT LONG TERM GOAL #2   Title increase DF to 10 degrees   Baseline Left DF after MT and ther ex 9   Status On-going     PT LONG TERM GOAL #3   Title walk without difficulty   Status On-going     PT LONG TERM GOAL #4   Title increase inversion and eversion strength to 4/5   Status On-going     PT LONG TERM GOAL #5   Title understand RICE   Status Achieved               Plan - 07/15/16 1012    Clinical Impression Statement decreased tenderness in calf esp laterally but large knot in soleus. with cuing gait improved. tol ex well.   PT Next Visit Plan deep STW      Patient will benefit from skilled therapeutic intervention in order to improve the following deficits and impairments:  Abnormal gait, Decreased balance, Decreased mobility, Decreased range of motion,  Decreased strength, Difficulty walking, Impaired flexibility, Pain  Visit Diagnosis: Pain in right ankle and joints of right foot  Pain in right foot  Difficulty in walking, not elsewhere classified     Problem List Patient Active Problem List   Diagnosis Date Noted  . Osteoarthritis of left hip 09/07/2015  . Status post total replacement of left hip 09/07/2015  . Lumbar disc herniation with radiculopathy 05/29/2015    Class: Acute  . Spondylolisthesis of lumbar region 05/29/2015    Class: Chronic    Yanna Leaks,ANGIE PTA 07/15/2016, 10:13 AM  [facility] [address] [place], [place], [address] Phone: [phone]   Fax:  [phone]  Name: Bianca Ware MRN: [id number] Date of Birth: 06/12/1946

## 2016-07-17 ENCOUNTER — Encounter: Payer: Self-pay | Admitting: Physical Therapy

## 2016-07-17 ENCOUNTER — Ambulatory Visit: Payer: Medicare Other | Admitting: Physical Therapy

## 2016-07-17 DIAGNOSIS — M79671 Pain in right foot: Secondary | ICD-10-CM

## 2016-07-17 DIAGNOSIS — M25571 Pain in right ankle and joints of right foot: Secondary | ICD-10-CM | POA: Diagnosis not present

## 2016-07-17 DIAGNOSIS — R262 Difficulty in walking, not elsewhere classified: Secondary | ICD-10-CM

## 2016-07-17 NOTE — Therapy (Signed)
[facility] [address] [place], [place], [address] Phone: [phone]   Fax:  [phone]  Physical Therapy Treatment  Patient Details  Name: Bianca Ware MRN: [id number] Date of Birth: 1946/01/26 Referring Provider: Magnus Ivan  Encounter Date: 07/17/2016      PT End of Session - 07/17/16 0953    Visit Number 7   Date for PT Re-Evaluation 08/24/16   PT Start Time 0930   PT Stop Time 1020   PT Time Calculation (min) 50 min      Past Medical History:  Diagnosis Date  . Arthritis   . Complication of anesthesia    waking up  . PONV (postoperative nausea and vomiting)     Past Surgical History:  Procedure Laterality Date  . ABDOMINAL HYSTERECTOMY    . DILATION AND CURETTAGE OF UTERUS    . LUMBAR LAMINECTOMY N/A 05/29/2015   Procedure: LEFT L4-5 MICRODISCECTOMY;  Surgeon: Kerrin Champagne, MD;  [facility];  Service: Orthopedics;  Laterality: N/A;  . TONSILLECTOMY    . TOTAL HIP ARTHROPLASTY Left 09/07/2015   Procedure: LEFT TOTAL HIP ARTHROPLASTY ANTERIOR APPROACH;  Surgeon: Kathryne Hitch, MD;  [facility];  Service: Orthopedics;  Laterality: Left;  . TUBAL LIGATION      There were no vitals filed for this visit.      Subjective Assessment - 07/17/16 0927    Subjective cramped up the night of last session but overall better and walking better   Pain Score 2    Pain Location Heel   Pain Orientation Right                         [facility] Adult PT Treatment/Exercise - 07/17/16 0001      Knee/Hip Exercises: Aerobic   Elliptical I 10 R 4 3 min   Tread Mill off push back 20 times   Nustep L 6 6 min     Knee/Hip Exercises: Machines for Strengthening   Cybex Leg Press calf raises 30# 3 sets 10   Total Gym Leg Press 40# 3 sets 10 , feet 3 ways     Knee/Hip Exercises: Standing   Forward Step Up 2 sets;10 reps;Hand Hold: 1;Step Height: 4"  up/over/rev   Other Standing Knee  Exercises airex heel/toe and marching 15 each     Manual Therapy   Manual Therapy Joint mobilization;Soft tissue mobilization   Manual therapy comments deep STW to calf and achilles   Soft tissue mobilization achilles   Passive ROM ankle                  PT Short Term Goals - 07/08/16 1113      PT SHORT TERM GOAL #1   Title independent with intial HEP   Time 1   Period Weeks   Status Achieved           PT Long Term Goals - 07/17/16 0950      PT LONG TERM GOAL #1   Title decrease pain 50%   Baseline 85%   Status Achieved     PT LONG TERM GOAL #2   Title increase DF to 10 degrees   Baseline 22 degrees   Status Achieved     PT LONG TERM GOAL #3   Title walk without difficulty   Status On-going     PT LONG TERM GOAL #4   Title increase inversion and eversion  strength to 4/5   Status On-going     PT LONG TERM GOAL #5   Title understand [place]   Status Achieved               Plan - 07/17/16 0954    Clinical Impression Statement ROM and pain goal met, pt responding very well to DSTW an dvery pleased with less pain and increased func   PT Next Visit Plan eccentric ex/gait and MT      Patient will benefit from skilled therapeutic intervention in order to improve the following deficits and impairments:  Abnormal gait, Decreased balance, Decreased mobility, Decreased range of motion, Decreased strength, Difficulty walking, Impaired flexibility, Pain  Visit Diagnosis: Pain in right ankle and joints of right foot  Pain in right foot  Difficulty in walking, not elsewhere classified     Problem List Patient Active Problem List   Diagnosis Date Noted  . Osteoarthritis of left hip 09/07/2015  . Status post total replacement of left hip 09/07/2015  . Lumbar disc herniation with radiculopathy 05/29/2015    Class: Acute  . Spondylolisthesis of lumbar region 05/29/2015    Class: Chronic    Brandis Matsuura,ANGIE PTA 07/17/2016, 9:55 AM  [place], [place], [address] Phone: [phone]   Fax:  [phone]  Name: Bianca Ware MRN: [id number] Date of Birth: Apr 26, 1946

## 2016-07-22 ENCOUNTER — Ambulatory Visit: Payer: Medicare Other | Admitting: Physical Therapy

## 2016-07-22 DIAGNOSIS — M79671 Pain in right foot: Secondary | ICD-10-CM

## 2016-07-22 DIAGNOSIS — M25571 Pain in right ankle and joints of right foot: Secondary | ICD-10-CM | POA: Diagnosis not present

## 2016-07-22 DIAGNOSIS — R262 Difficulty in walking, not elsewhere classified: Secondary | ICD-10-CM

## 2016-07-22 NOTE — Therapy (Signed)
[facility] [address] [place], [place], [address] Phone: [phone]   Fax:  [phone]  Physical Therapy Treatment  Patient Details  Name: Bianca Ware MRN: [id number] Date of Birth: 06/01/1946 Referring Provider: Magnus IvanBlackman  Encounter Date: 07/22/2016      PT End of Session - 07/22/16 1058    Visit Number 8   Date for PT Re-Evaluation 08/24/16   PT Start Time 1015   PT Stop Time 1100   PT Time Calculation (min) 45 min      Past Medical History:  Diagnosis Date  . Arthritis   . Complication of anesthesia    waking up  . PONV (postoperative nausea and vomiting)     Past Surgical History:  Procedure Laterality Date  . ABDOMINAL HYSTERECTOMY    . DILATION AND CURETTAGE OF UTERUS    . LUMBAR LAMINECTOMY N/A 05/29/2015   Procedure: LEFT L4-5 MICRODISCECTOMY;  Surgeon: Kerrin ChampagneJames E Nitka, MD;  [facility];  Service: Orthopedics;  Laterality: N/A;  . TONSILLECTOMY    . TOTAL HIP ARTHROPLASTY Left 09/07/2015   Procedure: LEFT TOTAL HIP ARTHROPLASTY ANTERIOR APPROACH;  Surgeon: Kathryne Hitchhristopher Y Blackman, MD;  [facility];  Service: Orthopedics;  Laterality: Left;  . TUBAL LIGATION      There were no vitals filed for this visit.      Subjective Assessment - 07/22/16 1016    Subjective achilles sore but no calf pain   Currently in Pain? Yes   Pain Score 2    Pain Location Heel   Pain Orientation Right                         [facility] Adult PT Treatment/Exercise - 07/22/16 0001      Knee/Hip Exercises: Aerobic   Elliptical 2 fwd/2 back   Tread Mill OFF PUSH /PULL 20 RT   Nustep L 6 6 min     Knee/Hip Exercises: Machines for Strengthening   Cybex Leg Press calf raises 40# 3 sets 10   Total Gym Leg Press 40# 3 sets 10 , feet 3 ways     Knee/Hip Exercises: Standing   Forward Step Up Right;2 sets;10 reps;Hand Hold: 2  BOSU   Rocker Board 3 minutes     Manual Therapy   Manual  Therapy Joint mobilization;Soft tissue mobilization   Manual therapy comments deep STW to calf and achilles   Soft tissue mobilization achilles   Passive ROM ankle                  PT Short Term Goals - 07/08/16 1113      PT SHORT TERM GOAL #1   Title independent with intial HEP   Time 1   Period Weeks   Status Achieved           PT Long Term Goals - 07/17/16 0950      PT LONG TERM GOAL #1   Title decrease pain 50%   Baseline 85%   Status Achieved     PT LONG TERM GOAL #2   Title increase DF to 10 degrees   Baseline 22 degrees   Status Achieved     PT LONG TERM GOAL #3   Title walk without difficulty   Status On-going     PT LONG TERM GOAL #4   Title increase inversion and eversion strength to 4/5   Status On-going     PT  LONG TERM GOAL #5   Title understand RICE   Status Achieved               Plan - 07/22/16 1058    Clinical Impression Statement decreased tenderness and no knot noted in calf,tenderness and swelling at achilles incertion. Tolerated ther ex well.   PT Next Visit Plan Assess goals      Patient will benefit from skilled therapeutic intervention in order to improve the following deficits and impairments:  Abnormal gait, Decreased balance, Decreased mobility, Decreased range of motion, Decreased strength, Difficulty walking, Impaired flexibility, Pain  Visit Diagnosis: Pain in right ankle and joints of right foot  Pain in right foot  Difficulty in walking, not elsewhere classified     Problem List Patient Active Problem List   Diagnosis Date Noted  . Osteoarthritis of left hip 09/07/2015  . Status post total replacement of left hip 09/07/2015  . Lumbar disc herniation with radiculopathy 05/29/2015    Class: Acute  . Spondylolisthesis of lumbar region 05/29/2015    Class: Chronic    PAYSEUR,ANGIE PTA 07/22/2016, 11:00 AM  [facility] [address] [place], [place], [address] Phone: [phone]   Fax:  [phone]  Name: Bianca Ware MRN: [id number] Date of Birth: 1946-01-29

## 2016-07-24 ENCOUNTER — Ambulatory Visit: Payer: Medicare Other | Admitting: Physical Therapy

## 2016-07-24 ENCOUNTER — Encounter: Payer: Self-pay | Admitting: Physical Therapy

## 2016-07-24 DIAGNOSIS — M25571 Pain in right ankle and joints of right foot: Secondary | ICD-10-CM | POA: Diagnosis not present

## 2016-07-24 DIAGNOSIS — R262 Difficulty in walking, not elsewhere classified: Secondary | ICD-10-CM

## 2016-07-24 NOTE — Therapy (Signed)
[facility] [address] [place], [place], [address] Phone: [phone]   Fax:  [phone]  Physical Therapy Treatment  Patient Details  Name: Bianca Sellheresa K Lapka MRN: [id number] Date of Birth: 07/30/1946 Referring Provider: Magnus IvanBlackman  Encounter Date: 07/24/2016      PT End of Session - 07/24/16 1150    Visit Number 9   Date for PT Re-Evaluation 08/24/16   PT Start Time 1100   PT Stop Time 1150   PT Time Calculation (min) 50 min      Past Medical History:  Diagnosis Date  . Arthritis   . Complication of anesthesia    waking up  . PONV (postoperative nausea and vomiting)     Past Surgical History:  Procedure Laterality Date  . ABDOMINAL HYSTERECTOMY    . DILATION AND CURETTAGE OF UTERUS    . LUMBAR LAMINECTOMY N/A 05/29/2015   Procedure: LEFT L4-5 MICRODISCECTOMY;  Surgeon: Kerrin ChampagneJames E Nitka, MD;  [facility]: [facility];  Service: Orthopedics;  Laterality: N/A;  . TONSILLECTOMY    . TOTAL HIP ARTHROPLASTY Left 09/07/2015   Procedure: LEFT TOTAL HIP ARTHROPLASTY ANTERIOR APPROACH;  Surgeon: Kathryne Hitchhristopher Y Blackman, MD;  [facility];  Service: Orthopedics;  Laterality: Left;  . TUBAL LIGATION      There were no vitals filed for this visit.      Subjective Assessment - 07/24/16 1101    Subjective post heel ache, calf really good.   Currently in Pain? Yes   Pain Score 2                          [facility] Adult PT Treatment/Exercise - 07/24/16 0001      Knee/Hip Exercises: Aerobic   Elliptical 3 fwd/3 back L 4 I 10   Tread Mill 3 min on incline 14   Nustep L 6 5 min     Knee/Hip Exercises: Machines for Strengthening   Cybex Leg Press calf raises 40# 3 sets 10   Total Gym Leg Press 40# 3 sets 10 , feet 3 ways     Knee/Hip Exercises: Standing   Other Standing Knee Exercises trampoline step up fwd and sideways 10 times each   Other Standing Knee Exercises prostretch and black bar      Iontophoresis   Location Rt lat achiiles   Dose 1.2 cc   Time 4 hour patch     Manual Therapy   Manual Therapy Joint mobilization;Soft tissue mobilization   Manual therapy comments deep STW to calf and achilles   Soft tissue mobilization achilles   Passive ROM ankle                  PT Short Term Goals - 07/08/16 1113      PT SHORT TERM GOAL #1   Title independent with intial HEP   Time 1   Period Weeks   Status Achieved           PT Long Term Goals - 07/24/16 1125      PT LONG TERM GOAL #1   Title decrease pain 50%   Status Achieved     PT LONG TERM GOAL #2   Title increase DF to 10 degrees   Status Achieved     PT LONG TERM GOAL #3   Title walk without difficulty   Status On-going     PT LONG TERM GOAL #4   Title increase inversion  and eversion strength to 4/5   Status Achieved               Plan - 07/24/16 1150    Clinical Impression Statement very tender lateral achilles ( trial of ionto) Good ROM and strength. Improved func and ex tolerance.  Antalgic gait esp with fatigue. Progressing with goals.   PT Next Visit Plan Progress gait,assess ionto      Patient will benefit from skilled therapeutic intervention in order to improve the following deficits and impairments:  Abnormal gait, Decreased balance, Decreased mobility, Decreased range of motion, Decreased strength, Difficulty walking, Impaired flexibility, Pain  Visit Diagnosis: Pain in right ankle and joints of right foot  Difficulty in walking, not elsewhere classified     Problem List Patient Active Problem List   Diagnosis Date Noted  . Osteoarthritis of left hip 09/07/2015  . Status post total replacement of left hip 09/07/2015  . Lumbar disc herniation with radiculopathy 05/29/2015    Class: Acute  . Spondylolisthesis of lumbar region 05/29/2015    Class: Chronic    PAYSEUR,ANGIE PTA 07/24/2016, 11:52 AM  [facility] [place]  [facility] [address] [place], [place], [address] Phone: [phone]   Fax:  [phone]  Name: Bianca Ware MRN: [id number] Date of Birth: 03-09-1946

## 2016-07-31 ENCOUNTER — Ambulatory Visit: Payer: Medicare Other | Attending: Orthopaedic Surgery | Admitting: Physical Therapy

## 2016-07-31 ENCOUNTER — Encounter: Payer: Self-pay | Admitting: Physical Therapy

## 2016-07-31 DIAGNOSIS — M79671 Pain in right foot: Secondary | ICD-10-CM | POA: Insufficient documentation

## 2016-07-31 DIAGNOSIS — R262 Difficulty in walking, not elsewhere classified: Secondary | ICD-10-CM | POA: Insufficient documentation

## 2016-07-31 DIAGNOSIS — M25571 Pain in right ankle and joints of right foot: Secondary | ICD-10-CM | POA: Insufficient documentation

## 2016-07-31 NOTE — Therapy (Signed)
[facility] [address] [place], [place], [address] Phone: [phone]   Fax:  [phone]  Physical Therapy Treatment  Patient Details  Name: Bianca Ware MRN: [id number] Date of Birth: 11/19/1946 Referring Provider: Magnus Ivan  Encounter Date: 07/31/2016      PT End of Session - 07/31/16 1536    Visit Number 10   Date for PT Re-Evaluation 08/24/16   PT Start Time 1530   PT Stop Time 1615   PT Time Calculation (min) 45 min      Past Medical History:  Diagnosis Date  . Arthritis   . Complication of anesthesia    waking up  . PONV (postoperative nausea and vomiting)     Past Surgical History:  Procedure Laterality Date  . ABDOMINAL HYSTERECTOMY    . DILATION AND CURETTAGE OF UTERUS    . LUMBAR LAMINECTOMY N/A 05/29/2015   Procedure: LEFT L4-5 MICRODISCECTOMY;  Surgeon: Kerrin Champagne, MD;  [facility];  Service: Orthopedics;  Laterality: N/A;  . TONSILLECTOMY    . TOTAL HIP ARTHROPLASTY Left 09/07/2015   Procedure: LEFT TOTAL HIP ARTHROPLASTY ANTERIOR APPROACH;  Surgeon: Kathryne Hitch, MD;  [facility]: [facility];  Service: Orthopedics;  Laterality: Left;  . TUBAL LIGATION      There were no vitals filed for this visit.      Subjective Assessment - 07/31/16 1531    Subjective late in afternoon tired in calf and heel pain   Currently in Pain? Yes   Pain Score 3    Pain Location Heel   Pain Orientation Right                         [facility] Adult PT Treatment/Exercise - 07/31/16 0001      Knee/Hip Exercises: Aerobic   Elliptical 3 fwd/3 back L 5 I 8     Knee/Hip Exercises: Machines for Strengthening   Total Gym Leg Press 40# 3 sets 10      Manual Therapy   Manual Therapy Joint mobilization;Soft tissue mobilization   Manual therapy comments deep STW to calf and achilles   Soft tissue mobilization achilles   Passive ROM ankle     Ankle Exercises: Standing   Other Standing  Ankle Exercises black bar heel raises 20,toe raises 20     Ankle Exercises: Stretches   Soleus Stretch 3 reps;10 seconds   Gastroc Stretch 3 reps;10 seconds                  PT Short Term Goals - 07/08/16 1113      PT SHORT TERM GOAL #1   Title independent with intial HEP   Time 1   Period Weeks   Status Achieved           PT Long Term Goals - 07/31/16 1614      PT LONG TERM GOAL #3   Title walk without difficulty   Status On-going               Plan - 07/31/16 1613    Clinical Impression Statement pt ususally attends PT in morning and this afternoon she arrives with limp and increased pain,verb she is always like this at end of day. knots and tenderness RT lat calf and into ankle/achilles. After STW felt much better and less limp.   PT Next Visit Plan DRY NEEDLING next friday to see if can get longer relief and  gets knots to break up      Patient will benefit from skilled therapeutic intervention in order to improve the following deficits and impairments:  Abnormal gait, Decreased balance, Decreased mobility, Decreased range of motion, Decreased strength, Difficulty walking, Impaired flexibility, Pain  Visit Diagnosis: Pain in right ankle and joints of right foot  Difficulty in walking, not elsewhere classified     Problem List Patient Active Problem List   Diagnosis Date Noted  . Osteoarthritis of left hip 09/07/2015  . Status post total replacement of left hip 09/07/2015  . Lumbar disc herniation with radiculopathy 05/29/2015    Class: Acute  . Spondylolisthesis of lumbar region 05/29/2015    Class: Chronic    Forrest Demuro,ANGIE PTA 07/31/2016, 4:15 PM  [facility] [address] [place], [place], [address] Phone: [phone]   Fax:  [phone]  Name: Bianca Ware MRN: [id number] Date of Birth: 10/12/1946

## 2016-08-05 ENCOUNTER — Ambulatory Visit: Payer: Medicare Other | Admitting: Physical Therapy

## 2016-08-05 ENCOUNTER — Encounter: Payer: Self-pay | Admitting: Physical Therapy

## 2016-08-05 DIAGNOSIS — M25571 Pain in right ankle and joints of right foot: Secondary | ICD-10-CM | POA: Diagnosis not present

## 2016-08-05 DIAGNOSIS — R262 Difficulty in walking, not elsewhere classified: Secondary | ICD-10-CM

## 2016-08-05 NOTE — Therapy (Signed)
[facility] [address] [place], [place], [address] Phone: [phone]   Fax:  [phone]  Physical Therapy Treatment  Patient Details  Name: Bianca Ware MRN: [id number] Date of Birth: July 30, 1946 Referring Provider: Magnus Ivan  Encounter Date: 08/05/2016      PT End of Session - 08/05/16 1515    Visit Number 11   Date for PT Re-Evaluation 08/24/16   PT Start Time 1435   PT Stop Time 1515   PT Time Calculation (min) 40 min      Past Medical History:  Diagnosis Date  . Arthritis   . Complication of anesthesia    waking up  . PONV (postoperative nausea and vomiting)     Past Surgical History:  Procedure Laterality Date  . ABDOMINAL HYSTERECTOMY    . DILATION AND CURETTAGE OF UTERUS    . LUMBAR LAMINECTOMY N/A 05/29/2015   Procedure: LEFT L4-5 MICRODISCECTOMY;  Surgeon: Kerrin Champagne, MD;  [facility];  Service: Orthopedics;  Laterality: N/A;  . TONSILLECTOMY    . TOTAL HIP ARTHROPLASTY Left 09/07/2015   Procedure: LEFT TOTAL HIP ARTHROPLASTY ANTERIOR APPROACH;  Surgeon: Kathryne Hitch, MD;  [facility];  Service: Orthopedics;  Laterality: Left;  . TUBAL LIGATION      There were no vitals filed for this visit.      Subjective Assessment - 08/05/16 1433    Subjective yesterday was just awful, this pain will just not go away   Currently in Pain? Yes   Pain Score 6    Pain Location Heel   Pain Orientation Right                         [facility] Adult PT Treatment/Exercise - 08/05/16 0001      Electrical Stimulation   Electrical Stimulation Location RT lat calf/achilles   Electrical Stimulation Parameters IFC   Electrical Stimulation Goals Pain;Other (comment)  tightness     Ultrasound   Ultrasound Location RT calf/combo   Ultrasound Parameters 10 min 1 mhz 1.4 w/cm @   Ultrasound Goals Other (Comment)  muscle tightness     Manual Therapy   Manual Therapy Joint  mobilization;Soft tissue mobilization   Manual therapy comments deep STW to calf and achilles   Soft tissue mobilization achilles   Passive ROM ankle/foot                  PT Short Term Goals - 07/08/16 1113      PT SHORT TERM GOAL #1   Title independent with intial HEP   Time 1   Period Weeks   Status Achieved           PT Long Term Goals - 07/31/16 1614      PT LONG TERM GOAL #3   Title walk without difficulty   Status On-going               Plan - 08/05/16 1515    Clinical Impression Statement large knots and very painful in RT lat calf,responded well with deep STW and combo tx. Pt felt significant decrease in pain after session.   PT Next Visit Plan Dry needling -hopefully will see increased lasting results.      Patient will benefit from skilled therapeutic intervention in order to improve the following deficits and impairments:  Abnormal gait, Decreased balance, Decreased mobility, Decreased range of motion, Decreased strength, Difficulty walking, Impaired flexibility,  Pain  Visit Diagnosis: Pain in right ankle and joints of right foot  Difficulty in walking, not elsewhere classified     Problem List Patient Active Problem List   Diagnosis Date Noted  . Osteoarthritis of left hip 09/07/2015  . Status post total replacement of left hip 09/07/2015  . Lumbar disc herniation with radiculopathy 05/29/2015    Class: Acute  . Spondylolisthesis of lumbar region 05/29/2015    Class: Chronic    Bianca Ware,Bianca Ware PTA 08/05/2016, 3:17 PM  [facility] [address] [place], [place], [address] Phone: [phone]   Fax:  [phone]  Name: Bianca Ware MRN: [id number] Date of Birth: 12/07/1945

## 2016-08-08 ENCOUNTER — Ambulatory Visit: Payer: Medicare Other | Admitting: Physical Therapy

## 2016-08-08 DIAGNOSIS — M79671 Pain in right foot: Secondary | ICD-10-CM

## 2016-08-08 DIAGNOSIS — M25571 Pain in right ankle and joints of right foot: Secondary | ICD-10-CM | POA: Diagnosis not present

## 2016-08-08 DIAGNOSIS — R262 Difficulty in walking, not elsewhere classified: Secondary | ICD-10-CM

## 2016-08-08 NOTE — Therapy (Signed)
[facility] [address] [place], [place], [address] Phone: [phone]   Fax:  [phone]  Physical Therapy Treatment  Patient Details  Name: Bianca Ware MRN: [id number] Date of Birth: Oct 31, 1946 Referring Provider: Magnus Ivan  Encounter Date: 08/08/2016      PT End of Session - 08/08/16 1254    Visit Number 12   Date for PT Re-Evaluation 08/24/16   PT Start Time 0932   PT Stop Time 1022   PT Time Calculation (min) 50 min   Activity Tolerance Patient tolerated treatment well   Behavior During Therapy [facility] for tasks assessed/performed      Past Medical History:  Diagnosis Date  . Arthritis   . Complication of anesthesia    waking up  . PONV (postoperative nausea and vomiting)     Past Surgical History:  Procedure Laterality Date  . ABDOMINAL HYSTERECTOMY    . DILATION AND CURETTAGE OF UTERUS    . LUMBAR LAMINECTOMY N/A 05/29/2015   Procedure: LEFT L4-5 MICRODISCECTOMY;  Surgeon: Kerrin Champagne, MD;  [facility];  Service: Orthopedics;  Laterality: N/A;  . TONSILLECTOMY    . TOTAL HIP ARTHROPLASTY Left 09/07/2015   Procedure: LEFT TOTAL HIP ARTHROPLASTY ANTERIOR APPROACH;  Surgeon: Kathryne Hitch, MD;  [facility];  Service: Orthopedics;  Laterality: Left;  . TUBAL LIGATION      There were no vitals filed for this visit.      Subjective Assessment - 08/08/16 0938    Subjective "The heel/ calf continues to have pain with referral to the medial aspect of the heel, it feels good for only a day following treatment but it comes right back"   Currently in Pain? Yes   Pain Score 3    Pain Location Heel   Pain Orientation Right   Pain Descriptors / Indicators Sharp;Stabbing;Burning   Pain Type Chronic pain   Pain Onset More than a month ago   Pain Frequency Intermittent   Aggravating Factors  walking/ standing, intermittent depending on activity, unknown what causes it   Pain Relieving Factors  resting, ointment, max freeze, heat                         [facility] Adult PT Treatment/Exercise - 08/08/16 0001      Moist Heat Therapy   Number Minutes Moist Heat 10 Minutes   Moist Heat Location Other (comment)  R calf in prone     Manual Therapy   Manual Therapy Taping   Soft tissue mobilization IASTM along R poximal / mid calf while sustained calf stretch, Also over tibialis posterior   McConnell medial arch taping trial      Ankle Exercises: Stretches   Gastroc Stretch 4 reps;30 seconds  in prone, contract/relax with 10 sec hold          Trigger Point Dry Needling - 08/08/16 1303    Consent Given? Yes   Education Handout Provided Yes   Muscles Treated Lower Body Gastrocnemius;Soleus  tibialis posterior x 1 with pistoning   Gastrocnemius Response Twitch response elicited;Palpable increased muscle length  on R x 3 with pistoning    Soleus Response Twitch response elicited;Palpable increased muscle length  on R x 2 with pistoning              PT Education - 08/08/16 1253    Education provided Yes   Education Details anatomy in regard to formation of trigger points, benefits  of TPDN, mechanics of treatment, what to expect and after care. Arch taping to relieve tension on tibialis posteior and how long to wear tape.    Person(s) Educated Patient   Methods Explanation;Verbal cues   Comprehension Verbalized understanding;Verbal cues required          PT Short Term Goals - 07/08/16 1113      PT SHORT TERM GOAL #1   Title independent with intial HEP   Time 1   Period Weeks   Status Achieved           PT Long Term Goals - 07/31/16 1614      PT LONG TERM GOAL #3   Title walk without difficulty   Status On-going               Plan - 08/08/16 1256    Clinical Impression Statement Mrs. Domenic SchwabDonovan demonstrates significant tightness in the R gastroc/soleus as well as soreness in the tibialis posterior. upon further assessment pt  demonstrates moderate arch drop and reports pain inthe medial calf when walking without shoes.  TPDN was perform on medial/lateral aspects gastroc/ soleus as well as tibialis poster. following soft tissue work and arch taping pt reported no pain and was able to stand without her shoes and walk without issue.    PT Next Visit Plan Assess response to DN/ arch taping, PNF stretching, active release techniques, posterior tib STW, Modalities PRN for tightness/ pain   Consulted and Agree with Plan of Care Patient      Patient will benefit from skilled therapeutic intervention in order to improve the following deficits and impairments:  Abnormal gait, Decreased balance, Decreased mobility, Decreased range of motion, Decreased strength, Difficulty walking, Impaired flexibility, Pain  Visit Diagnosis: Pain in right ankle and joints of right foot  Difficulty in walking, not elsewhere classified  Pain in right foot     Problem List Patient Active Problem List   Diagnosis Date Noted  . Osteoarthritis of left hip 09/07/2015  . Status post total replacement of left hip 09/07/2015  . Lumbar disc herniation with radiculopathy 05/29/2015    Class: Acute  . Spondylolisthesis of lumbar region 05/29/2015    Class: Chronic   Lulu RidingKristoffer Ardith Test PT, DPT, LAT, ATC  08/08/16  1:07 PM      [facility] [address] [place], [place], [address] Phone: [phone]   Fax:  [phone]  Name: Lance Sellheresa K Soo MRN: [id number] Date of Birth: 08/17/1946

## 2016-08-11 ENCOUNTER — Ambulatory Visit: Payer: Medicare Other | Admitting: Physical Therapy

## 2016-08-11 DIAGNOSIS — R262 Difficulty in walking, not elsewhere classified: Secondary | ICD-10-CM

## 2016-08-11 DIAGNOSIS — M25571 Pain in right ankle and joints of right foot: Secondary | ICD-10-CM | POA: Diagnosis not present

## 2016-08-11 DIAGNOSIS — M79671 Pain in right foot: Secondary | ICD-10-CM

## 2016-08-11 NOTE — Therapy (Signed)
[facility] [address] [place], [place], [address] Phone: [phone]   Fax:  [phone]  Physical Therapy Treatment  Patient Details  Name: Bianca Ware MRN: [id number] Date of Birth: 01/26/1946 Referring Provider: Magnus IvanBlackman  Encounter Date: 08/11/2016      PT End of Session - 08/11/16 1528    Visit Number 13   Date for PT Re-Evaluation 08/24/16   PT Start Time 1415   PT Stop Time 1501   PT Time Calculation (min) 46 min   Activity Tolerance Patient tolerated treatment well   Behavior During Therapy [facility] for tasks assessed/performed      Past Medical History:  Diagnosis Date  . Arthritis   . Complication of anesthesia    waking up  . PONV (postoperative nausea and vomiting)     Past Surgical History:  Procedure Laterality Date  . ABDOMINAL HYSTERECTOMY    . DILATION AND CURETTAGE OF UTERUS    . LUMBAR LAMINECTOMY N/A 05/29/2015   Procedure: LEFT L4-5 MICRODISCECTOMY;  Surgeon: Kerrin ChampagneJames E Nitka, MD;  [facility]: [facility];  Service: Orthopedics;  Laterality: N/A;  . TONSILLECTOMY    . TOTAL HIP ARTHROPLASTY Left 09/07/2015   Procedure: LEFT TOTAL HIP ARTHROPLASTY ANTERIOR APPROACH;  Surgeon: Kathryne Hitchhristopher Y Blackman, MD;  [facility];  Service: Orthopedics;  Laterality: Left;  . TUBAL LIGATION      There were no vitals filed for this visit.      Subjective Assessment - 08/11/16 1426    Subjective "Some tightness inthe calf, but not as bad as it was. The taping in the arch really helped and it stayed on for about 48 hours"    Currently in Pain? Yes   Pain Score 1    Pain Location Heel   Pain Orientation Right   Pain Descriptors / Indicators Tightness   Pain Type Chronic pain   Pain Onset More than a month ago   Pain Frequency Intermittent                         [facility] Adult PT Treatment/Exercise - 08/11/16 0001      Ultrasound   Ultrasound Location Rt middle gastroc   Ultrasound Parameters  100% 1Mhz 1.2 w/cm2 x 8 min   Ultrasound Goals Other (Comment)  muscle tightness     Manual Therapy   Soft tissue mobilization IASTM along R poximal / mid calf while sustained calf stretch, Also over tibialis posterior   McConnell medial arch taping trial      Ankle Exercises: Stretches   Gastroc Stretch 4 reps;30 seconds  PNF contract/ relax witn 10 sec hold   Other Stretch posterior tib stretch 2 x 30 sec hold     Ankle Exercises: Seated   Towel Crunch 5 reps          Trigger Point Dry Needling - 08/11/16 1514    Consent Given? Yes   Education Handout Provided Yes  given previously   Muscles Treated Lower Body --  posterior tib x 1 with pistoning/ twisting technique   Gastrocnemius Response Twitch response elicited;Palpable increased muscle length  x 3 laterally, x 2 medially   Soleus Response Twitch response elicited;Palpable increased muscle length  x 2 medially                PT Short Term Goals - 07/08/16 1113      PT SHORT TERM GOAL #1   Title independent with intial HEP  Time 1   Period Weeks   Status Achieved           PT Long Term Goals - 07/31/16 1614      PT LONG TERM GOAL #3   Title walk without difficulty   Status On-going               Plan - 08/11/16 1517    Clinical Impression Statement Mrs. Dimitrov reports decreased tightness following last session. DN was performed on R gastroc/ soleus and post tib. continued taping for arch support and provied handout for towel scrunches. Follwoing todays sesison she reports only sorness fron DN. pt declined MHP post session.    PT Next Visit Plan Assess response to DN/ arch taping, PNF stretching, active release techniques, posterior tib STW, Modalities PRN for tightness/ pain, stair training, stretching on step.    Consulted and Agree with Plan of Care Patient      Patient will benefit from skilled therapeutic intervention in order to improve the following deficits and impairments:   Abnormal gait, Decreased balance, Decreased mobility, Decreased range of motion, Decreased strength, Difficulty walking, Impaired flexibility, Pain  Visit Diagnosis: Pain in right ankle and joints of right foot  Difficulty in walking, not elsewhere classified  Pain in right foot     Problem List Patient Active Problem List   Diagnosis Date Noted  . Osteoarthritis of left hip 09/07/2015  . Status post total replacement of left hip 09/07/2015  . Lumbar disc herniation with radiculopathy 05/29/2015    Class: Acute  . Spondylolisthesis of lumbar region 05/29/2015    Class: Chronic   Lulu Riding PT, DPT, LAT, ATC  08/11/16  3:29 PM      [facility] [address] [place], [place], [address] Phone: [phone]   Fax:  [phone]  Name: Bianca Ware MRN: [id number] Date of Birth: [birth year]

## 2016-08-12 ENCOUNTER — Ambulatory Visit: Payer: Medicare Other | Admitting: Physical Therapy

## 2016-08-15 ENCOUNTER — Ambulatory Visit: Payer: Medicare Other | Admitting: Physical Therapy

## 2016-08-15 DIAGNOSIS — M79671 Pain in right foot: Secondary | ICD-10-CM

## 2016-08-15 DIAGNOSIS — M25571 Pain in right ankle and joints of right foot: Secondary | ICD-10-CM

## 2016-08-15 DIAGNOSIS — R262 Difficulty in walking, not elsewhere classified: Secondary | ICD-10-CM

## 2016-08-15 NOTE — Therapy (Signed)
[facility] [address] [place], [place], [address] Phone: [phone]   Fax:  [phone]  Physical Therapy Treatment  Patient Details  Name: Bianca Ware MRN: [id number] Date of Birth: 05/31/1946 Referring Provider: Magnus IvanBlackman  Encounter Date: 08/15/2016      PT End of Session - 08/15/16 1028    Visit Number 14   Date for PT Re-Evaluation 08/24/16   PT Start Time 0852   PT Stop Time 0932   PT Time Calculation (min) 40 min   Activity Tolerance Patient tolerated treatment well   Behavior During Therapy [facility] for tasks assessed/performed      Past Medical History:  Diagnosis Date  . Arthritis   . Complication of anesthesia    waking up  . PONV (postoperative nausea and vomiting)     Past Surgical History:  Procedure Laterality Date  . ABDOMINAL HYSTERECTOMY    . DILATION AND CURETTAGE OF UTERUS    . LUMBAR LAMINECTOMY N/A 05/29/2015   Procedure: LEFT L4-5 MICRODISCECTOMY;  Surgeon: Kerrin ChampagneJames E Nitka, MD;  [facility]: [facility];  Service: Orthopedics;  Laterality: N/A;  . TONSILLECTOMY    . TOTAL HIP ARTHROPLASTY Left 09/07/2015   Procedure: LEFT TOTAL HIP ARTHROPLASTY ANTERIOR APPROACH;  Surgeon: Kathryne Hitchhristopher Y Blackman, MD;  [facility];  Service: Orthopedics;  Laterality: Left;  . TUBAL LIGATION      There were no vitals filed for this visit.      Subjective Assessment - 08/15/16 0855    Subjective "the ankle and calf has really loosened up, I don't feel the pulling any more, the foot feels sore by the end of the day"    Currently in Pain? Yes   Pain Score 0-No pain   Pain Onset More than a month ago   Aggravating Factors  walking/ standing, intermittent                          [facility] Adult PT Treatment/Exercise - 08/15/16 0001      Knee/Hip Exercises: Aerobic   Nustep L 5 x 5 min  LE only     Knee/Hip Exercises: Sidelying   Hip ABduction AROM;Strengthening;Right;2 sets;10 reps     Manual  Therapy   McConnell medial arch taping trial      Ankle Exercises: Seated   Other Seated Ankle Exercises toe yoga 2 x 10  verbal/ visual cues for proper form avoiding rocking knee     Ankle Exercises: Stretches   Gastroc Stretch 30 seconds;2 reps  off edge of step   Other Stretch posterior tib stretch 2 x 30 sec hold  off edge off step                PT Education - 08/15/16 1034    Education provided Yes   Education Details updated HEP with proper form and treatment rationale   Person(s) Educated Patient   Methods Explanation;Verbal cues   Comprehension Verbal cues required          PT Short Term Goals - 07/08/16 1113      PT SHORT TERM GOAL #1   Title independent with intial HEP   Time 1   Period Weeks   Status Achieved           PT Long Term Goals - 07/31/16 1614      PT LONG TERM GOAL #3   Title walk without difficulty   Status On-going  Plan - 08/15/16 1059    Clinical Impression Statement Mrs. Kraker continues to progress with decrease tightness and report of improved mobility. further assessment pt demonstrated limited strength in the R hip abductors. opted to avoid DN  today to assesspt progress next visit. pt was able to do all exercises and stretching with no report of pain.    PT Next Visit Plan arch taping, PNF stretching, active release techniques, posterior tib STW, Modalities PRN for tightness/ pain, stair training, stretching on step. hip strengtheing   PT Home Exercise Plan toe yoga, sidelying hip abduction   Consulted and Agree with Plan of Care Patient      Patient will benefit from skilled therapeutic intervention in order to improve the following deficits and impairments:  Abnormal gait, Decreased balance, Decreased mobility, Decreased range of motion, Decreased strength, Difficulty walking, Impaired flexibility, Pain  Visit Diagnosis: Pain in right ankle and joints of right foot  Difficulty in walking, not  elsewhere classified  Pain in right foot     Problem List Patient Active Problem List   Diagnosis Date Noted  . Osteoarthritis of left hip 09/07/2015  . Status post total replacement of left hip 09/07/2015  . Lumbar disc herniation with radiculopathy 05/29/2015    Class: Acute  . Spondylolisthesis of lumbar region 05/29/2015    Class: Chronic   Lulu Riding PT, DPT, LAT, ATC  08/15/16  12:22 PM      [facility] [address] [place], [place], [address] Phone: [phone]   Fax:  [phone]  Name: Bianca Ware MRN: [id number] Date of Birth: 06-07-1946

## 2016-08-18 ENCOUNTER — Ambulatory Visit: Payer: Medicare Other | Admitting: Physical Therapy

## 2016-08-18 DIAGNOSIS — M25571 Pain in right ankle and joints of right foot: Secondary | ICD-10-CM | POA: Diagnosis not present

## 2016-08-18 DIAGNOSIS — M79671 Pain in right foot: Secondary | ICD-10-CM

## 2016-08-18 DIAGNOSIS — R262 Difficulty in walking, not elsewhere classified: Secondary | ICD-10-CM

## 2016-08-18 NOTE — Therapy (Signed)
[facility] Outpatient Rehabilitation [facility] [address] [place], [place], [address] Phone: [phone]   Fax:  [phone]  Physical Therapy Treatment  Patient Details  Name: Bianca Ware MRN: [id number] Date of Birth: [birth year] Referring Provider: Magnus Ivan  Encounter Date: 08/18/2016      PT End of Session - 08/18/16 1030    Visit Number 15   Date for PT Re-Evaluation 08/24/16   PT Start Time 0930   PT Stop Time 1012   PT Time Calculation (min) 42 min   Activity Tolerance Patient tolerated treatment well   Behavior During Therapy [facility] for tasks assessed/performed      Past Medical History:  Diagnosis Date  . Arthritis   . Complication of anesthesia    waking up  . PONV (postoperative nausea and vomiting)     Past Surgical History:  Procedure Laterality Date  . ABDOMINAL HYSTERECTOMY    . DILATION AND CURETTAGE OF UTERUS    . LUMBAR LAMINECTOMY N/A 05/29/2015   Procedure: LEFT L4-5 MICRODISCECTOMY;  Surgeon: Kerrin Champagne, MD;  Location: [facility];  Service: Orthopedics;  Laterality: N/A;  . TONSILLECTOMY    . TOTAL HIP ARTHROPLASTY Left 09/07/2015   Procedure: LEFT TOTAL HIP ARTHROPLASTY ANTERIOR APPROACH;  Surgeon: Kathryne Hitch, MD;  [facility]: [facility];  Service: Orthopedics;  Laterality: Left;  . TUBAL LIGATION      There were no vitals filed for this visit.      Subjective Assessment - 08/18/16 0931    Subjective "I am doing really well, able to walk over the weekend and at church with minor soreness but not bad"    Currently in Pain? No/denies                         [facility] Adult PT Treatment/Exercise - 08/18/16 1022      Knee/Hip Exercises: Aerobic   Recumbent Bike L 3 x 5 min     Knee/Hip Exercises: Standing   Hip Abduction AROM;Stengthening;Both;2 sets;10 reps     Manual Therapy   Soft tissue mobilization IASTM along R poximal / mid calf while sustained calf stretch, Also over tibialis posterior  with  sustained calf stretch, while performing contract/relax     Ankle Exercises: Stretches   Gastroc Stretch 30 seconds;2 reps  off edge of step   Other Stretch posterior tib stretch 2 x 30 sec hold  off edge of step     Ankle Exercises: Standing   Other Standing Ankle Exercises stair training 2 x 4 inch step, 8 x on 6 inch step (verbal cues to having R knee track over toes when descending step)  pt rotates hip on R to compensate previous lack of DF ROM          Trigger Point Dry Needling - 08/18/16 1027    Consent Given? Yes   Education Handout Provided Yes  given previously   Gastrocnemius Response Twitch response elicited;Palpable increased muscle length  lateral mid gastroc with x 1 with pistoning / twisting              PT Education - 08/18/16 1027    Education provided Yes   Education Details navigating up/ down stairs tracking knee over toes to avoid compensation. getting a in shoe orthotic due to benefiting form taping, discussed gradual progression of wearing orthotic due to it changing her gait pattern, and where she can find the orthotic   Person(s) Educated Patient  Methods Explanation;Verbal cues;Demonstration;Tactile cues   Comprehension Verbalized understanding;Returned demonstration;Verbal cues required;Tactile cues required          PT Short Term Goals - 07/08/16 1113      PT SHORT TERM GOAL #1   Title independent with intial HEP   Time 1   Period Weeks   Status Achieved           PT Long Term Goals - 07/31/16 1614      PT LONG TERM GOAL #3   Title walk without difficulty   Status On-going               Plan - 08/18/16 1032    Clinical Impression Statement pt reported no pain today. pt demonstrates compensation with descending stairs with genu valgum and trunk rotation due to previous lack of DF, pt was able to correct with verbal/ tactile cues to track knee over toes. Continues hip strengthening, and in shoe orthotics since arch  taping has helped tremendously. continued DN to decrease tightness in R lateral proximal gastroc. pt declined modalities post session.    PT Next Visit Plan ROM, GOALS, assess if pt got insert,posterior tib STW, Modalities PRN for tightness/ pain, stretching on step. hip strengtheing, step up/ downs / reverse   Consulted and Agree with Plan of Care Patient      Patient will benefit from skilled therapeutic intervention in order to improve the following deficits and impairments:  Abnormal gait, Decreased balance, Decreased mobility, Decreased range of motion, Decreased strength, Difficulty walking, Impaired flexibility, Pain  Visit Diagnosis: Pain in right ankle and joints of right foot  Difficulty in walking, not elsewhere classified  Pain in right foot     Problem List Patient Active Problem List   Diagnosis Date Noted  . Osteoarthritis of left hip 09/07/2015  . Status post total replacement of left hip 09/07/2015  . Lumbar disc herniation with radiculopathy 05/29/2015    Class: Acute  . Spondylolisthesis of lumbar region 05/29/2015    Class: Chronic   Bianca Ware PT, DPT, LAT, ATC  08/18/16  10:38 AM      [facility] [address] [place], [place], [address] Phone: [phone]   Fax:  [phone]  Name: Bianca Ware MRN: [id number] Date of Birth: 10/28/1946

## 2016-08-18 NOTE — Patient Instructions (Signed)
Orthotic for medial arch support on the R due to Bianca Ware demonstrating arch drop. Have been performing medial arch taping and she has been noticing significant relief and would benefit from an in shoe orthotic.

## 2016-08-21 ENCOUNTER — Ambulatory Visit: Payer: Medicare Other | Admitting: Physical Therapy

## 2016-08-21 DIAGNOSIS — M79671 Pain in right foot: Secondary | ICD-10-CM

## 2016-08-21 DIAGNOSIS — R262 Difficulty in walking, not elsewhere classified: Secondary | ICD-10-CM

## 2016-08-21 DIAGNOSIS — M25571 Pain in right ankle and joints of right foot: Secondary | ICD-10-CM

## 2016-08-21 NOTE — Therapy (Signed)
[facility] [address] [place], [place], [address] Phone: [phone]   Fax:  [phone]  Physical Therapy Treatment  Patient Details  Name: Bianca Ware MRN: [id number] Date of Birth: 06/16/1946 Referring Provider: Magnus IvanBlackman  Encounter Date: 08/21/2016      PT End of Session - 08/21/16 1149    Visit Number 16   Date for PT Re-Evaluation 08/24/16   PT Start Time 1100   PT Stop Time 1143   PT Time Calculation (min) 43 min   Activity Tolerance Patient tolerated treatment well   Behavior During Therapy [facility] for tasks assessed/performed      Past Medical History:  Diagnosis Date  . Arthritis   . Complication of anesthesia    waking up  . PONV (postoperative nausea and vomiting)     Past Surgical History:  Procedure Laterality Date  . ABDOMINAL HYSTERECTOMY    . DILATION AND CURETTAGE OF UTERUS    . LUMBAR LAMINECTOMY N/A 05/29/2015   Procedure: LEFT L4-5 MICRODISCECTOMY;  Surgeon: Kerrin ChampagneJames E Nitka, MD;  Location: [facility];  Service: Orthopedics;  Laterality: N/A;  . TONSILLECTOMY    . TOTAL HIP ARTHROPLASTY Left 09/07/2015   Procedure: LEFT TOTAL HIP ARTHROPLASTY ANTERIOR APPROACH;  Surgeon: Kathryne Hitchhristopher Y Blackman, MD;  [facility];  Service: Orthopedics;  Laterality: Left;  . TUBAL LIGATION      There were no vitals filed for this visit.      Subjective Assessment - 08/21/16 1106    Subjective "I did get the othortics have been gradually working in them"    Currently in Pain? No/denies                         [facility] Treatment/Exercise - 08/21/16 0001      Knee/Hip Exercises: Aerobic   Recumbent Bike L 3 x 6 min     Knee/Hip Exercises: Standing   Hip Abduction AROM;Stengthening;Both;2 sets;10 reps     Knee/Hip Exercises: Seated   Sit to Sand 10 reps;1 set  band around the knees, blue theraband     Knee/Hip Exercises: Supine   Bridges with Clamshell AROM;Both;2 sets;10 reps      Ankle Exercises: Stretches   Gastroc Stretch 30 seconds;2 reps   Other Stretch posterior tib stretch 2 x 30 sec hold     Ankle Exercises: Standing   Other Standing Ankle Exercises stair training 2 x 4 inch step, 8 x on 6 inch step (verbal cues to having R knee track over toes when descending step)  able to descend/ ascend without difficulty following trainin             Balance Exercises - 08/21/16 1137      Balance Exercises: Standing   Standing Eyes Opened 2 reps;30 secs;Solid surface   Tandem Stance 4 reps;30 secs  moderate postural sway           PT Education - 08/21/16 1148    Education provided Yes   Education Details continue to progress with increaseing orthotic wear time, and benefits of balance training to work on strengthening in the hips and ankles as well as promoet safety with standing.    Person(s) Educated Patient   Methods Explanation;Verbal cues;Handout   Comprehension Verbalized understanding;Verbal cues required          PT Short Term Goals - 07/08/16 1113      PT SHORT TERM GOAL #1   Title independent with  intial HEP   Time 1   Period Weeks   Status Achieved           PT Long Term Goals - 07/31/16 1614      PT LONG TERM GOAL #3   Title walk without difficulty   Status On-going               Plan - 08/21/16 1149    Clinical Impression Statement Mrs. sendejo reports getting the orthotics at [employer] and has gradually increased wearing them reporting feeling weird but no pain. focused on functional exercisees and balance training which she reported soreness but no pain. plan to reassess pt next visit to determine if further treatment is necessary.    PT Next Visit Plan ROM, GOALS, assess if pt got insert,posterior tib STW, Modalities PRN for tightness/ pain, stretching on step. hip strengtheing, step up/ downs / reverse ERO   Consulted and Agree with Plan of Care Patient      Patient will benefit from skilled therapeutic  intervention in order to improve the following deficits and impairments:  Abnormal gait, Decreased balance, Decreased mobility, Decreased range of motion, Decreased strength, Difficulty walking, Impaired flexibility, Pain  Visit Diagnosis: Pain in right ankle and joints of right foot  Difficulty in walking, not elsewhere classified  Pain in right foot     Problem List Patient Active Problem List   Diagnosis Date Noted  . Osteoarthritis of left hip 09/07/2015  . Status post total replacement of left hip 09/07/2015  . Lumbar disc herniation with radiculopathy 05/29/2015    Class: Acute  . Spondylolisthesis of lumbar region 05/29/2015    Class: Chronic   Lulu Riding PT, DPT, LAT, ATC  08/21/16  11:52 AM      [facility] [address] [place], [place], [address] Phone: [phone]   Fax:  [phone]  Name: Bianca Ware MRN: [id number] Date of Birth: 18-May-1946

## 2016-09-01 ENCOUNTER — Ambulatory Visit: Payer: Medicare Other | Attending: Orthopaedic Surgery | Admitting: Physical Therapy

## 2016-09-01 DIAGNOSIS — M25571 Pain in right ankle and joints of right foot: Secondary | ICD-10-CM | POA: Diagnosis present

## 2016-09-01 DIAGNOSIS — M79671 Pain in right foot: Secondary | ICD-10-CM | POA: Diagnosis present

## 2016-09-01 DIAGNOSIS — R262 Difficulty in walking, not elsewhere classified: Secondary | ICD-10-CM | POA: Diagnosis present

## 2016-09-01 NOTE — Therapy (Signed)
Bianca Ware, [place], [address] Phone: [phone]   Fax:  [phone]  Physical Therapy Treatment / Discharge note  Patient Details  Name: Bianca Ware MRN: [id number] Date of Birth: [birth year] Referring Provider: Ninfa Ware  Encounter Date: 09/01/2016      PT End of Session - 09/01/16 1027    Visit Number 17   Date for PT Re-Evaluation 09/02/16   PT Start Time 0934   PT Stop Time 1016   PT Time Calculation (min) 42 min   Activity Tolerance Patient tolerated treatment well   Behavior During Therapy [facility] for tasks assessed/performed      Past Medical History:  Diagnosis Date  . Arthritis   . Complication of anesthesia    waking up  . PONV (postoperative nausea and vomiting)     Past Surgical History:  Procedure Laterality Date  . ABDOMINAL HYSTERECTOMY    . DILATION AND CURETTAGE OF UTERUS    . LUMBAR LAMINECTOMY N/A 05/29/2015   Procedure: LEFT L4-5 MICRODISCECTOMY;  Surgeon: Bianca Oto, MD;  Location: [place];  Service: Orthopedics;  Laterality: N/A;  . TONSILLECTOMY    . TOTAL HIP ARTHROPLASTY Left 09/07/2015   Procedure: LEFT TOTAL HIP ARTHROPLASTY ANTERIOR APPROACH;  Surgeon: Bianca Rossetti, MD;  [facility];  Service: Orthopedics;  Laterality: Left;  . TUBAL LIGATION      There were no vitals filed for this visit.      Subjective Assessment - 09/01/16 0934    Subjective "The vacation was good, did alot of walking, and alot of stairs which was a challenge, and been having more pain inthe hamstring"   Currently in Pain? No/denies            [facility] PT Assessment - 09/01/16 0001      ROM / Strength   AROM / PROM / Strength Strength     AROM   Right Ankle Dorsiflexion 5   Right Ankle Plantar Flexion 60   Right Ankle Inversion 28   Right Ankle Eversion 14     Strength   Strength Assessment Site Ankle   Right/Left Ankle Right   Right Ankle Dorsiflexion 4+/5    Right Ankle Plantar Flexion 4+/5   Right Ankle Inversion 4/5   Right Ankle Eversion 4/5                     [facility] Adult PT Treatment/Exercise - 09/01/16 0001      Knee/Hip Exercises: Stretches   Active Hamstring Stretch 2 reps;30 seconds   Other Knee/Hip Stretches 2 x 30 sec  single knee to chest   Other Knee/Hip Stretches piriformis stretch 2 x 30 sec hold     Knee/Hip Exercises: Aerobic   Nustep L 5 x 5 min     Ankle Exercises: Stretches   Gastroc Stretch 30 seconds;2 reps   Other Stretch posterior tib stretch 2 x 30 sec hold                PT Education - 09/01/16 1025    Education provided Yes   Education Details reviewed and updated/ progressed exercises and stretches, for the hip and explained that she is using muscles more efficiently and due to walking more / using stairs more she is more sore.    Person(s) Educated Patient   Methods Explanation;Verbal cues   Comprehension Verbalized understanding;Verbal cues required          PT Short Term  Goals - 07/08/16 1113      PT SHORT TERM GOAL #1   Title independent with intial HEP   Time 1   Period Weeks   Status Achieved           PT Long Term Goals - September 28, 2016 1009      PT LONG TERM GOAL #1   Title decrease pain 50%   Baseline 90%   Time 6   Period Weeks   Status Achieved     PT LONG TERM GOAL #2   Title increase DF to 10 degrees   Time 6   Period Weeks   Status Achieved     PT LONG TERM GOAL #3   Title walk without difficulty   Time 6   Period Weeks   Status Achieved     PT LONG TERM GOAL #4   Title increase inversion and eversion strength to 4/5   Period Weeks   Status Achieved     PT LONG TERM GOAL #5   Title understand RICE   Time 6   Period Weeks   Status Achieved               Plan - September 28, 2016 1029    Clinical Impression Statement Mrs. Bianca Ware returned from her vacation with soreness inher hip, but no pain or soreness in the ankle, she has improved her  ankle mobility and strength in all planes. following exercises and stretching she reported no pain in the hip. She met all goals and reports thats she is able to maintain and progress her function independenlty and will be discharged from PT today.    PT Next Visit Plan Discharged from PT   PT Home Exercise Plan single knee to chest, corner balance, hip flexor stretch   Consulted and Agree with Plan of Care Patient      Patient will benefit from skilled therapeutic intervention in order to improve the following deficits and impairments:  Abnormal gait, Decreased balance, Decreased mobility, Decreased range of motion, Decreased strength, Difficulty walking, Impaired flexibility, Pain  Visit Diagnosis: Pain in right ankle and joints of right foot  Difficulty in walking, not elsewhere classified  Pain in right foot       G-Codes - 09-28-2016 1041    Functional Assessment Tool Used clinical judgement   Functional Limitation Mobility: Walking and moving around   Mobility: Walking and Moving Around Goal Status [phone]) At least 40 percent but less than 60 percent impaired, limited or restricted   Mobility: Walking and Moving Around Discharge Status [phone]) At least 20 percent but less than 40 percent impaired, limited or restricted      Problem List Patient Active Problem List   Diagnosis Date Noted  . Osteoarthritis of left hip 09/07/2015  . Status post total replacement of left hip 09/07/2015  . Lumbar disc herniation with radiculopathy 05/29/2015    Class: Acute  . Spondylolisthesis of lumbar region 05/29/2015    Class: Chronic   Bianca Ware PT, DPT, LAT, ATC  09-28-16  10:43 AM      [facility] [place], [place], [address] Phone: [phone]   Fax:  [phone]  Name: Bianca Ware MRN: [id number] Date of Birth: June 24, 1946   PHYSICAL THERAPY DISCHARGE SUMMARY  Visits from Start of Care:  17  Current functional level related to goals / functional outcomes: See goals   Remaining deficits: Intermittent soreness noted in the R hip/ quad, mild soreness in  the lateral heel.    Education / Equipment: HEP, theraband, in-shoe orthotic, posture, mechanics with stairs  Plan: Patient agrees to discharge.  Patient goals were met. Patient is being discharged due to meeting the stated rehab goals.  ?????

## 2016-09-05 ENCOUNTER — Encounter: Payer: Medicare Other | Admitting: Physical Therapy

## 2016-09-08 ENCOUNTER — Ambulatory Visit (INDEPENDENT_AMBULATORY_CARE_PROVIDER_SITE_OTHER): Payer: Medicare Other | Admitting: Physician Assistant

## 2016-09-08 DIAGNOSIS — M1612 Unilateral primary osteoarthritis, left hip: Secondary | ICD-10-CM

## 2016-09-08 DIAGNOSIS — M7661 Achilles tendinitis, right leg: Secondary | ICD-10-CM

## 2016-09-09 ENCOUNTER — Encounter: Payer: Medicare Other | Admitting: Physical Therapy

## 2016-09-11 ENCOUNTER — Encounter: Payer: Medicare Other | Admitting: Physical Therapy

## 2016-11-24 HISTORY — PX: CATARACT EXTRACTION W/ INTRAOCULAR LENS IMPLANT: SHX1309

## 2017-01-06 ENCOUNTER — Other Ambulatory Visit: Payer: Self-pay | Admitting: Internal Medicine

## 2017-01-06 DIAGNOSIS — Z1231 Encounter for screening mammogram for malignant neoplasm of breast: Secondary | ICD-10-CM

## 2017-02-13 ENCOUNTER — Ambulatory Visit
Admission: RE | Admit: 2017-02-13 | Discharge: 2017-02-13 | Disposition: A | Discharge status: Home / Self Care | Payer: BLUE CROSS/BLUE SHIELD | Source: Ambulatory Visit | Attending: Internal Medicine | Admitting: Internal Medicine

## 2017-02-13 DIAGNOSIS — Z1231 Encounter for screening mammogram for malignant neoplasm of breast: Secondary | ICD-10-CM | POA: Diagnosis not present

## 2017-08-05 DIAGNOSIS — Z0000 Encounter for general adult medical examination without abnormal findings: Secondary | ICD-10-CM | POA: Diagnosis not present

## 2017-08-05 DIAGNOSIS — E784 Other hyperlipidemia: Secondary | ICD-10-CM | POA: Diagnosis not present

## 2017-08-12 DIAGNOSIS — M1990 Unspecified osteoarthritis, unspecified site: Secondary | ICD-10-CM | POA: Diagnosis not present

## 2017-08-12 DIAGNOSIS — Z6828 Body mass index (BMI) 28.0-28.9, adult: Secondary | ICD-10-CM | POA: Diagnosis not present

## 2017-08-12 DIAGNOSIS — Z0000 Encounter for general adult medical examination without abnormal findings: Secondary | ICD-10-CM | POA: Diagnosis not present

## 2017-08-12 DIAGNOSIS — N952 Postmenopausal atrophic vaginitis: Secondary | ICD-10-CM | POA: Diagnosis not present

## 2017-08-12 DIAGNOSIS — I8390 Asymptomatic varicose veins of unspecified lower extremity: Secondary | ICD-10-CM | POA: Diagnosis not present

## 2017-08-12 DIAGNOSIS — Z23 Encounter for immunization: Secondary | ICD-10-CM | POA: Diagnosis not present

## 2017-08-12 DIAGNOSIS — J309 Allergic rhinitis, unspecified: Secondary | ICD-10-CM | POA: Diagnosis not present

## 2017-08-12 DIAGNOSIS — E785 Hyperlipidemia, unspecified: Secondary | ICD-10-CM | POA: Diagnosis not present

## 2017-08-12 DIAGNOSIS — M5416 Radiculopathy, lumbar region: Secondary | ICD-10-CM | POA: Diagnosis not present

## 2017-08-12 DIAGNOSIS — N951 Menopausal and female climacteric states: Secondary | ICD-10-CM | POA: Diagnosis not present

## 2017-08-12 DIAGNOSIS — K589 Irritable bowel syndrome without diarrhea: Secondary | ICD-10-CM | POA: Diagnosis not present

## 2017-08-12 DIAGNOSIS — Z1389 Encounter for screening for other disorder: Secondary | ICD-10-CM | POA: Diagnosis not present

## 2017-08-14 DIAGNOSIS — Z1212 Encounter for screening for malignant neoplasm of rectum: Secondary | ICD-10-CM | POA: Diagnosis not present

## 2017-08-25 DIAGNOSIS — H25813 Combined forms of age-related cataract, bilateral: Secondary | ICD-10-CM | POA: Diagnosis not present

## 2017-10-08 DIAGNOSIS — H268 Other specified cataract: Secondary | ICD-10-CM | POA: Diagnosis not present

## 2017-10-08 DIAGNOSIS — H2512 Age-related nuclear cataract, left eye: Secondary | ICD-10-CM | POA: Diagnosis not present

## 2017-11-23 ENCOUNTER — Ambulatory Visit (INDEPENDENT_AMBULATORY_CARE_PROVIDER_SITE_OTHER): Payer: Medicare Other | Admitting: Orthopaedic Surgery

## 2017-11-23 ENCOUNTER — Encounter (INDEPENDENT_AMBULATORY_CARE_PROVIDER_SITE_OTHER): Payer: Self-pay | Admitting: Orthopaedic Surgery

## 2017-11-23 ENCOUNTER — Ambulatory Visit (INDEPENDENT_AMBULATORY_CARE_PROVIDER_SITE_OTHER): Payer: Medicare Other

## 2017-11-23 DIAGNOSIS — M25562 Pain in left knee: Secondary | ICD-10-CM

## 2017-11-23 MED ORDER — LIDOCAINE HCL 1 % IJ SOLN
3.0000 mL | INTRAMUSCULAR | Status: AC | PRN
  Administered 2017-11-23: 3 mL

## 2017-11-23 MED ORDER — METHYLPREDNISOLONE ACETATE 40 MG/ML IJ SUSP
40.0000 mg | INTRAMUSCULAR | Status: AC | PRN
  Administered 2017-11-23: 40 mg via INTRA_ARTICULAR

## 2017-11-23 NOTE — Progress Notes (Signed)
Office Visit Note   Patient: Bianca Ware           Date of Birth: 06/23/1946           MRN: [id number] Visit Date: 11/23/2017              Requested by: Chilton GreathouseAvva, Ravisankar, MD [address] [place], [place] [address] PCP: Chilton GreathouseAvva, Ravisankar, MD   Assessment & Plan: Visit Diagnoses:  1. Acute pain of left knee     Plan: Given the plain film findings are going to try conservative treatment for now.  I recommended a steroid injection to try in her knee and then a month from now trying hyaluronic acid.  I explained the rationale behind this as well as the risk and benefits and she agrees with this treatment plan.  She tolerated steroid injection her left knee well.  In 4 weeks we will place a hyaluronic acid injection in her left knee.  All questions were encouraged and answered.  If she continues to have the recurrent effusions after this an MRI would be warranted of her left knee to assess the cartilage.  Follow-Up Instructions: Return in about 4 weeks (around 12/21/2017).   Orders:  Orders Placed This Encounter  Procedures  . Large Joint Inj  . XR Knee 1-2 Views Left   No orders of the defined types were placed in this encounter.     Procedures: Large Joint Inj: L knee on 11/23/2017 2:59 PM Indications: diagnostic evaluation and pain Details: 22 G 1.5 in needle, superolateral approach  Arthrogram: No  Medications: 3 mL lidocaine 1 %; 40 mg methylPREDNISolone acetate 40 MG/ML Outcome: tolerated well, no immediate complications Procedure, treatment alternatives, risks and benefits explained, specific risks discussed. Consent was given by the patient. Immediately prior to procedure a time out was called to verify the correct patient, procedure, equipment, support staff and site/side marked as required. Patient was prepped and draped in the usual sterile fashion.       Clinical Data: No additional findings.   Subjective: Chief Complaint  Patient presents with  . Left  Knee - Pain, Follow-up   The patient comes in today with left knee pain.  This is been new to her and has been going on for a few months now.  She denies any injuries or locking catching but it does swell quite a bit on occasion call swelling down her knee.  It does hurt quite a bit in the back of her knee as well. HPI  Review of Systems She currently denies any headache, chest pain, shortness of breath, fever, chills, nausea, vomiting.  Objective: Vital Signs: There were no vitals taken for this visit.  Physical Exam She is alert and oriented x3 and in no acute distress Ortho Exam Examination of her left knee does show patellofemoral crepitation but neutral alignment.  She has good range of motion overall with the knee.  There is a slight effusion.  The knee feels like mostly stable. Specialty Comments:  No specialty comments available.  Imaging: Xr Knee 1-2 Views Left  Result Date: 11/23/2017 An AP and lateral left knee shows well-maintained joint space which is some slight patellofemoral chondromalacia.    PMFS History: Patient Active Problem List   Diagnosis Date Noted  . Osteoarthritis of left hip 09/07/2015  . Status post total replacement of left hip 09/07/2015  . Lumbar disc herniation with radiculopathy 05/29/2015    Class: Acute  . Spondylolisthesis of lumbar region 05/29/2015  Class: Chronic   Past Medical History:  Diagnosis Date  . Arthritis   . Complication of anesthesia    waking up  . PONV (postoperative nausea and vomiting)     History reviewed. No pertinent family history.  Past Surgical History:  Procedure Laterality Date  . ABDOMINAL HYSTERECTOMY    . DILATION AND CURETTAGE OF UTERUS    . LUMBAR LAMINECTOMY N/A 05/29/2015   Procedure: LEFT L4-5 MICRODISCECTOMY;  Surgeon: Kerrin ChampagneJames E Nitka, MD;  Location: [facility];  Service: Orthopedics;  Laterality: N/A;  . TONSILLECTOMY    . TOTAL HIP ARTHROPLASTY Left 09/07/2015   Procedure: LEFT TOTAL HIP  ARTHROPLASTY ANTERIOR APPROACH;  Surgeon: Kathryne Hitchhristopher Y Deannah Rossi, MD;  Location: [facility];  Service: Orthopedics;  Laterality: Left;  . TUBAL LIGATION     Social History   Occupational History  . Not on file  Tobacco Use  . Smoking status: Former Smoker    Packs/day: 0.25    Years: 15.00    Pack years: 3.75    Types: Cigarettes    Last attempt to quit: 05/24/1977    Years since quitting: 40.5  . Smokeless tobacco: Never Used  Substance and Sexual Activity  . Alcohol use: No  . Drug use: No  . Sexual activity: Not on file

## 2017-12-21 ENCOUNTER — Ambulatory Visit (INDEPENDENT_AMBULATORY_CARE_PROVIDER_SITE_OTHER): Payer: Medicare Other | Admitting: Orthopaedic Surgery

## 2017-12-21 ENCOUNTER — Encounter (INDEPENDENT_AMBULATORY_CARE_PROVIDER_SITE_OTHER): Payer: Self-pay | Admitting: Orthopaedic Surgery

## 2017-12-21 DIAGNOSIS — M25562 Pain in left knee: Principal | ICD-10-CM

## 2017-12-21 DIAGNOSIS — G8929 Other chronic pain: Secondary | ICD-10-CM

## 2017-12-21 DIAGNOSIS — M1712 Unilateral primary osteoarthritis, left knee: Secondary | ICD-10-CM | POA: Diagnosis not present

## 2017-12-21 MED ORDER — HYLAN G-F 20 48 MG/6ML IX SOSY
48.0000 mg | PREFILLED_SYRINGE | INTRA_ARTICULAR | Status: AC | PRN
  Administered 2017-12-21: 48 mg via INTRA_ARTICULAR

## 2017-12-21 NOTE — Progress Notes (Signed)
   Procedure Note  Patient: Bianca Ware             Date of Birth: 10/04/1946           MRN: [id number]             Visit Date: 12/21/2017  Procedures: Visit Diagnoses: Chronic pain of left knee  Large Joint Inj: L knee on 12/21/2017 3:30 PM Indications: pain and diagnostic evaluation Details: 22 G 1.5 in needle, superolateral approach  Arthrogram: No  Medications: 48 mg Hylan 48 MG/6ML Outcome: tolerated well, no immediate complications Procedure, treatment alternatives, risks and benefits explained, specific risks discussed. Consent was given by the patient. Immediately prior to procedure a time out was called to verify the correct patient, procedure, equipment, support staff and site/side marked as required. Patient was prepped and draped in the usual sterile fashion.     The patient is coming in today as follow-up after steroid injection in her left knee as well as to consider hyaluronic acid which is ordered for that knee with Synvisc 1.  She has been having some recurrent effusions but her x-rays only showed some mild patellofemoral arthritic changes with well-maintained joint space.  She said the steroid injections help calm things down a lot.  She still having some problems with going up and down stairs.  She is on a treadmill a lot as well.  On exam there is no effusion today in her knee joint is nice and cool.  He does have some patellofemoral crepitation but only slight pain.  She is a perfect candidate for Synvisc 1.  We placed this in her knee without difficulty.  All questions concerns were answered and addressed.  She will follow-up as needed.  If she continues to have knee problems that she is someone that I would MRI her knee because of the well-maintained joint space.

## 2018-01-06 ENCOUNTER — Other Ambulatory Visit: Payer: Self-pay | Admitting: Internal Medicine

## 2018-01-06 DIAGNOSIS — Z139 Encounter for screening, unspecified: Secondary | ICD-10-CM

## 2018-02-15 ENCOUNTER — Ambulatory Visit
Admission: RE | Admit: 2018-02-15 | Discharge: 2018-02-15 | Disposition: A | Discharge status: Home / Self Care | Payer: Medicare Other | Source: Ambulatory Visit | Attending: Internal Medicine | Admitting: Internal Medicine

## 2018-02-15 DIAGNOSIS — Z139 Encounter for screening, unspecified: Secondary | ICD-10-CM

## 2018-02-15 DIAGNOSIS — Z1231 Encounter for screening mammogram for malignant neoplasm of breast: Secondary | ICD-10-CM | POA: Diagnosis not present

## 2018-02-22 ENCOUNTER — Encounter (INDEPENDENT_AMBULATORY_CARE_PROVIDER_SITE_OTHER): Payer: Self-pay | Admitting: Orthopaedic Surgery

## 2018-02-22 ENCOUNTER — Ambulatory Visit (INDEPENDENT_AMBULATORY_CARE_PROVIDER_SITE_OTHER): Payer: Medicare Other | Admitting: Orthopaedic Surgery

## 2018-02-22 ENCOUNTER — Other Ambulatory Visit (INDEPENDENT_AMBULATORY_CARE_PROVIDER_SITE_OTHER): Payer: Self-pay

## 2018-02-22 DIAGNOSIS — M1711 Unilateral primary osteoarthritis, right knee: Secondary | ICD-10-CM | POA: Diagnosis not present

## 2018-02-22 DIAGNOSIS — G8929 Other chronic pain: Secondary | ICD-10-CM | POA: Diagnosis not present

## 2018-02-22 DIAGNOSIS — M25561 Pain in right knee: Secondary | ICD-10-CM

## 2018-02-22 MED ORDER — METHYLPREDNISOLONE ACETATE 40 MG/ML IJ SUSP
40.0000 mg | INTRAMUSCULAR | Status: AC | PRN
  Administered 2018-02-22: 40 mg via INTRA_ARTICULAR

## 2018-02-22 MED ORDER — LIDOCAINE HCL 1 % IJ SOLN
3.0000 mL | INTRAMUSCULAR | Status: AC | PRN
  Administered 2018-02-22: 3 mL

## 2018-02-22 NOTE — Progress Notes (Signed)
   Procedure Note  Patient: Bianca Ware             Date of Birth: 10/10/1946           MRN: [id number]             Visit Date: 02/22/2018  Procedures: Visit Diagnoses: Chronic pain of right knee  Unilateral primary osteoarthritis, right knee  Large Joint Inj: R knee on 02/22/2018 3:49 PM Indications: diagnostic evaluation and pain Details: 22 G 1.5 in needle, superolateral approach  Arthrogram: No  Medications: 3 mL lidocaine 1 %; 40 mg methylPREDNISolone acetate 40 MG/ML Outcome: tolerated well, no immediate complications Procedure, treatment alternatives, risks and benefits explained, specific risks discussed. Consent was given by the patient. Immediately prior to procedure a time out was called to verify the correct patient, procedure, equipment, support staff and site/side marked as required. Patient was prepped and draped in the usual sterile fashion.    The patient has known osteoarthritis in general disease of her right knee.  Is mainly involving the medial joint line and x-rays confirm this.  She has done well with hyaluronic acid and steroids on her left knee.  She like to try this regimen on the right side.  On exam she does have medial joint line tenderness in the right knee with good range of motion overall.  X-rays do show medial joint space narrowing of the right knee.  She tolerated steroid injection well in her right knee.  In a month from now we will place a hyaluronic acid to treat the moderate arthritis pain in her right knee.  All questions concerns were answered and addressed.

## 2018-02-24 ENCOUNTER — Telehealth (INDEPENDENT_AMBULATORY_CARE_PROVIDER_SITE_OTHER): Payer: Self-pay

## 2018-02-24 NOTE — Telephone Encounter (Signed)
Submitted application online for Monovisc injection, right knee. 

## 2018-03-03 DIAGNOSIS — E7849 Other hyperlipidemia: Secondary | ICD-10-CM | POA: Diagnosis not present

## 2018-03-24 ENCOUNTER — Ambulatory Visit (INDEPENDENT_AMBULATORY_CARE_PROVIDER_SITE_OTHER): Payer: Medicare Other | Admitting: Orthopaedic Surgery

## 2018-03-24 ENCOUNTER — Encounter (INDEPENDENT_AMBULATORY_CARE_PROVIDER_SITE_OTHER): Payer: Self-pay | Admitting: Orthopaedic Surgery

## 2018-03-24 DIAGNOSIS — M1711 Unilateral primary osteoarthritis, right knee: Secondary | ICD-10-CM | POA: Diagnosis not present

## 2018-03-24 MED ORDER — HYLAN G-F 20 48 MG/6ML IX SOSY
48.0000 mg | PREFILLED_SYRINGE | INTRA_ARTICULAR | Status: AC | PRN
  Administered 2018-03-24: 48 mg via INTRA_ARTICULAR

## 2018-03-24 NOTE — Progress Notes (Signed)
   Procedure Note  Patient: Bianca Ware             Date of Birth: 1946/05/27           MRN: [id number]             Visit Date: 03/24/2018  Procedures: Visit Diagnoses: Unilateral primary osteoarthritis, right knee  Large Joint Inj: R knee on 03/24/2018 4:00 PM Indications: pain and diagnostic evaluation Details: 22 G 1.5 in needle, superolateral approach  Arthrogram: No  Medications: 48 mg Hylan 48 MG/6ML Outcome: tolerated well, no immediate complications Procedure, treatment alternatives, risks and benefits explained, specific risks discussed. Consent was given by the patient. Immediately prior to procedure a time out was called to verify the correct patient, procedure, equipment, support staff and site/side marked as required. Patient was prepped and draped in the usual sterile fashion.    The patient has known significant arthritis in her right knee.  She is here today for scheduled hyaluronic acid injection in that knee.  She is had a steroid injection before as well.  Is mainly medial joint line tenderness and medial joint space narrowing.  There is some patellofemoral joint disease as well.  On examination today there is no pain with her right knee.  She has pain with range of motion and joint palpation.  She tolerated the Synvisc 1 injection well.  All questions concerns were answered and addressed.  She understands she can always have steroid injection in 3 to 4 months from now and another hyaluronic acid injection in 6 months.  If these things fail the only other options consider knee replacement surgery.

## 2018-04-05 ENCOUNTER — Encounter (INDEPENDENT_AMBULATORY_CARE_PROVIDER_SITE_OTHER): Payer: Self-pay

## 2018-04-05 ENCOUNTER — Telehealth (INDEPENDENT_AMBULATORY_CARE_PROVIDER_SITE_OTHER): Payer: Self-pay | Admitting: Orthopaedic Surgery

## 2018-04-05 NOTE — Telephone Encounter (Signed)
I guess let us do a note stating that we are treating severe arthritis in her knee and we need to have her excuse from jury duty due to the severity of her pain and her inability to ambulate for long distances or sit for any significant amount of time given her knee discomfort.

## 2018-04-05 NOTE — Telephone Encounter (Signed)
Told patient note would be ready for pick up tomorrow, she said her husband will come by.

## 2018-04-05 NOTE — Telephone Encounter (Signed)
Patient aware note at front desk  

## 2018-04-05 NOTE — Telephone Encounter (Signed)
Pt will need Dr.Blackman to write a letter for jury duty. Pt is currently unable to walk and sit for an extended amount of time.

## 2018-04-05 NOTE — Telephone Encounter (Signed)
See below

## 2018-05-04 DIAGNOSIS — E7849 Other hyperlipidemia: Secondary | ICD-10-CM | POA: Diagnosis not present

## 2018-05-10 DIAGNOSIS — Z961 Presence of intraocular lens: Secondary | ICD-10-CM | POA: Diagnosis not present

## 2018-05-10 DIAGNOSIS — H25811 Combined forms of age-related cataract, right eye: Secondary | ICD-10-CM | POA: Diagnosis not present

## 2018-07-13 DIAGNOSIS — E7849 Other hyperlipidemia: Secondary | ICD-10-CM | POA: Diagnosis not present

## 2018-07-22 ENCOUNTER — Telehealth (INDEPENDENT_AMBULATORY_CARE_PROVIDER_SITE_OTHER): Payer: Self-pay | Admitting: Orthopaedic Surgery

## 2018-07-22 NOTE — Telephone Encounter (Signed)
Patient called asked if she can get a gel injection in her left knee. Patient said it's not time to get the gel injection in her right knee. Patient is scheduled 08/11/18 at 1:45pm for bil knee injections. The number to contact patient is [phone]

## 2018-07-22 NOTE — Telephone Encounter (Signed)
Can we order a gel injection for her left knee please  

## 2018-07-28 NOTE — Telephone Encounter (Signed)
Noted  

## 2018-07-30 ENCOUNTER — Telehealth (INDEPENDENT_AMBULATORY_CARE_PROVIDER_SITE_OTHER): Payer: Self-pay

## 2018-07-30 NOTE — Telephone Encounter (Signed)
Submitted VOB for Monovisc, left knee. 

## 2018-08-09 ENCOUNTER — Telehealth (INDEPENDENT_AMBULATORY_CARE_PROVIDER_SITE_OTHER): Payer: Self-pay

## 2018-08-09 NOTE — Telephone Encounter (Signed)
Patient is approved for Monovisc, left knee. Buy & Bill Patient may be responsible for 20% OOP, per Toniann FailWendy May No Co-pay required No PA required  Appt.scheduled for 08/11/2018

## 2018-08-11 ENCOUNTER — Encounter (INDEPENDENT_AMBULATORY_CARE_PROVIDER_SITE_OTHER): Payer: Self-pay | Admitting: Orthopaedic Surgery

## 2018-08-11 ENCOUNTER — Ambulatory Visit (INDEPENDENT_AMBULATORY_CARE_PROVIDER_SITE_OTHER): Payer: Medicare Other | Admitting: Orthopaedic Surgery

## 2018-08-11 DIAGNOSIS — M1711 Unilateral primary osteoarthritis, right knee: Secondary | ICD-10-CM

## 2018-08-11 DIAGNOSIS — G8929 Other chronic pain: Secondary | ICD-10-CM

## 2018-08-11 DIAGNOSIS — M25562 Pain in left knee: Secondary | ICD-10-CM | POA: Diagnosis not present

## 2018-08-11 DIAGNOSIS — M25561 Pain in right knee: Secondary | ICD-10-CM

## 2018-08-11 DIAGNOSIS — M1712 Unilateral primary osteoarthritis, left knee: Secondary | ICD-10-CM | POA: Diagnosis not present

## 2018-08-11 MED ORDER — LIDOCAINE HCL 1 % IJ SOLN
3.0000 mL | INTRAMUSCULAR | Status: AC | PRN
  Administered 2018-08-11: 3 mL

## 2018-08-11 MED ORDER — METHYLPREDNISOLONE ACETATE 40 MG/ML IJ SUSP
40.0000 mg | INTRAMUSCULAR | Status: AC | PRN
  Administered 2018-08-11: 40 mg via INTRA_ARTICULAR

## 2018-08-11 MED ORDER — HYALURONAN 88 MG/4ML IX SOSY
88.0000 mg | PREFILLED_SYRINGE | INTRA_ARTICULAR | Status: AC | PRN
  Administered 2018-08-11: 88 mg via INTRA_ARTICULAR

## 2018-08-11 NOTE — Progress Notes (Signed)
   Procedure Note  Patient: Bianca Ware             Date of Birth: 07/22/1946           MRN: [id number]             Visit Date: 08/11/2018  Procedures: Visit Diagnoses: Unilateral primary osteoarthritis, right knee  Chronic pain of left knee  Chronic pain of right knee  Unilateral primary osteoarthritis, left knee  Large Joint Inj: L knee on 08/11/2018 2:29 PM Indications: diagnostic evaluation and pain Details: 22 G 1.5 in needle, superolateral approach  Arthrogram: No  Medications: 3 mL lidocaine 1 %; 88 mg Hyaluronan 88 MG/4ML Outcome: tolerated well, no immediate complications Procedure, treatment alternatives, risks and benefits explained, specific risks discussed. Consent was given by the patient. Immediately prior to procedure a time out was called to verify the correct patient, procedure, equipment, support staff and site/side marked as required. Patient was prepped and draped in the usual sterile fashion.   Large Joint Inj: R knee on 08/11/2018 2:29 PM Indications: diagnostic evaluation and pain Details: 22 G 1.5 in needle, superolateral approach  Arthrogram: No  Medications: 3 mL lidocaine 1 %; 40 mg methylPREDNISolone acetate 40 MG/ML Outcome: tolerated well, no immediate complications Procedure, treatment alternatives, risks and benefits explained, specific risks discussed. Consent was given by the patient. Immediately prior to procedure a time out was called to verify the correct patient, procedure, equipment, support staff and site/side marked as required. Patient was prepped and draped in the usual sterile fashion.    Patient is here today for scheduled hyaluronic acid injection with Monovisc in her left knee to treat the pain from osteoarthritis.  She is had this over 5 months ago in her right knee.  She would like to have a steroid injection in the right knee today.  She still has pain in both knees from osteoarthritis but is still not interested in any type of  knee replacement surgery.  The injections have helped temporize things for her.  On exam both knees have medial joint line tenderness with painful range of motion.  She tolerated the Monovisc injection in her left knee today and steroid injection in her right knee.  We will see her back in 4 weeks to place a Monovisc injection in her right knee since it will be over 6 months since her last hyaluronic acid injection in the right knee.  All question concerns were answered and addressed.

## 2018-08-12 ENCOUNTER — Telehealth (INDEPENDENT_AMBULATORY_CARE_PROVIDER_SITE_OTHER): Payer: Self-pay

## 2018-08-12 NOTE — Telephone Encounter (Signed)
Right knee gel injection  

## 2018-08-12 NOTE — Telephone Encounter (Signed)
Noted  

## 2018-08-12 NOTE — Telephone Encounter (Signed)
Submitted VOB for Monovisc, right knee. 

## 2018-08-24 ENCOUNTER — Telehealth (INDEPENDENT_AMBULATORY_CARE_PROVIDER_SITE_OTHER): Payer: Self-pay

## 2018-08-24 DIAGNOSIS — Z23 Encounter for immunization: Secondary | ICD-10-CM | POA: Diagnosis not present

## 2018-08-24 NOTE — Telephone Encounter (Signed)
Patient is approved for Monovisc, right knee. Buy & Bill Covered at 100% through patient's insurance No PA required No Co-pay  Appt.scheduled 09/13/2018

## 2018-09-03 DIAGNOSIS — E7849 Other hyperlipidemia: Secondary | ICD-10-CM | POA: Diagnosis not present

## 2018-09-03 DIAGNOSIS — R82998 Other abnormal findings in urine: Secondary | ICD-10-CM | POA: Diagnosis not present

## 2018-09-09 DIAGNOSIS — I8390 Asymptomatic varicose veins of unspecified lower extremity: Secondary | ICD-10-CM | POA: Diagnosis not present

## 2018-09-09 DIAGNOSIS — Z0000 Encounter for general adult medical examination without abnormal findings: Secondary | ICD-10-CM | POA: Diagnosis not present

## 2018-09-09 DIAGNOSIS — K589 Irritable bowel syndrome without diarrhea: Secondary | ICD-10-CM | POA: Diagnosis not present

## 2018-09-09 DIAGNOSIS — M5416 Radiculopathy, lumbar region: Secondary | ICD-10-CM | POA: Diagnosis not present

## 2018-09-09 DIAGNOSIS — E7849 Other hyperlipidemia: Secondary | ICD-10-CM | POA: Diagnosis not present

## 2018-09-09 DIAGNOSIS — J3089 Other allergic rhinitis: Secondary | ICD-10-CM | POA: Diagnosis not present

## 2018-09-09 DIAGNOSIS — Z6827 Body mass index (BMI) 27.0-27.9, adult: Secondary | ICD-10-CM | POA: Diagnosis not present

## 2018-09-09 DIAGNOSIS — M1990 Unspecified osteoarthritis, unspecified site: Secondary | ICD-10-CM | POA: Diagnosis not present

## 2018-09-09 DIAGNOSIS — Z1389 Encounter for screening for other disorder: Secondary | ICD-10-CM | POA: Diagnosis not present

## 2018-09-09 DIAGNOSIS — N951 Menopausal and female climacteric states: Secondary | ICD-10-CM | POA: Diagnosis not present

## 2018-09-09 DIAGNOSIS — Z01818 Encounter for other preprocedural examination: Secondary | ICD-10-CM | POA: Diagnosis not present

## 2018-09-13 ENCOUNTER — Ambulatory Visit (INDEPENDENT_AMBULATORY_CARE_PROVIDER_SITE_OTHER): Payer: Medicare Other | Admitting: Orthopaedic Surgery

## 2018-09-13 ENCOUNTER — Encounter (INDEPENDENT_AMBULATORY_CARE_PROVIDER_SITE_OTHER): Payer: Self-pay | Admitting: Orthopaedic Surgery

## 2018-09-13 DIAGNOSIS — M1711 Unilateral primary osteoarthritis, right knee: Secondary | ICD-10-CM

## 2018-09-13 DIAGNOSIS — Z1212 Encounter for screening for malignant neoplasm of rectum: Secondary | ICD-10-CM | POA: Diagnosis not present

## 2018-09-13 NOTE — Progress Notes (Signed)
The patient is well-known to me.  She has well-documented severe arthritis of her right knee.  She is gotten to where her pain is 10 out of 10 and it is daily.  Her right knee pain is detrimental effect directives daily living, quality of life, and her mobility.  We have tried steroid injections in her knee and this is not helped much.  She was going to try a hyaluronic acid injection today but she says her knee pain is gotten significantly worse.  X-rays in the past did confirm severe tricompartment arthritis of her right knee.  At this point she would rather proceed with knee replacement surgery.  She is 72 years old and very active.  On examination of her right knee she has severe medial joint line tenderness with varus malalignment is easily correctable.  She has patellofemoral rotation as well with full range of motion of the right knee.  Her right knee feels ligamentously stable.  We went over the knee x-rays in detail with her.  I talked about knee replacement surgery showing a knee model.  We had a long and thorough discussion about the risk and benefits of the surgery as well as what her intraoperative and postoperative course involved.  All question concerns were answered and addressed.  She is a very healthy individual and has had successful left hip replacement by [place].  She is ready to proceed with the surgery and I agree with this as well given the severity of arthritis and failure of conservative treatment measures for over a year now.  We will work on getting this scheduled and we will see her back in 2 weeks postoperative for further evaluation and treatment.

## 2018-09-21 ENCOUNTER — Other Ambulatory Visit (INDEPENDENT_AMBULATORY_CARE_PROVIDER_SITE_OTHER): Payer: Self-pay | Admitting: Physician Assistant

## 2018-09-22 ENCOUNTER — Encounter (HOSPITAL_COMMUNITY): Payer: Self-pay

## 2018-09-22 NOTE — Patient Instructions (Signed)
Bianca Ware             02-Apr-1946     Your procedure is scheduled on:  10-01-2018   Report to [facility] Main  Entrance,  Report to admitting at  10:15 AM     Call this number if you have problems the morning of surgery [phone]      Remember: Do not eat food after Midnight.  May have clear liquid diet from midnight until 6:45 AM day of surgery.  Nothing by mouth after 6:45 AM including water, candy, gum, mints                                      BRUSH YOUR TEETH MORNING OF SURGERY AND RINSE YOUR MOUTH OUT.      Take these medicines the morning of surgery with A SIP OF WATER:    NONE                                  You may not have any metal on your body including hair pins and piercings              Do not wear jewelry, make-up, lotions, powders or perfumes, deodorant              Do not wear nail polish.  Do not shave  48 hours prior to surgery.          Do not bring valuables to the hospital. [facility] IS NOT             RESPONSIBLE   FOR VALUABLES.  Contacts, dentures or bridgework may not be worn into surgery.  Leave suitcase in the car. After surgery it may be brought to your room.    Special Instructions: N/A           _____________________________________________________________________     CLEAR LIQUID DIET   Foods Allowed                                                                     Foods Excluded  Coffee and tea, regular and decaf                             liquids that you cannot  Plain Jell-O in any flavor                                             see through such as: Fruit ices (not with fruit pulp)                                     milk, soups, orange juice  Iced Popsicles  All solid food Carbonated beverages, regular and diet                                    Cranberry, grape and apple juices Sports drinks like Gatorade Lightly seasoned clear broth or  consume(fat free) Sugar, honey syrup  Sample Menu Breakfast                                Lunch                                     Supper Cranberry juice                    Beef broth                            Chicken broth Jell-O                                     Grape juice                           Apple juice Coffee or tea                        Jell-O                                      Popsicle                                                Coffee or tea                        Coffee or tea  _____________________________________________________________________             [facility] - Preparing for Surgery Before surgery, you can play an important role.  Because skin is not sterile, your skin needs to be as free of germs as possible.  You can reduce the number of germs on your skin by washing with CHG (chlorahexidine gluconate) soap before surgery.  CHG is an antiseptic cleaner which kills germs and bonds with the skin to continue killing germs even after washing. Please DO NOT use if you have an allergy to CHG or antibacterial soaps.  If your skin becomes reddened/irritated stop using the CHG and inform your nurse when you arrive at Short Stay. Do not shave (including legs and underarms) for at least 48 hours prior to the first CHG shower.  You may shave your face/neck. Please follow these instructions carefully:  1.  Shower with CHG Soap the night before surgery and the  morning of Surgery.  2.  If you choose to wash your hair, wash your hair first as usual with your  normal  shampoo.  3.  After you shampoo, rinse your hair and body thoroughly to remove the  shampoo.                            4.  Use CHG as you would any other liquid soap.  You can apply chg directly  to the skin and wash                       Gently with a scrungie or clean washcloth.  5.  Apply the CHG Soap to your body ONLY FROM THE NECK DOWN.   Do not use on face/ open                           Wound or  open sores. Avoid contact with eyes, ears mouth and genitals (private parts).                       Wash face,  Genitals (private parts) with your normal soap.             6.  Wash thoroughly, paying special attention to the area where your surgery  will be performed.  7.  Thoroughly rinse your body with warm water from the neck down.  8.  DO NOT shower/wash with your normal soap after using and rinsing off  the CHG Soap.             9.  Pat yourself dry with a clean towel.            10.  Wear clean pajamas.            11.  Place clean sheets on your bed the night of your first shower and do not  sleep with pets. Day of Surgery : Do not apply any lotions/deodorants the morning of surgery.  Please wear clean clothes to the hospital/surgery center.  FAILURE TO FOLLOW THESE INSTRUCTIONS MAY RESULT IN THE CANCELLATION OF YOUR SURGERY PATIENT SIGNATURE_________________________________  NURSE SIGNATURE__________________________________  ________________________________________________________________________   Bianca Ware  An incentive spirometer is a tool that can help keep your lungs clear and active. This tool measures how well you are filling your lungs with each breath. Taking long deep breaths may help reverse or decrease the chance of developing breathing (pulmonary) problems (especially infection) following:  A long period of time when you are unable to move or be active. BEFORE THE PROCEDURE   If the spirometer includes an indicator to show your best effort, your nurse or respiratory therapist will set it to a desired goal.  If possible, sit up straight or lean slightly forward. Try not to slouch.  Hold the incentive spirometer in an upright position. INSTRUCTIONS FOR USE  1. Sit on the edge of your bed if possible, or sit up as far as you can in bed or on a chair. 2. Hold the incentive spirometer in an upright position. 3. Breathe out normally. 4. Place the mouthpiece  in your mouth and seal your lips tightly around it. 5. Breathe in slowly and as deeply as possible, raising the piston or the ball toward the top of the column. 6. Hold your breath for 3-5 seconds or for as long as possible. Allow the piston or ball to fall to the bottom of the column. 7. Remove the mouthpiece from your mouth and breathe out normally. 8. Rest for a few seconds and repeat Steps 1 through 7 at least 10 times every 1-2  hours when you are awake. Take your time and take a few normal breaths between deep breaths. 9. The spirometer may include an indicator to show your best effort. Use the indicator as a goal to work toward during each repetition. 10. After each set of 10 deep breaths, practice coughing to be sure your lungs are clear. If you have an incision (the cut made at the time of surgery), support your incision when coughing by placing a pillow or rolled up towels firmly against it. Once you are able to get out of bed, walk around indoors and cough well. You may stop using the incentive spirometer when instructed by your caregiver.  RISKS AND COMPLICATIONS  Take your time so you do not get dizzy or light-headed.  If you are in pain, you may need to take or ask for pain medication before doing incentive spirometry. It is harder to take a deep breath if you are having pain. AFTER USE  Rest and breathe slowly and easily.  It can be helpful to keep track of a log of your progress. Your caregiver can provide you with a simple table to help with this. If you are using the spirometer at home, follow these instructions: SEEK MEDICAL CARE IF:   You are having difficultly using the spirometer.  You have trouble using the spirometer as often as instructed.  Your pain medication is not giving enough relief while using the spirometer.  You develop fever of 100.5 F (38.1 C) or higher. SEEK IMMEDIATE MEDICAL CARE IF:   You cough up bloody sputum that had not been present  before.  You develop fever of 102 F (38.9 C) or greater.  You develop worsening pain at or near the incision site. MAKE SURE YOU:   Understand these instructions.  Will watch your condition.  Will get help right away if you are not doing well or get worse. Document Released: 03/23/2007 Document Revised: 02/02/2012 Document Reviewed: 05/24/2007 [facility] Patient Information 2014 [place], [place].   ________________________________________________________________________

## 2018-09-23 ENCOUNTER — Encounter (HOSPITAL_COMMUNITY)
Admission: RE | Admit: 2018-09-23 | Discharge: 2018-09-23 | Disposition: A | Discharge status: Home / Self Care | Payer: Medicare Other | Source: Ambulatory Visit | Attending: Orthopaedic Surgery | Admitting: Orthopaedic Surgery

## 2018-09-23 ENCOUNTER — Encounter (HOSPITAL_COMMUNITY): Payer: Self-pay

## 2018-09-23 ENCOUNTER — Other Ambulatory Visit: Payer: Self-pay

## 2018-09-23 DIAGNOSIS — Z01812 Encounter for preprocedural laboratory examination: Secondary | ICD-10-CM | POA: Insufficient documentation

## 2018-09-23 HISTORY — DX: Other seasonal allergic rhinitis: J30.2

## 2018-09-23 HISTORY — DX: Presence of spectacles and contact lenses: Z97.3

## 2018-09-23 HISTORY — DX: Unilateral primary osteoarthritis, unspecified knee: M17.10

## 2018-09-23 HISTORY — DX: Osteoarthritis of knee, unspecified: M17.9

## 2018-09-23 LAB — BASIC METABOLIC PANEL
Anion gap: 8 (ref 5–15)
BUN: 18 mg/dL (ref 8–23)
CALCIUM: 9.4 mg/dL (ref 8.9–10.3)
CO2: 28 mmol/L (ref 22–32)
CREATININE: 0.72 mg/dL (ref 0.44–1.00)
Chloride: 104 mmol/L (ref 98–111)
GFR calc non Af Amer: >60 mL/min (ref >60)
Glucose, Bld: 125 mg/dL — ABNORMAL HIGH (ref 70–99)
Potassium: 4.1 mmol/L (ref 3.5–5.1)
SODIUM: 140 mmol/L (ref 135–145)

## 2018-09-23 LAB — CBC
HCT: 42.8 % (ref 36.0–46.0)
Hemoglobin: 13.6 g/dL (ref 12.0–15.0)
MCH: 30 pg (ref 26.0–34.0)
MCHC: 31.8 g/dL (ref 30.0–36.0)
MCV: 94.3 fL (ref 80.0–100.0)
PLATELETS: 230 10*3/uL (ref 150–400)
RBC: 4.54 MIL/uL (ref 3.87–5.11)
RDW: 13.6 % (ref 11.5–15.5)
WBC: 8.1 10*3/uL (ref 4.0–10.5)
nRBC: 0 % (ref 0.0–0.2)

## 2018-09-23 LAB — SURGICAL PCR SCREEN
MRSA, PCR: NEGATIVE
Staphylococcus aureus: NEGATIVE

## 2018-09-30 NOTE — Progress Notes (Signed)
Called and spoke w/ pt via phone for change of surgery start time.  Pt verbalized understanding to arrive at [facility] 10-01-2018 and is to be npo after mn including no clear liquids after mn, as previously instructed. Surgery start time changed to 1100.

## 2018-10-01 ENCOUNTER — Ambulatory Visit (HOSPITAL_COMMUNITY): Payer: Medicare Other | Admitting: Anesthesiology

## 2018-10-01 ENCOUNTER — Other Ambulatory Visit: Payer: Self-pay

## 2018-10-01 ENCOUNTER — Encounter (HOSPITAL_COMMUNITY)
Admission: RE | Disposition: A | Discharge status: Home Health | Payer: Self-pay | Source: Home / Self Care | Attending: Orthopaedic Surgery

## 2018-10-01 ENCOUNTER — Inpatient Hospital Stay (HOSPITAL_COMMUNITY): Payer: Medicare Other

## 2018-10-01 ENCOUNTER — Inpatient Hospital Stay (HOSPITAL_COMMUNITY)
Admission: RE | Admit: 2018-10-01 | Discharge: 2018-10-03 | DRG: 470 | Disposition: A | Discharge status: Home Health | Payer: Medicare Other | Attending: Orthopaedic Surgery | Admitting: Orthopaedic Surgery

## 2018-10-01 ENCOUNTER — Encounter (HOSPITAL_COMMUNITY): Payer: Self-pay | Admitting: *Deleted

## 2018-10-01 DIAGNOSIS — Z96651 Presence of right artificial knee joint: Secondary | ICD-10-CM | POA: Diagnosis not present

## 2018-10-01 DIAGNOSIS — Z90710 Acquired absence of both cervix and uterus: Secondary | ICD-10-CM

## 2018-10-01 DIAGNOSIS — Z961 Presence of intraocular lens: Secondary | ICD-10-CM | POA: Diagnosis not present

## 2018-10-01 DIAGNOSIS — M25761 Osteophyte, right knee: Secondary | ICD-10-CM | POA: Diagnosis present

## 2018-10-01 DIAGNOSIS — M170 Bilateral primary osteoarthritis of knee: Principal | ICD-10-CM | POA: Diagnosis present

## 2018-10-01 DIAGNOSIS — M1711 Unilateral primary osteoarthritis, right knee: Secondary | ICD-10-CM | POA: Diagnosis not present

## 2018-10-01 DIAGNOSIS — Z9842 Cataract extraction status, left eye: Secondary | ICD-10-CM | POA: Diagnosis not present

## 2018-10-01 DIAGNOSIS — M25461 Effusion, right knee: Secondary | ICD-10-CM | POA: Diagnosis present

## 2018-10-01 DIAGNOSIS — Z87891 Personal history of nicotine dependence: Secondary | ICD-10-CM

## 2018-10-01 DIAGNOSIS — R42 Dizziness and giddiness: Secondary | ICD-10-CM | POA: Diagnosis not present

## 2018-10-01 DIAGNOSIS — G8918 Other acute postprocedural pain: Secondary | ICD-10-CM | POA: Diagnosis not present

## 2018-10-01 DIAGNOSIS — M25561 Pain in right knee: Secondary | ICD-10-CM | POA: Diagnosis not present

## 2018-10-01 DIAGNOSIS — Z471 Aftercare following joint replacement surgery: Secondary | ICD-10-CM | POA: Diagnosis not present

## 2018-10-01 HISTORY — PX: TOTAL KNEE ARTHROPLASTY: SHX125

## 2018-10-01 SURGERY — ARTHROPLASTY, KNEE, TOTAL
Anesthesia: Monitor Anesthesia Care | Site: Knee | Laterality: Right

## 2018-10-01 MED ORDER — FENTANYL CITRATE (PF) 100 MCG/2ML IJ SOLN
INTRAMUSCULAR | Status: AC
  Filled 2018-10-01: qty 2

## 2018-10-01 MED ORDER — PROPOFOL 10 MG/ML IV BOLUS
INTRAVENOUS | Status: AC
  Filled 2018-10-01: qty 40

## 2018-10-01 MED ORDER — ACETAMINOPHEN 325 MG PO TABS
325.0000 mg | ORAL_TABLET | Freq: Four times a day (QID) | ORAL | Status: DC | PRN
  Administered 2018-10-02 (×2): 650 mg via ORAL
  Filled 2018-10-01 (×2): qty 2

## 2018-10-01 MED ORDER — PHENOL 1.4 % MT LIQD
1.0000 | OROMUCOSAL | Status: DC | PRN
  Filled 2018-10-01: qty 177

## 2018-10-01 MED ORDER — VITAMIN C 500 MG PO TABS
500.0000 mg | ORAL_TABLET | Freq: Every day | ORAL | Status: DC
  Administered 2018-10-01 – 2018-10-03 (×2): 500 mg via ORAL
  Filled 2018-10-01 (×2): qty 1

## 2018-10-01 MED ORDER — BIOTIN 5000 MCG PO CAPS: 5000.0000 ug | ORAL_CAPSULE | Freq: Two times a day (BID) | ORAL | Status: DC

## 2018-10-01 MED ORDER — OXYCODONE HCL 5 MG PO TABS
10.0000 mg | ORAL_TABLET | ORAL | Status: DC | PRN
  Administered 2018-10-01: 10 mg via ORAL
  Administered 2018-10-01 – 2018-10-02 (×2): 15 mg via ORAL
  Administered 2018-10-02: 10 mg via ORAL
  Administered 2018-10-03: 15 mg via ORAL
  Filled 2018-10-01: qty 2
  Filled 2018-10-01 (×3): qty 3

## 2018-10-01 MED ORDER — DOCUSATE SODIUM 100 MG PO CAPS
100.0000 mg | ORAL_CAPSULE | Freq: Two times a day (BID) | ORAL | Status: DC
  Administered 2018-10-01 – 2018-10-03 (×3): 100 mg via ORAL
  Filled 2018-10-01 (×3): qty 1

## 2018-10-01 MED ORDER — VITAMIN D 50 MCG (2000 UT) PO TABS: 2000.0000 [IU] | ORAL_TABLET | Freq: Every day | ORAL | Status: DC

## 2018-10-01 MED ORDER — ONDANSETRON HCL 4 MG/2ML IJ SOLN
INTRAMUSCULAR | Status: DC | PRN
  Administered 2018-10-01: 4 mg via INTRAVENOUS

## 2018-10-01 MED ORDER — CEFAZOLIN SODIUM-DEXTROSE 2-4 GM/100ML-% IV SOLN
2.0000 g | INTRAVENOUS | Status: AC
  Administered 2018-10-01: 2 g via INTRAVENOUS
  Filled 2018-10-01: qty 100

## 2018-10-01 MED ORDER — ROSUVASTATIN CALCIUM 20 MG PO TABS
40.0000 mg | ORAL_TABLET | Freq: Every evening | ORAL | Status: DC
  Administered 2018-10-01 – 2018-10-02 (×2): 40 mg via ORAL
  Filled 2018-10-01 (×2): qty 2

## 2018-10-01 MED ORDER — LACTATED RINGERS IV SOLN
INTRAVENOUS | Status: DC
  Administered 2018-10-01 (×2): via INTRAVENOUS

## 2018-10-01 MED ORDER — CEFAZOLIN SODIUM-DEXTROSE 1-4 GM/50ML-% IV SOLN
1.0000 g | Freq: Four times a day (QID) | INTRAVENOUS | Status: AC
  Administered 2018-10-01 (×2): 1 g via INTRAVENOUS
  Filled 2018-10-01 (×2): qty 50

## 2018-10-01 MED ORDER — ONDANSETRON HCL 4 MG PO TABS
4.0000 mg | ORAL_TABLET | Freq: Four times a day (QID) | ORAL | Status: DC | PRN
  Administered 2018-10-02: 4 mg via ORAL
  Filled 2018-10-01: qty 1

## 2018-10-01 MED ORDER — VITAMIN D 25 MCG (1000 UNIT) PO TABS
2000.0000 [IU] | ORAL_TABLET | Freq: Every day | ORAL | Status: DC
  Administered 2018-10-01 – 2018-10-03 (×2): 2000 [IU] via ORAL

## 2018-10-01 MED ORDER — ROPIVACAINE HCL 7.5 MG/ML IJ SOLN
INTRAMUSCULAR | Status: DC | PRN
  Administered 2018-10-01: 20 mL via PERINEURAL

## 2018-10-01 MED ORDER — SODIUM CHLORIDE 0.9 % IV SOLN
INTRAVENOUS | Status: DC
  Administered 2018-10-01 – 2018-10-02 (×2): via INTRAVENOUS

## 2018-10-01 MED ORDER — PROPOFOL 500 MG/50ML IV EMUL
INTRAVENOUS | Status: DC | PRN
  Administered 2018-10-01 (×4): 20 mg via INTRAVENOUS

## 2018-10-01 MED ORDER — SODIUM CHLORIDE 0.9 % IV SOLN
INTRAVENOUS | Status: DC | PRN
  Administered 2018-10-01: 25 ug/min via INTRAVENOUS

## 2018-10-01 MED ORDER — OXYCODONE HCL 5 MG/5ML PO SOLN
5.0000 mg | Freq: Once | ORAL | Status: DC | PRN
  Filled 2018-10-01: qty 5

## 2018-10-01 MED ORDER — MELATONIN 3 MG PO TABS
12.0000 mg | ORAL_TABLET | Freq: Every day | ORAL | Status: DC
  Administered 2018-10-01 – 2018-10-02 (×2): 12 mg via ORAL
  Filled 2018-10-01 (×2): qty 4

## 2018-10-01 MED ORDER — HYDROMORPHONE HCL 1 MG/ML IJ SOLN
0.5000 mg | INTRAMUSCULAR | Status: DC | PRN
  Administered 2018-10-01: 1 mg via INTRAVENOUS
  Administered 2018-10-01: 0.5 mg via INTRAVENOUS
  Filled 2018-10-01 (×2): qty 1

## 2018-10-01 MED ORDER — FENTANYL CITRATE (PF) 100 MCG/2ML IJ SOLN
25.0000 ug | INTRAMUSCULAR | Status: DC | PRN
  Administered 2018-10-01: 50 ug via INTRAVENOUS

## 2018-10-01 MED ORDER — OXYCODONE HCL 5 MG PO TABS: 5.0000 mg | ORAL_TABLET | Freq: Once | ORAL | Status: DC | PRN

## 2018-10-01 MED ORDER — METHOCARBAMOL 500 MG PO TABS
500.0000 mg | ORAL_TABLET | Freq: Four times a day (QID) | ORAL | Status: DC | PRN
  Administered 2018-10-01 – 2018-10-02 (×4): 500 mg via ORAL
  Filled 2018-10-01 (×5): qty 1

## 2018-10-01 MED ORDER — ONDANSETRON HCL 4 MG/2ML IJ SOLN: 4.0000 mg | Freq: Four times a day (QID) | INTRAMUSCULAR | Status: DC | PRN

## 2018-10-01 MED ORDER — METHOCARBAMOL 500 MG IVPB - SIMPLE MED
500.0000 mg | Freq: Four times a day (QID) | INTRAVENOUS | Status: DC | PRN
  Administered 2018-10-01: 500 mg via INTRAVENOUS
  Filled 2018-10-01: qty 50

## 2018-10-01 MED ORDER — OXYCODONE HCL 5 MG PO TABS
5.0000 mg | ORAL_TABLET | ORAL | Status: DC | PRN
  Administered 2018-10-01 – 2018-10-02 (×3): 5 mg via ORAL
  Filled 2018-10-01 (×3): qty 1
  Filled 2018-10-01: qty 2

## 2018-10-01 MED ORDER — ALUM & MAG HYDROXIDE-SIMETH 200-200-20 MG/5ML PO SUSP: 30.0000 mL | ORAL | Status: DC | PRN

## 2018-10-01 MED ORDER — METHOCARBAMOL 500 MG IVPB - SIMPLE MED
INTRAVENOUS | Status: AC
  Administered 2018-10-01: 500 mg via INTRAVENOUS
  Filled 2018-10-01: qty 50

## 2018-10-01 MED ORDER — PANTOPRAZOLE SODIUM 40 MG PO TBEC
40.0000 mg | DELAYED_RELEASE_TABLET | Freq: Every day | ORAL | Status: DC
  Administered 2018-10-01: 40 mg via ORAL
  Filled 2018-10-01: qty 1

## 2018-10-01 MED ORDER — MELATONIN 3 MG PO TABS: 12.0000 mg | ORAL_TABLET | Freq: Every day | ORAL | Status: DC

## 2018-10-01 MED ORDER — SODIUM CHLORIDE 0.9 % IR SOLN
Status: DC | PRN
  Administered 2018-10-01: 1000 mL

## 2018-10-01 MED ORDER — ONDANSETRON HCL 4 MG/2ML IJ SOLN
4.0000 mg | Freq: Four times a day (QID) | INTRAMUSCULAR | Status: DC | PRN
  Administered 2018-10-02: 4 mg via INTRAVENOUS
  Filled 2018-10-01: qty 2

## 2018-10-01 MED ORDER — MIDAZOLAM HCL 2 MG/2ML IJ SOLN
1.0000 mg | INTRAMUSCULAR | Status: DC
  Filled 2018-10-01: qty 2

## 2018-10-01 MED ORDER — PHENYLEPHRINE HCL 10 MG/ML IJ SOLN
INTRAMUSCULAR | Status: AC
  Filled 2018-10-01: qty 1

## 2018-10-01 MED ORDER — TRANEXAMIC ACID-NACL 1000-0.7 MG/100ML-% IV SOLN
1000.0000 mg | INTRAVENOUS | Status: AC
  Administered 2018-10-01: 1000 mg via INTRAVENOUS
  Filled 2018-10-01: qty 100

## 2018-10-01 MED ORDER — METOCLOPRAMIDE HCL 5 MG/ML IJ SOLN
5.0000 mg | Freq: Three times a day (TID) | INTRAMUSCULAR | Status: DC | PRN
  Administered 2018-10-02: 10 mg via INTRAVENOUS
  Filled 2018-10-01: qty 2

## 2018-10-01 MED ORDER — PROPOFOL 10 MG/ML IV BOLUS
INTRAVENOUS | Status: AC
  Filled 2018-10-01: qty 20

## 2018-10-01 MED ORDER — CHLORHEXIDINE GLUCONATE 4 % EX LIQD: 60.0000 mL | Freq: Once | CUTANEOUS | Status: DC

## 2018-10-01 MED ORDER — PROPOFOL 500 MG/50ML IV EMUL
INTRAVENOUS | Status: DC | PRN
  Administered 2018-10-01: 50 ug/kg/min via INTRAVENOUS

## 2018-10-01 MED ORDER — DIPHENHYDRAMINE HCL 12.5 MG/5ML PO ELIX: 12.5000 mg | ORAL_SOLUTION | ORAL | Status: DC | PRN

## 2018-10-01 MED ORDER — FENTANYL CITRATE (PF) 100 MCG/2ML IJ SOLN
50.0000 ug | INTRAMUSCULAR | Status: DC
  Administered 2018-10-01: 50 ug via INTRAVENOUS
  Filled 2018-10-01: qty 2

## 2018-10-01 MED ORDER — MENTHOL 3 MG MT LOZG: 1.0000 | LOZENGE | OROMUCOSAL | Status: DC | PRN

## 2018-10-01 MED ORDER — METOCLOPRAMIDE HCL 5 MG PO TABS: 5.0000 mg | ORAL_TABLET | Freq: Three times a day (TID) | ORAL | Status: DC | PRN

## 2018-10-01 MED ORDER — STERILE WATER FOR IRRIGATION IR SOLN
Status: DC | PRN
  Administered 2018-10-01: 2000 mL

## 2018-10-01 MED ORDER — LORATADINE 10 MG PO TABS
10.0000 mg | ORAL_TABLET | Freq: Every day | ORAL | Status: DC
  Administered 2018-10-01 – 2018-10-03 (×2): 10 mg via ORAL
  Filled 2018-10-01 (×2): qty 1

## 2018-10-01 MED ORDER — ASPIRIN EC 325 MG PO TBEC
325.0000 mg | DELAYED_RELEASE_TABLET | Freq: Two times a day (BID) | ORAL | Status: DC
  Administered 2018-10-02 – 2018-10-03 (×3): 325 mg via ORAL
  Filled 2018-10-01 (×3): qty 1

## 2018-10-01 MED ORDER — POLYETHYLENE GLYCOL 3350 17 G PO PACK: 17.0000 g | PACK | Freq: Every day | ORAL | Status: DC | PRN

## 2018-10-01 SURGICAL SUPPLY — 59 items
APL SKNCLS STERI-STRIP NONHPOA (GAUZE/BANDAGES/DRESSINGS) ×1
BAG SPEC THK2 15X12 ZIP CLS (MISCELLANEOUS)
BAG ZIPLOCK 12X15 (MISCELLANEOUS) IMPLANT
BANDAGE ACE 6X5 VEL STRL LF (GAUZE/BANDAGES/DRESSINGS) ×3 IMPLANT
BANDAGE ELASTIC 6 VELCRO ST LF (GAUZE/BANDAGES/DRESSINGS) ×2 IMPLANT
BEARIN INSERT TIBIAL SZ 3 11 (Insert) ×3 IMPLANT
BEARING INSERT TIBIAL SZ 3 11 (Insert) IMPLANT
BENZOIN TINCTURE PRP APPL 2/3 (GAUZE/BANDAGES/DRESSINGS) ×2 IMPLANT
BLADE SAG 18X100X1.27 (BLADE) ×2 IMPLANT
BOWL SMART MIX CTS (DISPOSABLE) IMPLANT
BSPLAT TIB 3 KN TRITANIUM (Knees) ×1 IMPLANT
CLOSURE WOUND 1/2 X4 (GAUZE/BANDAGES/DRESSINGS) ×1
COVER SURGICAL LIGHT HANDLE (MISCELLANEOUS) ×3 IMPLANT
COVER WAND RF STERILE (DRAPES) ×2 IMPLANT
CUFF TOURN SGL QUICK 34 (TOURNIQUET CUFF) ×3
CUFF TRNQT CYL 34X4X40X1 (TOURNIQUET CUFF) ×1 IMPLANT
DECANTER SPIKE VIAL GLASS SM (MISCELLANEOUS) IMPLANT
DRAPE U-SHAPE 47X51 STRL (DRAPES) ×3 IMPLANT
DRSG PAD ABDOMINAL 8X10 ST (GAUZE/BANDAGES/DRESSINGS) ×3 IMPLANT
DURAPREP 26ML APPLICATOR (WOUND CARE) ×3 IMPLANT
ELECT REM PT RETURN 15FT ADLT (MISCELLANEOUS) ×3 IMPLANT
FEMORAL POSTERIOR SZ4 RT (Femur) IMPLANT
GAUZE SPONGE 4X4 12PLY STRL (GAUZE/BANDAGES/DRESSINGS) ×3 IMPLANT
GAUZE XEROFORM 1X8 LF (GAUZE/BANDAGES/DRESSINGS) ×2 IMPLANT
GLOVE BIO SURGEON STRL SZ7.5 (GLOVE) ×3 IMPLANT
GLOVE BIOGEL PI IND STRL 8 (GLOVE) ×2 IMPLANT
GLOVE BIOGEL PI INDICATOR 8 (GLOVE) ×4
GLOVE ECLIPSE 8.0 STRL XLNG CF (GLOVE) ×3 IMPLANT
GOWN STRL REUS W/TWL XL LVL3 (GOWN DISPOSABLE) ×6 IMPLANT
HANDPIECE INTERPULSE COAX TIP (DISPOSABLE) ×3
HOLDER FOLEY CATH W/STRAP (MISCELLANEOUS) ×2 IMPLANT
IMMOBILIZER KNEE 20 (SOFTGOODS) ×5 IMPLANT
IMMOBILIZER KNEE 20 THIGH 36 (SOFTGOODS) ×1 IMPLANT
KNEE PATELLA ASYMMETRIC 9X29 (Knees) ×2 IMPLANT
KNEE TIBIAL COMPONENT SZ3 (Knees) ×2 IMPLANT
NDL SAFETY ECLIPSE 18X1.5 (NEEDLE) IMPLANT
NEEDLE HYPO 18GX1.5 SHARP (NEEDLE)
NS IRRIG 1000ML POUR BTL (IV SOLUTION) ×3 IMPLANT
PACK TOTAL KNEE CUSTOM (KITS) ×3 IMPLANT
PAD ABD 7.5X8 STRL (GAUZE/BANDAGES/DRESSINGS) ×2 IMPLANT
PADDING CAST COTTON 6X4 STRL (CAST SUPPLIES) ×4 IMPLANT
POSITIONER SURGICAL ARM (MISCELLANEOUS) ×3 IMPLANT
POSTERIOR FEMORAL SZ4 RT (Femur) ×3 IMPLANT
SET HNDPC FAN SPRY TIP SCT (DISPOSABLE) ×1 IMPLANT
SET PAD KNEE POSITIONER (MISCELLANEOUS) ×3 IMPLANT
STAPLER VISISTAT 35W (STAPLE) IMPLANT
STRIP CLOSURE SKIN 1/2X4 (GAUZE/BANDAGES/DRESSINGS) ×1 IMPLANT
SUT MNCRL AB 4-0 PS2 18 (SUTURE) IMPLANT
SUT VIC AB 0 CT1 27 (SUTURE) ×3
SUT VIC AB 0 CT1 27XBRD ANTBC (SUTURE) ×1 IMPLANT
SUT VIC AB 1 CT1 36 (SUTURE) ×6 IMPLANT
SUT VIC AB 2-0 CT1 27 (SUTURE) ×6
SUT VIC AB 2-0 CT1 TAPERPNT 27 (SUTURE) ×2 IMPLANT
SYR 3ML LL SCALE MARK (SYRINGE) IMPLANT
TRAY FOLEY CATH 14FRSI W/METER (CATHETERS) ×2 IMPLANT
TRAY FOLEY MTR SLVR 16FR STAT (SET/KITS/TRAYS/PACK) ×1 IMPLANT
WATER STERILE IRR 1000ML POUR (IV SOLUTION) ×5 IMPLANT
WRAP KNEE MAXI GEL POST OP (GAUZE/BANDAGES/DRESSINGS) ×3 IMPLANT
YANKAUER SUCT BULB TIP 10FT TU (MISCELLANEOUS) ×3 IMPLANT

## 2018-10-01 NOTE — Evaluation (Signed)
Physical Therapy Evaluation Patient Details Name: Bianca Ware MRN: [id number] DOB: Dec 16, 1945 Today's Date: 10/01/2018   History of Present Illness  Pt s/p R TKR and with hx of L TKR and lumbar laminectomy  Clinical Impression  Pt s/p R TKR and presents with decreased R LE strength/ROM and post op pain limiting functional mobility.  Pt should progress to dc home with family assist.    Follow Up Recommendations Follow surgeon's recommendation for DC plan and follow-up therapies    Equipment Recommendations  None recommended by PT    Recommendations for Other Services       Precautions / Restrictions Precautions Precautions: Fall Restrictions Weight Bearing Restrictions: No      Mobility  Bed Mobility Overal bed mobility: Needs Assistance Bed Mobility: Supine to Sit     Supine to sit: Min assist;Mod assist     General bed mobility comments: cues for sequence and use of L LE to self assist  Transfers Overall transfer level: Needs assistance   Transfers: Sit to/from Stand Sit to Stand: Min assist;Mod assist;From elevated surface         General transfer comment: cues for LE management and use of UEs to self assist  Ambulation/Gait Ambulation/Gait assistance: Min assist;+2 safety/equipment Gait Distance (Feet): 24 Feet Assistive device: Rolling walker (2 wheeled) Gait Pattern/deviations: Step-to pattern;Decreased step length - right;Decreased step length - left;Shuffle;Trunk flexed Gait velocity: decr   General Gait Details: cues for sequence, posture and position from RW; Distance ltd by onset dizziness  Stairs            Wheelchair Mobility    Modified Rankin (Stroke Patients Only)       Balance Overall balance assessment: Mild deficits observed, not formally tested                                           Pertinent Vitals/Pain Pain Assessment: 0-10 Pain Score: 3  Pain Location: R knee Pain Descriptors / Indicators:  Aching;Sore Pain Intervention(s): Limited activity within patient's tolerance;Monitored during session;Premedicated before session;Ice applied    Home Living Family/patient expects to be discharged to:: Private residence Living Arrangements: Spouse/significant other Available Help at Discharge: Family Type of Home: House Home Access: Stairs to enter Entrance Stairs-Rails: Right;Left;Can reach both Entrance Stairs-Number of Steps: 2+1  Home Layout: One level Home Equipment: Environmental consultant - 2 wheels;Cane - single point;Bedside commode      Prior Function Level of Independence: Independent               Hand Dominance        Extremity/Trunk Assessment   Upper Extremity Assessment Upper Extremity Assessment: Overall WFL for tasks assessed    Lower Extremity Assessment Lower Extremity Assessment: RLE deficits/detail       Communication   Communication: No difficulties  Cognition Arousal/Alertness: Awake/alert Behavior During Therapy: WFL for tasks assessed/performed Overall Cognitive Status: Within Functional Limits for tasks assessed                                        General Comments      Exercises Total Joint Exercises Ankle Circles/Pumps: AROM;Both;15 reps;Supine   Assessment/Plan    PT Assessment    PT Problem List         PT Treatment Interventions  PT Goals (Current goals can be found in the Care Plan section)  Acute Rehab PT Goals Patient Stated Goal: Regain IND PT Goal Formulation: With patient Time For Goal Achievement: 10/08/18 Potential to Achieve Goals: Good    Frequency     Barriers to discharge        Co-evaluation               AM-PAC PT "6 Clicks" Daily Activity  Outcome Measure Difficulty turning over in bed (including adjusting bedclothes, sheets and blankets)?: Unable Difficulty moving from lying on back to sitting on the side of the bed? : Unable Difficulty sitting down on and standing up from a  chair with arms (e.g., wheelchair, bedside commode, etc,.)?: Unable Help needed moving to and from a bed to chair (including a wheelchair)?: A Lot Help needed walking in hospital room?: A Little Help needed climbing 3-5 steps with a railing? : A Lot 6 Click Score: 10    End of Session Equipment Utilized During Treatment: Gait belt Activity Tolerance: Patient tolerated treatment well Patient left: in chair;with call bell/phone within reach;with family/visitor present Nurse Communication: Mobility status PT Visit Diagnosis: Difficulty in walking, not elsewhere classified (R26.2)    Time: 1640-1710 PT Time Calculation (min) (ACUTE ONLY): 30 min   Charges:   PT Evaluation $PT Eval Low Complexity: 1 Low PT Treatments $Gait Training: 8-22 mins        Mauro Kaufmann PT Acute Rehabilitation Services Pager [phone] Office [phone]   Shima Compere 10/01/2018, 6:25 PM

## 2018-10-01 NOTE — Anesthesia Postprocedure Evaluation (Signed)
Anesthesia Post Note  Patient: Bianca Ware  Procedure(s) Performed: RIGHT TOTAL KNEE ARTHROPLASTY (Right Knee)     Patient location during evaluation: PACU Anesthesia Type: MAC and Spinal Level of consciousness: oriented and awake and alert Pain management: pain level controlled Vital Signs Assessment: post-procedure vital signs reviewed and stable Respiratory status: spontaneous breathing, respiratory function stable and patient connected to nasal cannula oxygen Cardiovascular status: blood pressure returned to baseline and stable Postop Assessment: no headache, no backache and no apparent nausea or vomiting Anesthetic complications: no    Last Vitals:  Vitals:   10/01/18 1525 10/01/18 1625  BP: (!) 148/60 (!) 146/55  Pulse: 62 (!) 52  Resp: 16   Temp: 36.7 C 36.7 C  SpO2: 98% 96%    Last Pain:  Vitals:   10/01/18 1625  TempSrc: Oral  PainSc:                  Clovis S

## 2018-10-01 NOTE — Anesthesia Preprocedure Evaluation (Addendum)
Anesthesia Evaluation  Patient identified by MRN, date of birth, ID band Patient awake    Reviewed: Allergy & Precautions, H&P , NPO status , Patient's Chart, lab work & pertinent test results  History of Anesthesia Complications (+) PONV and history of anesthetic complications  Airway Mallampati: II   Neck ROM: full    Dental   Pulmonary former smoker,    breath sounds clear to auscultation       Cardiovascular negative cardio ROS   Rhythm:regular Rate:Normal     Neuro/Psych  Neuromuscular disease    GI/Hepatic   Endo/Other    Renal/GU      Musculoskeletal  (+) Arthritis ,   Abdominal   Peds  Hematology   Anesthesia Other Findings   Reproductive/Obstetrics                             Anesthesia Physical Anesthesia Plan  ASA: II  Anesthesia Plan: Spinal and MAC   Post-op Pain Management:  Regional for Post-op pain   Induction: Intravenous  PONV Risk Score and Plan: 3 and Ondansetron, Propofol infusion and Treatment may vary due to age or medical condition  Airway Management Planned: Simple Face Mask  Additional Equipment:   Intra-op Plan:   Post-operative Plan:   Informed Consent: I have reviewed the patients History and Physical, chart, labs and discussed the procedure including the risks, benefits and alternatives for the proposed anesthesia with the patient or authorized representative who has indicated his/her understanding and acceptance.     Plan Discussed with: CRNA, Anesthesiologist and Surgeon  Anesthesia Plan Comments:         Anesthesia Quick Evaluation

## 2018-10-01 NOTE — Op Note (Signed)
NAME: Bianca Ware, Bianca Ware MEDICAL RECORD [id number] ACCOUNT [id number] DATE OF BIRTH:11/25/1945 FACILITY: [facility] LOCATION: [facility] PHYSICIAN:Sammye Staff Kerry Fort, MD  OPERATIVE REPORT  DATE OF PROCEDURE:  10/01/2018  PREOPERATIVE DIAGNOSES:  Primary osteoarthritis and degenerative joint disease, right knee.  POSTOPERATIVE DIAGNOSES:  Primary osteoarthritis and degenerative joint disease, right knee.  PROCEDURE:  Right total knee arthroplasty.  IMPLANTS:  Stryker Triathlon press-fit knee system, a size 4 femur, size 3 tibial tray, 11 mm fixed-bearing polyethylene insert, size 29 patellar button.  SURGEON:  Lind Guest. Ninfa Linden, MD  ASSISTANT:  Erskine Emery, PA-C  ANESTHESIA: 1.  Right lower extremity adductor canal block. 2.  Spinal.  TOURNIQUET TIME:  Less than 1 hour.  ANTIBIOTICS:  Two grams IV Ancef.  ESTIMATED BLOOD LOSS:  Less than 100 mL.  COMPLICATIONS:  None.  INDICATIONS:  The patient is a very pleasant 72 year old female that I have known for a long period of time.  She has developed debilitating arthritis of her right knee, and at this point she has tried and failed all forms of conservative treatment.  Her  pain is daily, and it has detrimentally affected her mobility, her quality of life, and her activities of daily living.  It is at a point she does wish to proceed with a total knee arthroplasty.  She understands the risk of acute blood loss anemia,  nerve or vessel injury, fracture, infection, DVT, and implant failure.  She understands her goals are to decrease pain, improve mobility and overall improve quality of life.  DESCRIPTION OF PROCEDURE:  After informed consent was obtained and appropriate right knee was marked, she was brought to the operating room, and spinal anesthesia was obtained while she was sitting up on the OR table.  Of note, in the holding room, she  did have a right lower extremity adductor canal block.  A Foley catheter was placed,  and a nonsterile tourniquet was placed around her upper right thigh, and her right thigh, knee, leg and ankle were prepped and draped with DuraPrep and sterile drapes  including a sterile stockinette.  A time-out was called, and she was identified as correct patient, correct right knee.  We then used an Esmarch to wrap that leg, and tourniquet was inflated to 300 mm of pressure.  We made an incision over the patella  and carried this proximally and distally and dissected down the knee joint and carried down the medial parapatellar arthrotomy finding moderate joint effusion but significant severe arthritis throughout her right knee.  We removed remnants of ACL, PCL,  medial and lateral meniscus as well as periarticular osteophytes.  With the knee in a flexed position, we set our extramedullary cutting guide to take 9 mm off the high side of the tibia for a proximal tibia cut, correction for varus and valgus and a  neutral slope.  We made this cut without difficulty.  We then went to the femur, and in the intercondylar area, drilled a hole for our intramedullary guide for making our distal femoral cut for a right knee at 5 degrees externally rotated for an 8 mm  distal femoral cut.  We made this cut without difficulty and brought the knee back down to full extension with a 9 mm extension block and achieved full extension.  We then went back to the femur and put our femoral sizing guide based off the epicondylar  axis and Whiteside line.  We then chose a size 4 femur.  We put our 4-in-1 cutting block  for a size 4 femur, made our anterior and posterior cuts followed by our chamfer cuts.  We then made our femoral box cut.  Next, we were able to get back to the  tibia and choose a size 3 tibial tray for coverage.  Of note, she had very solid bone throughout, so we felt that we could proceed with a press-fit system.  We did our keel punch for a size 3 tibia.  With a size 3 tibial tray setting a rotation off the   tibial tubercle and the femur, we placed a size 4 trial femur and then an 11 mm fixed-bearing polyethylene insert because this is where I felt our flexion and extension gaps  was met, and we were pleased with range of motion and stability.  We then made  our patellar cut and drilled 3 holes for a press-fit size 29 patellar button.  We then removed all instrumentation from the knee and irrigated the knee with normal saline solution using pulsatile lavage.  We dried the knee very well, and then with the  knee in a flexed position, we placed our real Stryker Triathlon press-fit tibial tray size 3 followed by our size 3 press-fit right femur.  We placed our 11 mm fixed-bearing polyethylene insert and press-fit our patellar button.  The tourniquet was then  let down and hemostasis obtained with electrocautery.  I put the knee through a range of motion.  I was pleased with the stability and motion of the knee.  We then closed the arthrotomy with interrupted #1 Vicryl suture followed by 0 Vicryl in the deep  tissue, 2-0 Vicryl subcutaneous tissue, 4-0 Monocryl subcuticular stitch and Steri-Strips on the skin.  A well-padded sterile dressing was applied.  She was taken to recovery room in stable condition.  All final counts were correct.  No complications  noted.  Of note, Benita Stabile, PA-C, assisted the entire case.  His assistance was crucial for facilitating all aspects of this case.  LN/NUANCE  D:10/01/2018 T:10/01/2018 JOB:[id number]

## 2018-10-01 NOTE — Progress Notes (Signed)
Assisted Dr. Hodierne with right, ultrasound guided, adductor canal block. Side rails up, monitors on throughout procedure. See vital signs in flow sheet. Tolerated Procedure well.  

## 2018-10-01 NOTE — Anesthesia Procedure Notes (Signed)
Date/Time: 10/01/2018 10:48 AM Performed by: Thornell Mule, CRNA Oxygen Delivery Method: Simple face mask

## 2018-10-01 NOTE — Anesthesia Procedure Notes (Signed)
Anesthesia Regional Block: Adductor canal block   Pre-Anesthetic Checklist: ,, timeout performed, Correct Patient, Correct Site, Correct Laterality, Correct Procedure, Correct Position, site marked, Risks and benefits discussed,  Surgical consent,  Pre-op evaluation,  At surgeon's request and post-op pain management  Laterality: Right  Prep: chloraprep       Needles:  Injection technique: Single-shot  Needle Type: Echogenic Needle     Needle Length: 9cm  Needle Gauge: 21     Additional Needles:   Narrative:  Start time: 10/01/2018 9:58 AM End time: 10/01/2018 10:06 AM Injection made incrementally with aspirations every 5 mL.  Performed by: Personally  Anesthesiologist: Achille Rich, MD  Additional Notes: Pt tolerated the procedure well.

## 2018-10-01 NOTE — Brief Op Note (Signed)
10/01/2018  12:14 PM  PATIENT:  Bianca Ware  72 y.o. female  PRE-OPERATIVE DIAGNOSIS:  osteoarthritis right knee  POST-OPERATIVE DIAGNOSIS:  osteoarthritis right knee  PROCEDURE:  Procedure(s): RIGHT TOTAL KNEE ARTHROPLASTY (Right)  SURGEON:  Surgeon(s) and Role:    Kathryne Hitch, MD - Primary  PHYSICIAN ASSISTANT: Rexene Edison, PA-C  ANESTHESIA:   regional and spinal  EBL:  50 mL   COUNTS:  YES  TOURNIQUET:   Total Tourniquet Time Documented: Thigh (Right) - 42 minutes Total: Thigh (Right) - 42 minutes   DICTATION: .Other Dictation: Dictation Number [id number]  PLAN OF CARE: Admit to inpatient   PATIENT DISPOSITION:  PACU - hemodynamically stable.   Delay start of Pharmacological VTE agent (>24hrs) due to surgical blood loss or risk of bleeding: no

## 2018-10-01 NOTE — H&P (Signed)
TOTAL KNEE ADMISSION H&P  Patient is being admitted for right total knee arthroplasty.  Subjective:  Chief Complaint:right knee pain.  HPI: Bianca Ware, 72 y.o. female, has a history of pain and functional disability in the right knee due to arthritis and has failed non-surgical conservative treatments for greater than 12 weeks to includeNSAID's and/or analgesics, corticosteriod injections, viscosupplementation injections, flexibility and strengthening excercises and activity modification.  Onset of symptoms was gradual, starting 3 years ago with gradually worsening course since that time. The patient noted no past surgery on the right knee(s).  Patient currently rates pain in the right knee(s) at 10 out of 10 with activity. Patient has night pain, worsening of pain with activity and weight bearing, pain that interferes with activities of daily living, pain with passive range of motion, crepitus and joint swelling.  Patient has evidence of subchondral cysts, subchondral sclerosis, periarticular osteophytes and joint space narrowing by imaging studies. There is no active infection.  Patient Active Problem List   Diagnosis Date Noted  . Chronic pain of left knee 08/11/2018  . Unilateral primary osteoarthritis, left knee 08/11/2018  . Chronic pain of right knee 02/22/2018  . Unilateral primary osteoarthritis, right knee 02/22/2018  . Osteoarthritis of left hip 09/07/2015  . Status post total replacement of left hip 09/07/2015  . Lumbar disc herniation with radiculopathy 05/29/2015    Class: Acute  . Spondylolisthesis of lumbar region 05/29/2015    Class: Chronic   Past Medical History:  Diagnosis Date  . Arthritis    hands  . Complication of anesthesia    waking up  . OA (osteoarthritis) of knee    right and left  . PONV (postoperative nausea and vomiting)   . Seasonal allergies   . Wears glasses     Past Surgical History:  Procedure Laterality Date  . ABDOMINAL HYSTERECTOMY   1970s  . BLADDER SUSPENSION  2002   w/ urethral sling  . CATARACT EXTRACTION W/ INTRAOCULAR LENS IMPLANT Left 2018  . CESAREAN SECTION  x2   yrs ago  . DILATION AND CURETTAGE OF UTERUS  1960s  . LUMBAR LAMINECTOMY N/A 05/29/2015   Procedure: LEFT L4-5 MICRODISCECTOMY;  Surgeon: Kerrin Champagne, MD;  Location: [facility] OR;  Service: Orthopedics;  Laterality: N/A;  . TONSILLECTOMY  child  . TOTAL HIP ARTHROPLASTY Left 09/07/2015   Procedure: LEFT TOTAL HIP ARTHROPLASTY ANTERIOR APPROACH;  Surgeon: Kathryne Hitch, MD;  Location: [facility];  Service: Orthopedics;  Laterality: Left;  . TUBAL LIGATION Bilateral yrs ago    No current facility-administered medications for this encounter.    No Known Allergies  Social History   Tobacco Use  . Smoking status: Former Smoker    Packs/day: 0.25    Years: 15.00    Pack years: 3.75    Types: Cigarettes    Last attempt to quit: 05/24/1977    Years since quitting: 41.3  . Smokeless tobacco: Never Used  Substance Use Topics  . Alcohol use: No    Family History  Problem Relation Age of Onset  . Breast cancer Neg Hx      Review of Systems  Musculoskeletal: Positive for joint pain.  All other systems reviewed and are negative.   Objective:  Physical Exam  Constitutional: She is oriented to person, place, and time. She appears well-developed and well-nourished.  HENT:  Head: Normocephalic and atraumatic.  Eyes: Pupils are equal, round, and reactive to light. EOM are normal.  Neck: Normal range of motion.  Neck supple.  Cardiovascular: Normal rate and regular rhythm.  Respiratory: Effort normal and breath sounds normal.  GI: Soft. Bowel sounds are normal.  Musculoskeletal:       Right knee: She exhibits decreased range of motion, swelling, effusion, abnormal alignment and abnormal meniscus. Tenderness found. Medial joint line and lateral joint line tenderness noted.  Neurological: She is alert and oriented to person, place, and time.  Skin:  Skin is warm and dry.  Psychiatric: She has a normal mood and affect.    Vital signs in last 24 hours:    Labs:   Estimated body mass index is 27.57 kg/m as calculated from the following:   Height as of 09/23/18: 5' 7.25" (1.708 m).   Weight as of 09/23/18: 80.5 kg.   Imaging Review Plain radiographs demonstrate severe degenerative joint disease of the right knee(s). The overall alignment ismild varus. The bone quality appears to be excellent for age and reported activity level.   Preoperative templating of the joint replacement has been completed, documented, and submitted to the Operating Room personnel in order to optimize intra-operative equipment management.    Patient's anticipated LOS is less than 2 midnights, meeting these requirements: - Younger than 34 - Lives within 1 hour of care - Has a competent adult at home to recover with post-op recover - NO history of  - Chronic pain requiring opiods  - Diabetes  - Coronary Artery Disease  - Heart failure  - Heart attack  - Stroke  - DVT/VTE  - Cardiac arrhythmia  - Respiratory Failure/COPD  - Renal failure  - Anemia  - Advanced Liver disease        Assessment/Plan:  End stage arthritis, right knee   The patient history, physical examination, clinical judgment of the provider and imaging studies are consistent with end stage degenerative joint disease of the right knee(s) and total knee arthroplasty is deemed medically necessary. The treatment options including medical management, injection therapy arthroscopy and arthroplasty were discussed at length. The risks and benefits of total knee arthroplasty were presented and reviewed. The risks due to aseptic loosening, infection, stiffness, patella tracking problems, thromboembolic complications and other imponderables were discussed. The patient acknowledged the explanation, agreed to proceed with the plan and consent was signed. Patient is being admitted for  inpatient treatment for surgery, pain control, PT, OT, prophylactic antibiotics, VTE prophylaxis, progressive ambulation and ADL's and discharge planning. The patient is planning to be discharged home with home health services

## 2018-10-01 NOTE — Transfer of Care (Signed)
Immediate Anesthesia Transfer of Care Note  Patient: Bianca Ware  Procedure(s) Performed: RIGHT TOTAL KNEE ARTHROPLASTY (Right Knee)  Patient Location: PACU  Anesthesia Type:MAC and Spinal  Level of Consciousness: awake, alert  and oriented  Airway & Oxygen Therapy: Patient Spontanous Breathing and Patient connected to face mask oxygen  Post-op Assessment: Report given to RN and Post -op Vital signs reviewed and stable  Post vital signs: Reviewed and stable  Last Vitals:  Vitals Value Taken Time  BP 156/69 10/01/2018 12:45 PM  Temp    Pulse 64 10/01/2018 12:46 PM  Resp 16 10/01/2018 12:46 PM  SpO2 100 % 10/01/2018 12:46 PM  Vitals shown include unvalidated device data.  Last Pain:  Vitals:   10/01/18 1014  TempSrc:   PainSc: 0-No pain      Patients Stated Pain Goal: 4 (17/51/02 5852)  Complications: No apparent anesthesia complications

## 2018-10-01 NOTE — Progress Notes (Signed)
PHARMACIST - PHYSICIAN ORDER COMMUNICATION  CONCERNING: P&T Medication Policy on Herbal Medications  DESCRIPTION:  This patient's order for:  Biotin  has been noted.  This product(s) is classified as an "herbal" or natural product. Due to a lack of definitive safety studies or FDA approval, nonstandard manufacturing practices, plus the potential risk of unknown drug-drug interactions while on inpatient medications, the Pharmacy and Therapeutics Committee does not permit the use of "herbal" or natural products of this type within [facility].   ACTION TAKEN: The pharmacy department is unable to verify this order at this time and your patient has been informed of this safety policy. Please reevaluate patient's clinical condition at discharge and address if the herbal or natural product(s) should be resumed at that time.   Dorna Leitz, PharmD, BCPS 10/01/2018 2:53 PM

## 2018-10-02 LAB — CBC
HCT: 36 % (ref 36.0–46.0)
HEMOGLOBIN: 11.4 g/dL — AB (ref 12.0–15.0)
MCH: 30.5 pg (ref 26.0–34.0)
MCHC: 31.7 g/dL (ref 30.0–36.0)
MCV: 96.3 fL (ref 80.0–100.0)
PLATELETS: 195 10*3/uL (ref 150–400)
RBC: 3.74 MIL/uL — AB (ref 3.87–5.11)
RDW: 13.6 % (ref 11.5–15.5)
WBC: 8.5 10*3/uL (ref 4.0–10.5)
nRBC: 0 % (ref 0.0–0.2)

## 2018-10-02 LAB — BASIC METABOLIC PANEL
ANION GAP: 8 (ref 5–15)
BUN: 13 mg/dL (ref 8–23)
CHLORIDE: 98 mmol/L (ref 98–111)
CO2: 27 mmol/L (ref 22–32)
Calcium: 8.3 mg/dL — ABNORMAL LOW (ref 8.9–10.3)
Creatinine, Ser: 0.54 mg/dL (ref 0.44–1.00)
GFR calc Af Amer: >60 mL/min (ref >60)
Glucose, Bld: 128 mg/dL — ABNORMAL HIGH (ref 70–99)
POTASSIUM: 3.6 mmol/L (ref 3.5–5.1)
SODIUM: 133 mmol/L — AB (ref 135–145)

## 2018-10-02 MED ORDER — METHOCARBAMOL 500 MG PO TABS: 500.0000 mg | ORAL_TABLET | Freq: Four times a day (QID) | ORAL | 0 refills | Status: DC | PRN

## 2018-10-02 MED ORDER — GABAPENTIN 100 MG PO CAPS: 100.0000 mg | ORAL_CAPSULE | Freq: Three times a day (TID) | ORAL | 0 refills | Status: DC

## 2018-10-02 MED ORDER — GABAPENTIN 100 MG PO CAPS
100.0000 mg | ORAL_CAPSULE | Freq: Three times a day (TID) | ORAL | Status: DC
  Administered 2018-10-02 – 2018-10-03 (×3): 100 mg via ORAL
  Filled 2018-10-02 (×3): qty 1

## 2018-10-02 MED ORDER — OXYCODONE HCL 5 MG PO TABS: 5.0000 mg | ORAL_TABLET | ORAL | 0 refills | Status: DC | PRN

## 2018-10-02 MED ORDER — ASPIRIN 325 MG PO TBEC: 325.0000 mg | DELAYED_RELEASE_TABLET | Freq: Two times a day (BID) | ORAL | 0 refills | Status: DC

## 2018-10-02 MED ORDER — SCOPOLAMINE 1 MG/3DAYS TD PT72
1.0000 | MEDICATED_PATCH | TRANSDERMAL | Status: DC
  Administered 2018-10-02: 1.5 mg via TRANSDERMAL
  Filled 2018-10-02: qty 1

## 2018-10-02 NOTE — Progress Notes (Signed)
     Subjective: 1 Day Post-Op Procedure(s) (LRB): RIGHT TOTAL KNEE ARTHROPLASTY (Right) Awake, alert and oriented x 4. Nausea, vertigo post op. History of vertigo in the past. May be anesthesia related, no aminoglycoside antibiotics.  Patient reports pain as moderate.    Objective:   VITALS:  Temp:  [97.7 F (36.5 C)-98.5 F (36.9 C)] 97.7 F (36.5 C) (11/09 0915) Pulse Rate:  [52-71] 65 (11/09 0915) Resp:  [11-19] 18 (11/09 0915) BP: (131-163)/(55-74) 148/60 (11/09 0915) SpO2:  [96 %-100 %] 96 % (11/09 0915)  Neurologically intact ABD soft Neurovascular intact Sensation intact distally Intact pulses distally Dorsiflexion/Plantar flexion intact Incision: no drainage and ACE wrap in place, will remove tomorrow. Foley should be discontinued. Compartment soft   LABS Recent Labs    10/02/18 0413  HGB 11.4*  WBC 8.5  PLT 195   Recent Labs    10/02/18 0413  NA 133*  K 3.6  CL 98  CO2 27  BUN 13  CREATININE 0.54  GLUCOSE 128*   No results for input(s): LABPT, INR in the last 72 hours.   Assessment/Plan: 1 Day Post-Op Procedure(s) (LRB): RIGHT TOTAL KNEE ARTHROPLASTY (Right)  Vertigo   Advance diet Up with therapy D/C IV fluids  Scope patch and reassess vertigo, antivert or dramamine are a consideraiton. D/C foley.   Vira Browns 10/02/2018, 10:38 AMPatient ID: Bianca Ware, female   DOB: [birth year], 72 y.o.   MRN: [id number]

## 2018-10-02 NOTE — Progress Notes (Signed)
Physical Therapy Treatment Patient Details Name: Bianca Ware MRN: [id number] DOB: 04-09-1946 Today's Date: 10/02/2018    History of Present Illness Pt s/p R TKR and with hx of L TKR and lumbar laminectomy    PT Comments    Pt cooperative but progressing slowly with mobility 2* dizziness/fatigue with attempts to amulate.   Follow Up Recommendations  Follow surgeon's recommendation for DC plan and follow-up therapies     Equipment Recommendations  None recommended by PT    Recommendations for Other Services       Precautions / Restrictions Precautions Precautions: Fall Restrictions Weight Bearing Restrictions: No    Mobility  Bed Mobility Overal bed mobility: Needs Assistance Bed Mobility: Supine to Sit     Supine to sit: Min assist     General bed mobility comments: cues for sequence and use of L LE to self assist  Transfers Overall transfer level: Needs assistance   Transfers: Stand Pivot Transfers;Sit to/from Stand Sit to Stand: Min assist;From elevated surface Stand pivot transfers: Min assist       General transfer comment: cues for LE management and use of UEs to self assist: stand pvt bed to [facility]  Ambulation/Gait Ambulation/Gait assistance: Min assist;+2 safety/equipment Gait Distance (Feet): 35 Feet Assistive device: Rolling walker (2 wheeled) Gait Pattern/deviations: Step-to pattern;Decreased step length - right;Decreased step length - left;Shuffle;Trunk flexed Gait velocity: decr   General Gait Details: cues for sequence, posture and position from RW; Distance ltd by onset dizziness   Stairs             Wheelchair Mobility    Modified Rankin (Stroke Patients Only)       Balance Overall balance assessment: Mild deficits observed, not formally tested                                          Cognition Arousal/Alertness: Awake/alert Behavior During Therapy: WFL for tasks assessed/performed Overall Cognitive  Status: Within Functional Limits for tasks assessed                                        Exercises      General Comments        Pertinent Vitals/Pain Pain Assessment: 0-10 Pain Score: 5  Pain Location: R knee Pain Descriptors / Indicators: Aching;Sore Pain Intervention(s): Limited activity within patient's tolerance;Monitored during session;Premedicated before session;Ice applied    Home Living                      Prior Function            PT Goals (current goals can now be found in the care plan section) Acute Rehab PT Goals Patient Stated Goal: Regain IND PT Goal Formulation: With patient Time For Goal Achievement: 10/08/18 Potential to Achieve Goals: Good Progress towards PT goals: Progressing toward goals    Frequency    7X/week      PT Plan Current plan remains appropriate    Co-evaluation              AM-PAC PT "6 Clicks" Daily Activity  Outcome Measure  Difficulty turning over in bed (including adjusting bedclothes, sheets and blankets)?: Unable Difficulty moving from lying on back to sitting on the side of the bed? : Unable Difficulty  sitting down on and standing up from a chair with arms (e.g., wheelchair, bedside commode, etc,.)?: Unable Help needed moving to and from a bed to chair (including a wheelchair)?: A Little Help needed walking in hospital room?: A Little Help needed climbing 3-5 steps with a railing? : A Lot 6 Click Score: 11    End of Session Equipment Utilized During Treatment: Gait belt Activity Tolerance: Patient tolerated treatment well Patient left: in chair;with call bell/phone within reach;with family/visitor present Nurse Communication: Mobility status PT Visit Diagnosis: Difficulty in walking, not elsewhere classified (R26.2)     Time: 4098-1191 PT Time Calculation (min) (ACUTE ONLY): 23 min  Charges:  $Gait Training: 8-22 mins $Therapeutic Activity: 8-22 mins                      Mauro Kaufmann PT Acute Rehabilitation Services Pager [phone] Office [phone]    Kanden Carey 10/02/2018, 1:18 PM

## 2018-10-02 NOTE — Plan of Care (Signed)
Pt stable though remains dizzy with nausea. Md aware of this finding with rounding.

## 2018-10-02 NOTE — Evaluation (Signed)
Occupational Therapy Evaluation Patient Details Name: Bianca Ware MRN: [id number] DOB: 02-23-1946 Today's Date: 10/02/2018    History of Present Illness Pt s/p R TKR and with hx of L TKR and lumbar laminectomy   Clinical Impression   Pt practiced up to 3in1 with walker. Reported very slight dizziness with exiting bathroom but able to continue with session. Family present for session. Pt will benefit from continued OT to progress ADL independence to return home.     Follow Up Recommendations  No OT follow up;Supervision/Assistance - 24 hour    Equipment Recommendations  None recommended by OT    Recommendations for Other Services       Precautions / Restrictions Precautions Precautions: Fall Restrictions Weight Bearing Restrictions: No      Mobility Bed Mobility Overal bed mobility: Needs Assistance Bed Mobility: Sit to Supine     Supine to sit: Min assist     General bed mobility comments: for R LE onto bed  Transfers Overall transfer level: Needs assistance Equipment used: Rolling walker (2 wheeled) Transfers: Sit to/from Stand Sit to Stand: Min assist Stand pivot transfers: Min assist       General transfer comment: min cues for hand placemnet and LE management.     Balance Overall balance assessment: Mild deficits observed, not formally tested                                         ADL either performed or assessed with clinical judgement   ADL Overall ADL's : Needs assistance/impaired Eating/Feeding: Independent;Sitting   Grooming: Wash/dry hands;Standing;Min guard   Upper Body Bathing: Set up;Sitting   Lower Body Bathing: Moderate assistance;Sit to/from stand   Upper Body Dressing : Set up;Sitting   Lower Body Dressing: Moderate assistance;Sit to/from stand   Toilet Transfer: Minimal assistance;+2 for safety/equipment;Ambulation;RW;BSC   Toileting- Clothing Manipulation and Hygiene: Minimal assistance;Sit to/from  stand         General ADL Comments: Educated on AE options but daughter states between her and spouse someone will be available to assist. Pt needed min assist and min cues for proper walker sequence and hand placement with functional transfers. Educated on 3in1 and use as a shower chair. Pt reporting just slight dizziness with exiting bathroom but able to continue session. Family present for session.      Vision Patient Visual Report: No change from baseline       Perception     Praxis      Pertinent Vitals/Pain Pain Assessment: 0-10 Pain Score: 2  Pain Location: R knee Pain Descriptors / Indicators: Aching;Sore Pain Intervention(s): Monitored during session     Hand Dominance     Extremity/Trunk Assessment Upper Extremity Assessment Upper Extremity Assessment: Overall WFL for tasks assessed           Communication Communication Communication: No difficulties   Cognition Arousal/Alertness: Awake/alert Behavior During Therapy: WFL for tasks assessed/performed Overall Cognitive Status: Within Functional Limits for tasks assessed                                     General Comments       Exercises     Shoulder Instructions      Home Living Family/patient expects to be discharged to:: Private residence Living Arrangements: Spouse/significant other Available Help at Discharge:  Family Type of Home: House Home Access: Stairs to enter [employer] of Steps: 2+1  Entrance Stairs-Rails: Right;Left;Can reach both Home Layout: One level     Bathroom Shower/Tub: Producer, television/film/video: Handicapped height     Home Equipment: Environmental consultant - 2 wheels;Cane - single point;Bedside commode          Prior Functioning/Environment Level of Independence: Independent                 OT Problem List: Decreased strength;Decreased activity tolerance;Decreased knowledge of use of DME or AE      OT Treatment/Interventions:  Self-care/ADL training;DME and/or AE instruction;Therapeutic activities;Patient/family education    OT Goals(Current goals can be found in the care plan section) Acute Rehab OT Goals Patient Stated Goal: return to independence OT Goal Formulation: With patient/family Time For Goal Achievement: 10/09/18 Potential to Achieve Goals: Good  OT Frequency: Min 2X/week   Barriers to D/C:            Co-evaluation              AM-PAC PT "6 Clicks" Daily Activity     Outcome Measure Help from another person eating meals?: None Help from another person taking care of personal grooming?: A Little Help from another person toileting, which includes using toliet, bedpan, or urinal?: A Little Help from another person bathing (including washing, rinsing, drying)?: A Little Help from another person to put on and taking off regular upper body clothing?: None Help from another person to put on and taking off regular lower body clothing?: A Lot 6 Click Score: 19   End of Session Equipment Utilized During Treatment: Gait belt;Right knee immobilizer  Activity Tolerance: Patient tolerated treatment well Patient left: in bed;with call bell/phone within reach  OT Visit Diagnosis: Unsteadiness on feet (R26.81);Muscle weakness (generalized) (M62.81)                Time: 1610-9604 OT Time Calculation (min): 28 min Charges:  OT General Charges $OT Visit: 1 Visit OT Evaluation $OT Eval Low Complexity: 1 Low OT Treatments $Therapeutic Activity: 8-22 mins    Judithann Sauger OTR/L Acute Rehabilitation [phone] office number    10/02/2018, 2:41 PM

## 2018-10-02 NOTE — Progress Notes (Signed)
Physical Therapy Treatment Patient Details Name: Bianca Ware MRN: [id number] DOB: 1946/11/02 Today's Date: 10/02/2018    History of Present Illness Pt s/p R TKR and with hx of L TKR and lumbar laminectomy    PT Comments    Steady progress with mobility and no c/o dizziness this pm.   Follow Up Recommendations  Follow surgeon's recommendation for DC plan and follow-up therapies     Equipment Recommendations  None recommended by PT    Recommendations for Other Services       Precautions / Restrictions Precautions Precautions: Fall Restrictions Weight Bearing Restrictions: No Other Position/Activity Restrictions: WBAT    Mobility  Bed Mobility Overal bed mobility: Needs Assistance Bed Mobility: Supine to Sit;Sit to Supine     Supine to sit: Min assist Sit to supine: Min assist   General bed mobility comments: cues for sequence with assist for R LE   Transfers Overall transfer level: Needs assistance Equipment used: Rolling walker (2 wheeled) Transfers: Sit to/from Stand Sit to Stand: Min assist Stand pivot transfers: Min assist       General transfer comment: min cues for hand placemnet and LE management.   Ambulation/Gait Ambulation/Gait assistance: Min assist Gait Distance (Feet): 84 Feet(and 15' back from bathroom) Assistive device: Rolling walker (2 wheeled) Gait Pattern/deviations: Step-to pattern;Decreased step length - right;Decreased step length - left;Shuffle;Trunk flexed Gait velocity: decr   General Gait Details: cues for sequence, posture and position from RW;   Optometrist    Modified Rankin (Stroke Patients Only)       Balance Overall balance assessment: Mild deficits observed, not formally tested                                          Cognition Arousal/Alertness: Awake/alert Behavior During Therapy: WFL for tasks assessed/performed Overall Cognitive Status: Within  Functional Limits for tasks assessed                                        Exercises Total Joint Exercises Ankle Circles/Pumps: AROM;Both;15 reps;Supine Quad Sets: AROM;Both;10 reps;Supine Heel Slides: AAROM;Right;15 reps;Supine Straight Leg Raises: AAROM;Right;10 reps;Supine Goniometric ROM: AAROM R knee -10 - 45    General Comments        Pertinent Vitals/Pain Pain Assessment: 0-10 Pain Score: 5  Pain Location: R knee Pain Descriptors / Indicators: Aching;Sore Pain Intervention(s): Limited activity within patient's tolerance;Monitored during session;Premedicated before session;Ice applied    Home Living Family/patient expects to be discharged to:: Private residence Living Arrangements: Spouse/significant other Available Help at Discharge: Family Type of Home: House Home Access: Stairs to enter Entrance Stairs-Rails: Right;Left;Can reach both Home Layout: One level Home Equipment: Environmental consultant - 2 wheels;Cane - single point;Bedside commode      Prior Function Level of Independence: Independent          PT Goals (current goals can now be found in the care plan section) Acute Rehab PT Goals Patient Stated Goal: return to independence PT Goal Formulation: With patient Time For Goal Achievement: 10/08/18 Potential to Achieve Goals: Good Progress towards PT goals: Progressing toward goals    Frequency    7X/week      PT Plan Current plan remains appropriate  Co-evaluation              AM-PAC PT "6 Clicks" Daily Activity  Outcome Measure  Difficulty turning over in bed (including adjusting bedclothes, sheets and blankets)?: Unable Difficulty moving from lying on back to sitting on the side of the bed? : Unable Difficulty sitting down on and standing up from a chair with arms (e.g., wheelchair, bedside commode, etc,.)?: Unable Help needed moving to and from a bed to chair (including a wheelchair)?: A Little Help needed walking in hospital  room?: A Little Help needed climbing 3-5 steps with a railing? : A Lot 6 Click Score: 11    End of Session Equipment Utilized During Treatment: Gait belt Activity Tolerance: Patient tolerated treatment well Patient left: in bed;with call bell/phone within reach;with family/visitor present Nurse Communication: Mobility status PT Visit Diagnosis: Difficulty in walking, not elsewhere classified (R26.2)     Time: 1610-9604 PT Time Calculation (min) (ACUTE ONLY): 39 min  Charges:  $Gait Training: 8-22 mins $Therapeutic Exercise: 8-22 mins $Therapeutic Activity: 8-22 mins                     Mauro Kaufmann PT Acute Rehabilitation Services Pager [phone] Office [phone]    Bianca Ware 10/02/2018, 4:26 PM

## 2018-10-02 NOTE — Plan of Care (Signed)
Plan of care reviewed and discussed with patient.   

## 2018-10-02 NOTE — Discharge Instructions (Signed)

## 2018-10-03 NOTE — Progress Notes (Signed)
     Subjective: 2 Days Post-Op Procedure(s) (LRB): RIGHT TOTAL KNEE ARTHROPLASTY (Right) Awake, alert and oriented, PT today, HHN nursing for continued home exercises. Patient reports pain as moderate.    Objective:   VITALS:  Temp:  [98.7 F (37.1 C)-99.8 F (37.7 C)] 99.8 F (37.7 C) (11/10 0538) Pulse Rate:  [70-75] 71 (11/10 0538) Resp:  [16] 16 (11/10 0538) BP: (131-153)/(52-53) 145/52 (11/10 0538) SpO2:  [94 %-97 %] 94 % (11/10 0538)  Neurologically intact ABD soft Neurovascular intact Sensation intact distally Intact pulses distally Dorsiflexion/Plantar flexion intact Incision: no drainage Compartment soft   LABS Recent Labs    10/02/18 0413  HGB 11.4*  WBC 8.5  PLT 195   Recent Labs    10/02/18 0413  NA 133*  K 3.6  CL 98  CO2 27  BUN 13  CREATININE 0.54  GLUCOSE 128*   No results for input(s): LABPT, INR in the last 72 hours.   Assessment/Plan: 2 Days Post-Op Procedure(s) (LRB): RIGHT TOTAL KNEE ARTHROPLASTY (Right)  Advance diet Discharge home with home health  Vira Browns 10/03/2018, 11:21 AMPatient ID: Bianca Ware, female   DOB: [birth year], 72 y.o.   MRN: [id number]

## 2018-10-03 NOTE — Care Management Note (Signed)
Case Management Note  Patient Details  Name: Bianca Ware MRN: [id number] Date of Birth: 1946-06-19  Subjective/Objective:   S/p R TKR                 Action/Plan: NCM spoke to pt and hsuband at home to assist with care. States she has RW and 3n1 bedside commode at home. Offered choice for [facility]. Pt agreeable to [facility] for [facility] (preoperatively arranged from surgeon's office).   Expected Discharge Date:  10/03/18               Expected Discharge Plan:  Home w Home Health Services  In-House Referral:  NA  Discharge planning Services  CM Consult  Post Acute Care Choice:  Home Health Choice offered to:  Patient  DME Arranged:  N/A DME Agency:  NA  HH Arranged:  PT HH Agency:  [facility]  Status of Service:  Completed, signed off  If discussed at [employer], dates discussed:    Additional Comments:  Elliot Cousin, RN 10/03/2018, 9:23 AM

## 2018-10-03 NOTE — Progress Notes (Signed)
Physical Therapy Treatment Patient Details Name: Bianca Ware MRN: [id number] DOB: 1946/06/10 Today's Date: 10/03/2018    History of Present Illness Pt s/p R TKR and with hx of L TKR and lumbar laminectomy    PT Comments    Pt progressing well with mobility and eager for dc home.  Pt and spouse reviewed therex program, stairs and don/doff KI.   Follow Up Recommendations  Follow surgeon's recommendation for DC plan and follow-up therapies     Equipment Recommendations  None recommended by PT    Recommendations for Other Services       Precautions / Restrictions Precautions Precautions: Fall Restrictions Weight Bearing Restrictions: No Other Position/Activity Restrictions: WBAT    Mobility  Bed Mobility Overal bed mobility: Needs Assistance Bed Mobility: Supine to Sit;Sit to Supine     Supine to sit: Min guard Sit to supine: Min assist   General bed mobility comments: Pt sitting EOB upon arrival  Transfers Overall transfer level: Needs assistance Equipment used: Rolling walker (2 wheeled) Transfers: Sit to/from Stand Sit to Stand: Supervision         General transfer comment: min cues for hand placemnet and LE management.   Ambulation/Gait Ambulation/Gait assistance: Min guard Gait Distance (Feet): 100 Feet Assistive device: Rolling walker (2 wheeled) Gait Pattern/deviations: Step-to pattern;Decreased step length - right;Decreased step length - left;Shuffle;Trunk flexed Gait velocity: decr   General Gait Details: cues for sequence, posture and position from RW;   Stairs Stairs: Yes Stairs assistance: Min assist Stair Management: No rails;Two rails;Step to pattern;Forwards;Backwards;With walker Number of Stairs: 7 General stair comments: single step fwd and bkwd with RW and spouse assisting.  5 stairs fwd with bilat rails and spouse assisting.  Cues for sequence and foot/RW placement.  Written instructioin provided   Wheelchair Mobility     Modified Rankin (Stroke Patients Only)       Balance Overall balance assessment: Mild deficits observed, not formally tested                                          Cognition Arousal/Alertness: Awake/alert Behavior During Therapy: WFL for tasks assessed/performed Overall Cognitive Status: Within Functional Limits for tasks assessed                                        Exercises Total Joint Exercises Ankle Circles/Pumps: AROM;Both;15 reps;Supine Quad Sets: AROM;Both;Supine;15 reps Heel Slides: AAROM;Right;Supine;20 reps Straight Leg Raises: AAROM;Right;Supine;20 reps    General Comments        Pertinent Vitals/Pain Pain Assessment: 0-10 Pain Score: 5  Pain Location: R knee Pain Descriptors / Indicators: Aching;Sore Pain Intervention(s): Limited activity within patient's tolerance;Monitored during session    Home Living                      Prior Function            PT Goals (current goals can now be found in the care plan section) Acute Rehab PT Goals Patient Stated Goal: return to independence PT Goal Formulation: With patient Time For Goal Achievement: 10/08/18 Potential to Achieve Goals: Good Progress towards PT goals: Progressing toward goals    Frequency    7X/week      PT Plan Current plan remains appropriate    Co-evaluation  AM-PAC PT "6 Clicks" Daily Activity  Outcome Measure  Difficulty turning over in bed (including adjusting bedclothes, sheets and blankets)?: Unable Difficulty moving from lying on back to sitting on the side of the bed? : A Lot Difficulty sitting down on and standing up from a chair with arms (e.g., wheelchair, bedside commode, etc,.)?: A Lot Help needed moving to and from a bed to chair (including a wheelchair)?: A Little Help needed walking in hospital room?: A Little Help needed climbing 3-5 steps with a railing? : A Little 6 Click Score: 14    End  of Session Equipment Utilized During Treatment: Gait belt;Right knee immobilizer Activity Tolerance: Patient tolerated treatment well Patient left: in bed;with call bell/phone within reach;with family/visitor present Nurse Communication: Mobility status PT Visit Diagnosis: Difficulty in walking, not elsewhere classified (R26.2)     Time: 1610-9604 PT Time Calculation (min) (ACUTE ONLY): 53 min  Charges:  $Gait Training: 8-22 mins $Therapeutic Exercise: 8-22 mins $Therapeutic Activity: 8-22 mins                     Mauro Kaufmann PT Acute Rehabilitation Services Pager [phone] Office [phone]    Caidence Kaseman 10/03/2018, 12:30 PM

## 2018-10-03 NOTE — Plan of Care (Signed)
Pt to d/c home in stable condition with family. No needs at time of d/c. Pt has all home equipment needed.

## 2018-10-03 NOTE — Progress Notes (Signed)
Occupational Therapy Treatment and Discharge Patient Details Name: Bianca Ware MRN: [id number] DOB: [birth year] Today's Date: 10/03/2018    History of present illness Pt s/p R TKR and with hx of L TKR and lumbar laminectomy   OT comments  This 72 yo female admitted and underwent above presents to acute OT with all education about basic ADLs completed with pt and husband. Pt to D/C today so will D/C from acute OT.  Follow Up Recommendations  No OT follow up;Supervision/Assistance - 24 hour    Equipment Recommendations  None recommended by OT       Precautions / Restrictions Precautions Precautions: Fall Restrictions Weight Bearing Restrictions: No Other Position/Activity Restrictions: WBAT       Mobility Bed Mobility   General bed mobility comments: Pt sitting EOB upon arrival  Transfers Overall transfer level: Needs assistance Equipment used: Rolling walker (2 wheeled) Transfers: Sit to/from Stand Sit to Stand: Supervision         General transfer comment: (P) min cues for hand placemnet and LE management.         ADL either performed or assessed with clinical judgement   ADL Overall ADL's : Needs assistance/impaired                       Lower Body Dressing Details (indicate cue type and reason): Pt and husband aware that with underwear and pants to put RLE in first when dressing and out last when undressing Toilet Transfer: Supervision/safety;Ambulation;RW;BSC       Tub/ Shower Transfer: Walk-in shower;Min guard;Rolling walker;3 in 1 Tub/Shower Transfer Details (indicate cue type and reason): VCs to step in with "good" out with "bad"   General ADL Comments: husband to A with LB ADLs until patient can do them by herself     Vision Patient Visual Report: No change from baseline            Cognition Arousal/Alertness: Awake/alert Behavior During Therapy: WFL for tasks assessed/performed Overall Cognitive Status: Within Functional  Limits for tasks assessed                                                     Pertinent Vitals/ Pain       Pain Assessment: 0-10 Pain Score: 5  Pain Location: R knee Pain Descriptors / Indicators: Aching;Sore Pain Intervention(s): Limited activity within patient's tolerance;Monitored during session         Frequency  Min 2X/week        Progress Toward Goals  OT Goals(current goals can now be found in the care plan section)  Progress towards OT goals: (all education completed with pt and husband)  Acute Rehab OT Goals Patient Stated Goal: (P) return to independence  Plan Discharge plan remains appropriate       AM-PAC PT "6 Clicks" Daily Activity     Outcome Measure   Help from another person eating meals?: None Help from another person taking care of personal grooming?: A Little Help from another person toileting, which includes using toliet, bedpan, or urinal?: A Little Help from another person bathing (including washing, rinsing, drying)?: A Little Help from another person to put on and taking off regular upper body clothing?: None Help from another person to put on and taking off regular lower body clothing?: A Lot 6 Click Score:  19    End of Session    OT Visit Diagnosis: Unsteadiness on feet (R26.81);Muscle weakness (generalized) (M62.81);Pain Pain - Right/Left: Right Pain - part of body: Knee   Activity Tolerance Patient tolerated treatment well   Patient Left (sitting EOB)   Nurse Communication (pt ready to be D/C'd)        Time: 1209-1219 OT Time Calculation (min): 10 min  Charges: OT General Charges $OT Visit: 1 Visit OT Treatments $Self Care/Home Management : 8-22 mins  Ignacia Palma, OTR/L Acute Rehab Services Pager [phone] Office [phone]     Evette Georges 10/03/2018, 12:29 PM

## 2018-10-03 NOTE — Plan of Care (Signed)
  Problem: Clinical Measurements: Goal: Ability to maintain clinical measurements within normal limits will improve Outcome: Progressing Goal: Will remain free from infection Outcome: Progressing Goal: Diagnostic test results will improve Outcome: Progressing   Problem: Activity: Goal: Risk for activity intolerance will decrease Outcome: Progressing   Problem: Elimination: Goal: Will not experience complications related to bowel motility Outcome: Progressing   Problem: Safety: Goal: Ability to remain free from injury will improve Outcome: Progressing   Problem: Skin Integrity: Goal: Risk for impaired skin integrity will decrease Outcome: Progressing   Problem: Education: Goal: Individualized Educational Video(s) Outcome: Progressing   Problem: Clinical Measurements: Goal: Postoperative complications will be avoided or minimized Outcome: Progressing   Problem: Skin Integrity: Goal: Will show signs of wound healing Outcome: Progressing

## 2018-10-04 ENCOUNTER — Encounter (HOSPITAL_COMMUNITY): Payer: Self-pay | Admitting: Orthopaedic Surgery

## 2018-10-04 DIAGNOSIS — Z471 Aftercare following joint replacement surgery: Secondary | ICD-10-CM | POA: Diagnosis not present

## 2018-10-04 DIAGNOSIS — M4316 Spondylolisthesis, lumbar region: Secondary | ICD-10-CM | POA: Diagnosis not present

## 2018-10-04 DIAGNOSIS — M5116 Intervertebral disc disorders with radiculopathy, lumbar region: Secondary | ICD-10-CM | POA: Diagnosis not present

## 2018-10-04 DIAGNOSIS — Z87891 Personal history of nicotine dependence: Secondary | ICD-10-CM | POA: Diagnosis not present

## 2018-10-04 DIAGNOSIS — Z9181 History of falling: Secondary | ICD-10-CM | POA: Diagnosis not present

## 2018-10-04 DIAGNOSIS — Z96642 Presence of left artificial hip joint: Secondary | ICD-10-CM | POA: Diagnosis not present

## 2018-10-04 DIAGNOSIS — Z96653 Presence of artificial knee joint, bilateral: Secondary | ICD-10-CM | POA: Diagnosis not present

## 2018-10-04 DIAGNOSIS — Z7982 Long term (current) use of aspirin: Secondary | ICD-10-CM | POA: Diagnosis not present

## 2018-10-06 DIAGNOSIS — M5116 Intervertebral disc disorders with radiculopathy, lumbar region: Secondary | ICD-10-CM | POA: Diagnosis not present

## 2018-10-06 DIAGNOSIS — Z471 Aftercare following joint replacement surgery: Secondary | ICD-10-CM | POA: Diagnosis not present

## 2018-10-06 DIAGNOSIS — Z96642 Presence of left artificial hip joint: Secondary | ICD-10-CM | POA: Diagnosis not present

## 2018-10-06 DIAGNOSIS — Z7982 Long term (current) use of aspirin: Secondary | ICD-10-CM | POA: Diagnosis not present

## 2018-10-06 DIAGNOSIS — Z96653 Presence of artificial knee joint, bilateral: Secondary | ICD-10-CM | POA: Diagnosis not present

## 2018-10-06 DIAGNOSIS — M4316 Spondylolisthesis, lumbar region: Secondary | ICD-10-CM | POA: Diagnosis not present

## 2018-10-08 DIAGNOSIS — Z7982 Long term (current) use of aspirin: Secondary | ICD-10-CM | POA: Diagnosis not present

## 2018-10-08 DIAGNOSIS — Z471 Aftercare following joint replacement surgery: Secondary | ICD-10-CM | POA: Diagnosis not present

## 2018-10-08 DIAGNOSIS — M5116 Intervertebral disc disorders with radiculopathy, lumbar region: Secondary | ICD-10-CM | POA: Diagnosis not present

## 2018-10-08 DIAGNOSIS — M4316 Spondylolisthesis, lumbar region: Secondary | ICD-10-CM | POA: Diagnosis not present

## 2018-10-08 DIAGNOSIS — Z96642 Presence of left artificial hip joint: Secondary | ICD-10-CM | POA: Diagnosis not present

## 2018-10-08 DIAGNOSIS — Z96653 Presence of artificial knee joint, bilateral: Secondary | ICD-10-CM | POA: Diagnosis not present

## 2018-10-11 NOTE — Discharge Summary (Signed)
Patient ID: Bianca Ware MRN: [id number] DOB/AGE: [birth year] 72 y.o.  Admit date: 10/01/2018 Discharge date: 10/11/2018  Admission Diagnoses:  Principal Problem:   Unilateral primary osteoarthritis, right knee Active Problems:   Status post total knee replacement, right   Discharge Diagnoses:  Same  Past Medical History:  Diagnosis Date  . Arthritis    hands  . Complication of anesthesia    waking up  . OA (osteoarthritis) of knee    right and left  . PONV (postoperative nausea and vomiting)   . Seasonal allergies   . Wears glasses     Surgeries: Procedure(s): RIGHT TOTAL KNEE ARTHROPLASTY on 10/01/2018   Consultants:   Discharged Condition: Improved  Hospital Course: Bianca Ware is an 72 y.o. female who was admitted 10/01/2018 for operative treatment ofUnilateral primary osteoarthritis, right knee. Patient has severe unremitting pain that affects sleep, daily activities, and work/hobbies. After pre-op clearance the patient was taken to the operating room on 10/01/2018 and underwent  Procedure(s): RIGHT TOTAL KNEE ARTHROPLASTY.    Patient was given perioperative antibiotics:  Anti-infectives (From admission, onward)   Start     Dose/Rate Route Frequency Ordered Stop   10/01/18 1700  ceFAZolin (ANCEF) IVPB 1 g/50 mL premix     1 g 100 mL/hr over 30 Minutes Intravenous Every 6 hours 10/01/18 1437 10/01/18 2238   10/01/18 0900  ceFAZolin (ANCEF) IVPB 2g/100 mL premix     2 g 200 mL/hr over 30 Minutes Intravenous On call to O.R. 10/01/18 0855 10/01/18 1127       Patient was given sequential compression devices, early ambulation, and chemoprophylaxis to prevent DVT.  Patient benefited maximally from hospital stay and there were no complications.    Recent vital signs: No data found.   Recent laboratory studies: No results for input(s): WBC, HGB, HCT, PLT, NA, K, CL, CO2, BUN, CREATININE, GLUCOSE, INR, CALCIUM in the last 72 hours.  Invalid input(s): PT,  2   Discharge Medications:   Allergies as of 10/03/2018   No Known Allergies     Medication List    TAKE these medications   acetaminophen 650 MG CR tablet Commonly known as:  TYLENOL Take 1,300 mg by mouth at bedtime.   aspirin 325 MG EC tablet Take 1 tablet (325 mg total) by mouth 2 (two) times daily after a meal.   Biotin 5000 MCG Caps Take 5,000 mcg by mouth 2 (two) times daily.   CALCIUM + D PO Take 2 tablets by mouth every morning. Calcium 1200 mg, vitamin d 1000 units   cetirizine 10 MG tablet Commonly known as:  ZYRTEC Take 10 mg by mouth at bedtime.   gabapentin 100 MG capsule Commonly known as:  NEURONTIN Take 1 capsule (100 mg total) by mouth 3 (three) times daily.   GLUCOSAMINE CHOND COMPLEX/MSM PO Take 1 tablet by mouth 3 (three) times daily.   Melatonin 3 MG Tabs Take 12 mg by mouth at bedtime.   methocarbamol 500 MG tablet Commonly known as:  ROBAXIN Take 1 tablet (500 mg total) by mouth every 6 (six) hours as needed for muscle spasms.   Omega-3 1000 MG Caps Take 1,000 mg by mouth 3 (three) times daily.   oxyCODONE 5 MG immediate release tablet Commonly known as:  Oxy IR/ROXICODONE Take 1-2 tablets (5-10 mg total) by mouth every 4 (four) hours as needed for moderate pain (pain score 4-6).   rosuvastatin 40 MG tablet Commonly known as:  CRESTOR Take 40 mg by  mouth every evening.   vitamin C 500 MG tablet Commonly known as:  ASCORBIC ACID Take 500 mg by mouth every morning.   Vitamin D 50 MCG (2000 UT) tablet Take 2,000 Units by mouth daily.       Diagnostic Studies: Dg Knee Right Port  Result Date: 10/01/2018 CLINICAL DATA:  Right knee replacement. EXAM: PORTABLE RIGHT KNEE - 1-2 VIEW COMPARISON:  None. FINDINGS: The right knee demonstrates a total knee arthroplasty without evidence of hardware failure or complication. There is expected intra-articular air. There is no fracture or dislocation. The alignment is anatomic. Post-surgical  changes noted in the surrounding soft tissues. IMPRESSION: Right total knee arthroplasty without acute postoperative complication. Electronically Signed   By: Obie Dredge M.D.   On: 10/01/2018 13:54    Disposition:   Discharge Instructions    Call MD / Call 911   Complete by:  As directed    If you experience chest pain or shortness of breath, CALL 911 and be transported to the hospital emergency room.  If you develope a fever above 101 F, pus (white drainage) or increased drainage or redness at the wound, or calf pain, call your surgeon's office.   Constipation Prevention   Complete by:  As directed    Drink plenty of fluids.  Prune juice may be helpful.  You may use a stool softener, such as Colace (over the counter) 100 mg twice a day.  Use MiraLax (over the counter) for constipation as needed.   Diet - low sodium heart healthy   Complete by:  As directed    Discharge instructions   Complete by:  As directed    Keep knee incision dry for 5 days post op then may wet while bathing. Therapy daily and CPM goal full extension and greater than 90 degrees flexion. Call if fever or chills or increased drainage. Go to ER if acutely short of breath or call for ambulance. Return for follow up in 2 weeks. May full weight bear on the surgical leg unless told otherwise. Use knee immobilizer until able to straight leg raise off bed with knee stable. In house walking for first 2 weeks. INSTRUCTIONS AFTER JOINT REPLACEMENT   Remove items at home which could result in a fall. This includes throw rugs or furniture in walking pathways ICE to the affected joint every three hours while awake for 30 minutes at a time, for at least the first 3-5 days, and then as needed for pain and swelling.  Continue to use ice for pain and swelling. You may notice swelling that will progress down to the foot and ankle.  This is normal after surgery.  Elevate your leg when you are not up walking on it.   Continue to use  the breathing machine you got in the hospital (incentive spirometer) which will help keep your temperature down.  It is common for your temperature to cycle up and down following surgery, especially at night when you are not up moving around and exerting yourself.  The breathing machine keeps your lungs expanded and your temperature down.   DIET:  As you were doing prior to hospitalization, we recommend a well-balanced diet.  DRESSING / WOUND CARE / SHOWERING  Keep the surgical dressing until follow up.  The dressing is water proof, so you can shower without any extra covering.  IF THE DRESSING FALLS OFF or the wound gets wet inside, change the dressing with sterile gauze.  Please use good hand washing  techniques before changing the dressing.  Do not use any lotions or creams on the incision until instructed by your surgeon.    ACTIVITY  Increase activity slowly as tolerated, but follow the weight bearing instructions below.   No driving for 6 weeks or until further direction given by your physician.  You cannot drive while taking narcotics.  No lifting or carrying greater than 10 lbs. until further directed by your surgeon. Avoid periods of inactivity such as sitting longer than an hour when not asleep. This helps prevent blood clots.  You may return to work once you are authorized by your doctor.     WEIGHT BEARING   Weight bearing as tolerated with assist device (walker, cane, etc) as directed, use it as long as suggested by your surgeon or therapist, typically at least 4-6 weeks.   EXERCISES  Results after joint replacement surgery are often greatly improved when you follow the exercise, range of motion and muscle strengthening exercises prescribed by your doctor. Safety measures are also important to protect the joint from further injury. Any time any of these exercises cause you to have increased pain or swelling, decrease what you are doing until you are comfortable again and then  slowly increase them. If you have problems or questions, call your caregiver or physical therapist for advice.   Rehabilitation is important following a joint replacement. After just a few days of immobilization, the muscles of the leg can become weakened and shrink (atrophy).  These exercises are designed to build up the tone and strength of the thigh and leg muscles and to improve motion. Often times heat used for twenty to thirty minutes before working out will loosen up your tissues and help with improving the range of motion but do not use heat for the first two weeks following surgery (sometimes heat can increase post-operative swelling).   These exercises can be done on a training (exercise) mat, on the floor, on a table or on a bed. Use whatever works the best and is most comfortable for you.    Use music or television while you are exercising so that the exercises are a pleasant break in your day. This will make your life better with the exercises acting as a break in your routine that you can look forward to.   Perform all exercises about fifteen times, three times per day or as directed.  You should exercise both the operative leg and the other leg as well.   Exercises include:   Quad Sets - Tighten up the muscle on the front of the thigh (Quad) and hold for 5-10 seconds.   Straight Leg Raises - With your knee straight (if you were given a brace, keep it on), lift the leg to 60 degrees, hold for 3 seconds, and slowly lower the leg.  Perform this exercise against resistance later as your leg gets stronger.  Leg Slides: Lying on your back, slowly slide your foot toward your buttocks, bending your knee up off the floor (only go as far as is comfortable). Then slowly slide your foot back down until your leg is flat on the floor again.  Angel Wings: Lying on your back spread your legs to the side as far apart as you can without causing discomfort.  Hamstring Strength:  Lying on your back, push your  heel against the floor with your leg straight by tightening up the muscles of your buttocks.  Repeat, but this time bend your knee to a  comfortable angle, and push your heel against the floor.  You may put a pillow under the heel to make it more comfortable if necessary.   A rehabilitation program following joint replacement surgery can speed recovery and prevent re-injury in the future due to weakened muscles. Contact your doctor or a physical therapist for more information on knee rehabilitation.    CONSTIPATION  Constipation is defined medically as fewer than three stools per week and severe constipation as less than one stool per week.  Even if you have a regular bowel pattern at home, your normal regimen is likely to be disrupted due to multiple reasons following surgery.  Combination of anesthesia, postoperative narcotics, change in appetite and fluid intake all can affect your bowels.   YOU MUST use at least one of the following options; they are listed in order of increasing strength to get the job done.  They are all available over the counter, and you may need to use some, POSSIBLY even all of these options:    Drink plenty of fluids (prune juice may be helpful) and high fiber foods Colace 100 mg by mouth twice a day  Senokot for constipation as directed and as needed Dulcolax (bisacodyl), take with full glass of water  Miralax (polyethylene glycol) once or twice a day as needed.  If you have tried all these things and are unable to have a bowel movement in the first 3-4 days after surgery call either your surgeon or your primary doctor.    If you experience loose stools or diarrhea, hold the medications until you stool forms back up.  If your symptoms do not get better within 1 week or if they get worse, check with your doctor.  If you experience "the worst abdominal pain ever" or develop nausea or vomiting, please contact the office immediately for further recommendations for  treatment.   ITCHING:  If you experience itching with your medications, try taking only a single pain pill, or even half a pain pill at a time.  You can also use Benadryl over the counter for itching or also to help with sleep.   TED HOSE STOCKINGS:  Use stockings on both legs until for at least 2 weeks or as directed by physician office. They may be removed at night for sleeping.  MEDICATIONS:  See your medication summary on the "After Visit Summary" that nursing will review with you.  You may have some home medications which will be placed on hold until you complete the course of blood thinner medication.  It is important for you to complete the blood thinner medication as prescribed.  PRECAUTIONS:  If you experience chest pain or shortness of breath - call 911 immediately for transfer to the hospital emergency department.   If you develop a fever greater that 101 F, purulent drainage from wound, increased redness or drainage from wound, foul odor from the wound/dressing, or calf pain - CONTACT YOUR SURGEON.                                                   FOLLOW-UP APPOINTMENTS:  If you do not already have a post-op appointment, please call the office for an appointment to be seen by your surgeon.  Guidelines for how soon to be seen are listed in your "After Visit Summary", but are typically between  1-4 weeks after surgery.  OTHER INSTRUCTIONS:   Knee Replacement:  Do not place pillow under knee, focus on keeping the knee straight while resting. CPM instructions: 0-90 degrees, 2 hours in the morning, 2 hours in the afternoon, and 2 hours in the evening. Place foam block, curve side up under heel at all times except when in CPM or when walking.  DO NOT modify, tear, cut, or change the foam block in any way.  MAKE SURE YOU:  Understand these instructions.  Get help right away if you are not doing well or get worse.    Thank you for letting us be a part of your medical care team.  It is a  privilege we respect greatly.  We hope these instructions will help you stay on track for a fast and full recovery!   Driving restrictions   Complete by:  As directed    No driving for 3-4 weeks   Increase activity slowly as tolerated   Complete by:  As directed    Lifting restrictions   Complete by:  As directed    No lifting for8 weeks      Follow-up Information    Kathryne Hitch, MD Follow up in 2 week(s).   Specialty:  Orthopedic Surgery Contact information: [address] [place] [address] [phone]        [facility] Follow up.   Specialty:  Home Health Services Why:  Home Health Physical Therapy-agency will call to arrange visits Contact information: [address] [place] [address] [phone]            Signed: Richardean Canal 10/11/2018, 1:01 PM

## 2018-10-12 DIAGNOSIS — Z471 Aftercare following joint replacement surgery: Secondary | ICD-10-CM | POA: Diagnosis not present

## 2018-10-12 DIAGNOSIS — Z7982 Long term (current) use of aspirin: Secondary | ICD-10-CM | POA: Diagnosis not present

## 2018-10-12 DIAGNOSIS — M4316 Spondylolisthesis, lumbar region: Secondary | ICD-10-CM | POA: Diagnosis not present

## 2018-10-12 DIAGNOSIS — Z96653 Presence of artificial knee joint, bilateral: Secondary | ICD-10-CM | POA: Diagnosis not present

## 2018-10-12 DIAGNOSIS — M5116 Intervertebral disc disorders with radiculopathy, lumbar region: Secondary | ICD-10-CM | POA: Diagnosis not present

## 2018-10-12 DIAGNOSIS — Z96642 Presence of left artificial hip joint: Secondary | ICD-10-CM | POA: Diagnosis not present

## 2018-10-13 DIAGNOSIS — Z96653 Presence of artificial knee joint, bilateral: Secondary | ICD-10-CM | POA: Diagnosis not present

## 2018-10-13 DIAGNOSIS — Z96642 Presence of left artificial hip joint: Secondary | ICD-10-CM | POA: Diagnosis not present

## 2018-10-13 DIAGNOSIS — M4316 Spondylolisthesis, lumbar region: Secondary | ICD-10-CM | POA: Diagnosis not present

## 2018-10-13 DIAGNOSIS — Z471 Aftercare following joint replacement surgery: Secondary | ICD-10-CM | POA: Diagnosis not present

## 2018-10-13 DIAGNOSIS — M5116 Intervertebral disc disorders with radiculopathy, lumbar region: Secondary | ICD-10-CM | POA: Diagnosis not present

## 2018-10-13 DIAGNOSIS — Z7982 Long term (current) use of aspirin: Secondary | ICD-10-CM | POA: Diagnosis not present

## 2018-10-14 ENCOUNTER — Ambulatory Visit (INDEPENDENT_AMBULATORY_CARE_PROVIDER_SITE_OTHER): Payer: Medicare Other | Admitting: Orthopaedic Surgery

## 2018-10-14 ENCOUNTER — Encounter (INDEPENDENT_AMBULATORY_CARE_PROVIDER_SITE_OTHER): Payer: Self-pay | Admitting: Orthopaedic Surgery

## 2018-10-14 DIAGNOSIS — Z96651 Presence of right artificial knee joint: Secondary | ICD-10-CM

## 2018-10-14 MED ORDER — METHOCARBAMOL 500 MG PO TABS: 500.0000 mg | ORAL_TABLET | Freq: Four times a day (QID) | ORAL | 0 refills | Status: DC | PRN

## 2018-10-14 MED ORDER — GABAPENTIN 100 MG PO CAPS: 300.0000 mg | ORAL_CAPSULE | Freq: Three times a day (TID) | ORAL | 0 refills | Status: DC

## 2018-10-14 NOTE — Progress Notes (Signed)
The patient is 2 weeks tomorrow status post a right total knee arthroplasty.  He is been on 325 mg aspirin twice a day and she is just taking Tylenol extra strength as well as Robaxin and she is been on 100 mg of Neurontin 3 times a day but would like to increase that.  Notes from physical therapy say that she is flexed her knee to 90 degrees and she is doing well overall.  She is used a walker today in the office but she has a cane at home she has already transitioned to using..  She is very satisfied thus far.  On exam her extension is almost full and her flexion is to 90 degrees of the right knee.  Her incisions well-healed no placed new Steri-Strips.  Her calf is soft.  Her foot is well-perfused.  At this point she will transition outpatient physical therapy.  She would like to have this done [facility].  They can work on aggressive range of motion of her right knee as well as balance and coordination and strengthening.  I will send in 300 mg of gabapentin to take 3 times a day as well as continue her Robaxin.  She can stop her aspirin.  She will continue her extra strength Tylenol as needed as well.  All question concerns were answered and addressed.  We will see her back in 4 weeks to see how she is doing overall but no x-rays are needed.

## 2018-10-15 ENCOUNTER — Other Ambulatory Visit (INDEPENDENT_AMBULATORY_CARE_PROVIDER_SITE_OTHER): Payer: Self-pay

## 2018-10-15 DIAGNOSIS — Z96651 Presence of right artificial knee joint: Secondary | ICD-10-CM

## 2018-10-18 ENCOUNTER — Telehealth (INDEPENDENT_AMBULATORY_CARE_PROVIDER_SITE_OTHER): Payer: Self-pay | Admitting: Orthopaedic Surgery

## 2018-10-18 NOTE — Telephone Encounter (Signed)
We need to see about getting her therapy elsewhere in the [facility] if she cannot be seen until December 5.  Another option would be to have home therapy continue to work with her until then.

## 2018-10-18 NOTE — Telephone Encounter (Signed)
Please advise 

## 2018-10-18 NOTE — Telephone Encounter (Signed)
Patient left message requesting a call from Dr Magnus IvanBlackman regarding her referral for outpatient PT to [facility]. Pt states that she was told that the first available date for eval was 10/28/18. She's upset that it's going to be 2 wks without any PT and wanted to know if its ok or should she try another location.

## 2018-10-19 NOTE — Telephone Encounter (Signed)
Order [facility]

## 2018-10-20 DIAGNOSIS — M6281 Muscle weakness (generalized): Secondary | ICD-10-CM | POA: Diagnosis not present

## 2018-10-20 DIAGNOSIS — R262 Difficulty in walking, not elsewhere classified: Secondary | ICD-10-CM | POA: Diagnosis not present

## 2018-10-20 DIAGNOSIS — M25561 Pain in right knee: Secondary | ICD-10-CM | POA: Diagnosis not present

## 2018-10-20 DIAGNOSIS — M1711 Unilateral primary osteoarthritis, right knee: Secondary | ICD-10-CM | POA: Diagnosis not present

## 2018-10-27 ENCOUNTER — Ambulatory Visit (HOSPITAL_BASED_OUTPATIENT_CLINIC_OR_DEPARTMENT_OTHER)
Admission: RE | Admit: 2018-10-27 | Discharge: 2018-10-27 | Disposition: A | Discharge status: Home / Self Care | Payer: Medicare Other | Source: Ambulatory Visit | Attending: Orthopaedic Surgery | Admitting: Orthopaedic Surgery

## 2018-10-27 ENCOUNTER — Other Ambulatory Visit (INDEPENDENT_AMBULATORY_CARE_PROVIDER_SITE_OTHER): Payer: Self-pay | Admitting: Orthopaedic Surgery

## 2018-10-27 DIAGNOSIS — M79661 Pain in right lower leg: Secondary | ICD-10-CM | POA: Diagnosis not present

## 2018-10-27 DIAGNOSIS — M25561 Pain in right knee: Secondary | ICD-10-CM | POA: Diagnosis not present

## 2018-10-27 DIAGNOSIS — M1711 Unilateral primary osteoarthritis, right knee: Secondary | ICD-10-CM | POA: Diagnosis not present

## 2018-10-27 DIAGNOSIS — R262 Difficulty in walking, not elsewhere classified: Secondary | ICD-10-CM | POA: Diagnosis not present

## 2018-10-27 DIAGNOSIS — M7121 Synovial cyst of popliteal space [Baker], right knee: Secondary | ICD-10-CM | POA: Diagnosis not present

## 2018-10-27 DIAGNOSIS — Z96651 Presence of right artificial knee joint: Secondary | ICD-10-CM | POA: Insufficient documentation

## 2018-10-27 DIAGNOSIS — M6281 Muscle weakness (generalized): Secondary | ICD-10-CM | POA: Diagnosis not present

## 2018-10-28 ENCOUNTER — Ambulatory Visit: Payer: Medicare Other

## 2018-10-28 ENCOUNTER — Telehealth (INDEPENDENT_AMBULATORY_CARE_PROVIDER_SITE_OTHER): Payer: Self-pay | Admitting: Orthopaedic Surgery

## 2018-10-28 NOTE — Telephone Encounter (Signed)
Her ultrasound was negative for DVT.  It did show soft tissue swelling in the back of her need to be expected from knee replacement surgery.  She should continue to work on ice and elevation and should wear compressive hose and this will help as well as time.

## 2018-10-28 NOTE — Telephone Encounter (Signed)
Please advise 

## 2018-10-28 NOTE — Telephone Encounter (Signed)
Patient called asked for a call back concerning the results of the ultrasound she had on her leg yesterday. The number to contact patient is [phone]

## 2018-10-28 NOTE — Telephone Encounter (Signed)
Patient aware of the below message  

## 2018-10-29 DIAGNOSIS — R262 Difficulty in walking, not elsewhere classified: Secondary | ICD-10-CM | POA: Diagnosis not present

## 2018-10-29 DIAGNOSIS — M1711 Unilateral primary osteoarthritis, right knee: Secondary | ICD-10-CM | POA: Diagnosis not present

## 2018-10-29 DIAGNOSIS — M25561 Pain in right knee: Secondary | ICD-10-CM | POA: Diagnosis not present

## 2018-10-29 DIAGNOSIS — M6281 Muscle weakness (generalized): Secondary | ICD-10-CM | POA: Diagnosis not present

## 2018-11-01 DIAGNOSIS — M6281 Muscle weakness (generalized): Secondary | ICD-10-CM | POA: Diagnosis not present

## 2018-11-01 DIAGNOSIS — R262 Difficulty in walking, not elsewhere classified: Secondary | ICD-10-CM | POA: Diagnosis not present

## 2018-11-01 DIAGNOSIS — M25561 Pain in right knee: Secondary | ICD-10-CM | POA: Diagnosis not present

## 2018-11-01 DIAGNOSIS — M1711 Unilateral primary osteoarthritis, right knee: Secondary | ICD-10-CM | POA: Diagnosis not present

## 2018-11-03 DIAGNOSIS — M1711 Unilateral primary osteoarthritis, right knee: Secondary | ICD-10-CM | POA: Diagnosis not present

## 2018-11-03 DIAGNOSIS — R262 Difficulty in walking, not elsewhere classified: Secondary | ICD-10-CM | POA: Diagnosis not present

## 2018-11-03 DIAGNOSIS — M6281 Muscle weakness (generalized): Secondary | ICD-10-CM | POA: Diagnosis not present

## 2018-11-03 DIAGNOSIS — M25561 Pain in right knee: Secondary | ICD-10-CM | POA: Diagnosis not present

## 2018-11-08 DIAGNOSIS — M1711 Unilateral primary osteoarthritis, right knee: Secondary | ICD-10-CM | POA: Diagnosis not present

## 2018-11-08 DIAGNOSIS — M25561 Pain in right knee: Secondary | ICD-10-CM | POA: Diagnosis not present

## 2018-11-08 DIAGNOSIS — M6281 Muscle weakness (generalized): Secondary | ICD-10-CM | POA: Diagnosis not present

## 2018-11-08 DIAGNOSIS — R262 Difficulty in walking, not elsewhere classified: Secondary | ICD-10-CM | POA: Diagnosis not present

## 2018-11-10 DIAGNOSIS — M1711 Unilateral primary osteoarthritis, right knee: Secondary | ICD-10-CM | POA: Diagnosis not present

## 2018-11-10 DIAGNOSIS — M6281 Muscle weakness (generalized): Secondary | ICD-10-CM | POA: Diagnosis not present

## 2018-11-10 DIAGNOSIS — M25561 Pain in right knee: Secondary | ICD-10-CM | POA: Diagnosis not present

## 2018-11-10 DIAGNOSIS — R262 Difficulty in walking, not elsewhere classified: Secondary | ICD-10-CM | POA: Diagnosis not present

## 2018-11-11 ENCOUNTER — Ambulatory Visit (INDEPENDENT_AMBULATORY_CARE_PROVIDER_SITE_OTHER): Payer: Medicare Other | Admitting: Orthopaedic Surgery

## 2018-11-11 ENCOUNTER — Encounter (INDEPENDENT_AMBULATORY_CARE_PROVIDER_SITE_OTHER): Payer: Self-pay | Admitting: Orthopaedic Surgery

## 2018-11-11 DIAGNOSIS — Z96651 Presence of right artificial knee joint: Secondary | ICD-10-CM

## 2018-11-11 MED ORDER — METHOCARBAMOL 500 MG PO TABS: 500.0000 mg | ORAL_TABLET | Freq: Four times a day (QID) | ORAL | 0 refills | Status: DC | PRN

## 2018-11-11 MED ORDER — GABAPENTIN 100 MG PO CAPS: 300.0000 mg | ORAL_CAPSULE | Freq: Three times a day (TID) | ORAL | 0 refills | Status: DC

## 2018-11-11 NOTE — Progress Notes (Signed)
The patient is 6 weeks status post a right total knee arthroplasty.  She is very active 72 years old.  She has been diligent with her physical therapy and pushing herself hard and getting herself through therapy.  On exam her extension is almost full and her flexion is to past 100 degrees.  Her right knee feels ligamentously stable.  Incisions well-healed.  I gave her encouragement that she is doing excellent.  We will send in some more Neurontin and Robaxin for her.  She will continue therapy and aggressively get her knee moving.  We will see her back in 3 months with a AP and lateral of her right operative knee.  All question concerns were answered and addressed.

## 2018-11-15 DIAGNOSIS — R262 Difficulty in walking, not elsewhere classified: Secondary | ICD-10-CM | POA: Diagnosis not present

## 2018-11-15 DIAGNOSIS — M1711 Unilateral primary osteoarthritis, right knee: Secondary | ICD-10-CM | POA: Diagnosis not present

## 2018-11-15 DIAGNOSIS — M6281 Muscle weakness (generalized): Secondary | ICD-10-CM | POA: Diagnosis not present

## 2018-11-15 DIAGNOSIS — M25561 Pain in right knee: Secondary | ICD-10-CM | POA: Diagnosis not present

## 2018-11-18 DIAGNOSIS — M6281 Muscle weakness (generalized): Secondary | ICD-10-CM | POA: Diagnosis not present

## 2018-11-18 DIAGNOSIS — R262 Difficulty in walking, not elsewhere classified: Secondary | ICD-10-CM | POA: Diagnosis not present

## 2018-11-18 DIAGNOSIS — M25561 Pain in right knee: Secondary | ICD-10-CM | POA: Diagnosis not present

## 2018-11-18 DIAGNOSIS — M1711 Unilateral primary osteoarthritis, right knee: Secondary | ICD-10-CM | POA: Diagnosis not present

## 2018-11-22 DIAGNOSIS — M6281 Muscle weakness (generalized): Secondary | ICD-10-CM | POA: Diagnosis not present

## 2018-11-22 DIAGNOSIS — R262 Difficulty in walking, not elsewhere classified: Secondary | ICD-10-CM | POA: Diagnosis not present

## 2018-11-22 DIAGNOSIS — M1711 Unilateral primary osteoarthritis, right knee: Secondary | ICD-10-CM | POA: Diagnosis not present

## 2018-11-22 DIAGNOSIS — M25561 Pain in right knee: Secondary | ICD-10-CM | POA: Diagnosis not present

## 2018-11-25 DIAGNOSIS — R262 Difficulty in walking, not elsewhere classified: Secondary | ICD-10-CM | POA: Diagnosis not present

## 2018-11-25 DIAGNOSIS — M1711 Unilateral primary osteoarthritis, right knee: Secondary | ICD-10-CM | POA: Diagnosis not present

## 2018-11-25 DIAGNOSIS — M6281 Muscle weakness (generalized): Secondary | ICD-10-CM | POA: Diagnosis not present

## 2018-11-25 DIAGNOSIS — M25561 Pain in right knee: Secondary | ICD-10-CM | POA: Diagnosis not present

## 2018-11-29 DIAGNOSIS — R262 Difficulty in walking, not elsewhere classified: Secondary | ICD-10-CM | POA: Diagnosis not present

## 2018-11-29 DIAGNOSIS — M1711 Unilateral primary osteoarthritis, right knee: Secondary | ICD-10-CM | POA: Diagnosis not present

## 2018-11-29 DIAGNOSIS — M6281 Muscle weakness (generalized): Secondary | ICD-10-CM | POA: Diagnosis not present

## 2018-11-29 DIAGNOSIS — M25561 Pain in right knee: Secondary | ICD-10-CM | POA: Diagnosis not present

## 2018-12-01 DIAGNOSIS — M1711 Unilateral primary osteoarthritis, right knee: Secondary | ICD-10-CM | POA: Diagnosis not present

## 2018-12-01 DIAGNOSIS — M25561 Pain in right knee: Secondary | ICD-10-CM | POA: Diagnosis not present

## 2018-12-01 DIAGNOSIS — M6281 Muscle weakness (generalized): Secondary | ICD-10-CM | POA: Diagnosis not present

## 2018-12-01 DIAGNOSIS — R262 Difficulty in walking, not elsewhere classified: Secondary | ICD-10-CM | POA: Diagnosis not present

## 2018-12-05 ENCOUNTER — Other Ambulatory Visit (INDEPENDENT_AMBULATORY_CARE_PROVIDER_SITE_OTHER): Payer: Self-pay | Admitting: Orthopaedic Surgery

## 2018-12-06 DIAGNOSIS — M1711 Unilateral primary osteoarthritis, right knee: Secondary | ICD-10-CM | POA: Diagnosis not present

## 2018-12-06 DIAGNOSIS — M6281 Muscle weakness (generalized): Secondary | ICD-10-CM | POA: Diagnosis not present

## 2018-12-06 DIAGNOSIS — R262 Difficulty in walking, not elsewhere classified: Secondary | ICD-10-CM | POA: Diagnosis not present

## 2018-12-06 DIAGNOSIS — M25561 Pain in right knee: Secondary | ICD-10-CM | POA: Diagnosis not present

## 2018-12-06 NOTE — Telephone Encounter (Signed)
Please advise 

## 2018-12-08 DIAGNOSIS — M1711 Unilateral primary osteoarthritis, right knee: Secondary | ICD-10-CM | POA: Diagnosis not present

## 2018-12-08 DIAGNOSIS — M6281 Muscle weakness (generalized): Secondary | ICD-10-CM | POA: Diagnosis not present

## 2018-12-08 DIAGNOSIS — R262 Difficulty in walking, not elsewhere classified: Secondary | ICD-10-CM | POA: Diagnosis not present

## 2018-12-08 DIAGNOSIS — M25561 Pain in right knee: Secondary | ICD-10-CM | POA: Diagnosis not present

## 2018-12-15 DIAGNOSIS — M6281 Muscle weakness (generalized): Secondary | ICD-10-CM | POA: Diagnosis not present

## 2018-12-15 DIAGNOSIS — R262 Difficulty in walking, not elsewhere classified: Secondary | ICD-10-CM | POA: Diagnosis not present

## 2018-12-15 DIAGNOSIS — M25561 Pain in right knee: Secondary | ICD-10-CM | POA: Diagnosis not present

## 2018-12-15 DIAGNOSIS — M1711 Unilateral primary osteoarthritis, right knee: Secondary | ICD-10-CM | POA: Diagnosis not present

## 2018-12-20 ENCOUNTER — Other Ambulatory Visit (INDEPENDENT_AMBULATORY_CARE_PROVIDER_SITE_OTHER): Payer: Self-pay | Admitting: Orthopaedic Surgery

## 2018-12-20 NOTE — Telephone Encounter (Signed)
Please advise 

## 2018-12-22 DIAGNOSIS — M25561 Pain in right knee: Secondary | ICD-10-CM | POA: Diagnosis not present

## 2018-12-22 DIAGNOSIS — M1711 Unilateral primary osteoarthritis, right knee: Secondary | ICD-10-CM | POA: Diagnosis not present

## 2018-12-22 DIAGNOSIS — M6281 Muscle weakness (generalized): Secondary | ICD-10-CM | POA: Diagnosis not present

## 2018-12-22 DIAGNOSIS — R262 Difficulty in walking, not elsewhere classified: Secondary | ICD-10-CM | POA: Diagnosis not present

## 2018-12-29 DIAGNOSIS — M1711 Unilateral primary osteoarthritis, right knee: Secondary | ICD-10-CM | POA: Diagnosis not present

## 2018-12-29 DIAGNOSIS — M25561 Pain in right knee: Secondary | ICD-10-CM | POA: Diagnosis not present

## 2018-12-29 DIAGNOSIS — M6281 Muscle weakness (generalized): Secondary | ICD-10-CM | POA: Diagnosis not present

## 2018-12-29 DIAGNOSIS — R262 Difficulty in walking, not elsewhere classified: Secondary | ICD-10-CM | POA: Diagnosis not present

## 2019-01-03 ENCOUNTER — Other Ambulatory Visit (INDEPENDENT_AMBULATORY_CARE_PROVIDER_SITE_OTHER): Payer: Self-pay | Admitting: Orthopaedic Surgery

## 2019-01-03 NOTE — Telephone Encounter (Signed)
Please advise 

## 2019-01-05 ENCOUNTER — Telehealth (INDEPENDENT_AMBULATORY_CARE_PROVIDER_SITE_OTHER): Payer: Self-pay | Admitting: Orthopaedic Surgery

## 2019-01-05 DIAGNOSIS — M1711 Unilateral primary osteoarthritis, right knee: Secondary | ICD-10-CM | POA: Diagnosis not present

## 2019-01-05 DIAGNOSIS — R262 Difficulty in walking, not elsewhere classified: Secondary | ICD-10-CM | POA: Diagnosis not present

## 2019-01-05 DIAGNOSIS — M6281 Muscle weakness (generalized): Secondary | ICD-10-CM | POA: Diagnosis not present

## 2019-01-05 DIAGNOSIS — M25561 Pain in right knee: Secondary | ICD-10-CM | POA: Diagnosis not present

## 2019-01-05 NOTE — Telephone Encounter (Signed)
Patient called and stated that she needs a new order to be sent to [facility] for new pain she is having since she completed the knee replacement PT. She was released from that today.   New order is for pain in hamstring and muscles in both legs.  Please forward new order to Dr. Watt Climes @ [facility] [phone] fax#

## 2019-01-05 NOTE — Telephone Encounter (Signed)
I am actually fine with a new order for therapy for them to work on those things.  I do not need to see her first.

## 2019-01-05 NOTE — Telephone Encounter (Signed)
Ok? Ir do you want to see her first?

## 2019-01-06 NOTE — Telephone Encounter (Signed)
Faxed

## 2019-01-12 DIAGNOSIS — M6281 Muscle weakness (generalized): Secondary | ICD-10-CM | POA: Diagnosis not present

## 2019-01-12 DIAGNOSIS — M79605 Pain in left leg: Secondary | ICD-10-CM | POA: Diagnosis not present

## 2019-01-12 DIAGNOSIS — M79604 Pain in right leg: Secondary | ICD-10-CM | POA: Diagnosis not present

## 2019-01-12 DIAGNOSIS — M25561 Pain in right knee: Secondary | ICD-10-CM | POA: Diagnosis not present

## 2019-01-31 DIAGNOSIS — J181 Lobar pneumonia, unspecified organism: Secondary | ICD-10-CM | POA: Diagnosis not present

## 2019-01-31 DIAGNOSIS — J029 Acute pharyngitis, unspecified: Secondary | ICD-10-CM | POA: Diagnosis not present

## 2019-01-31 DIAGNOSIS — R509 Fever, unspecified: Secondary | ICD-10-CM | POA: Diagnosis not present

## 2019-01-31 DIAGNOSIS — J309 Allergic rhinitis, unspecified: Secondary | ICD-10-CM | POA: Diagnosis not present

## 2019-02-08 ENCOUNTER — Other Ambulatory Visit (INDEPENDENT_AMBULATORY_CARE_PROVIDER_SITE_OTHER): Payer: Self-pay | Admitting: Orthopaedic Surgery

## 2019-02-11 ENCOUNTER — Telehealth (INDEPENDENT_AMBULATORY_CARE_PROVIDER_SITE_OTHER): Payer: Self-pay

## 2019-02-11 NOTE — Telephone Encounter (Signed)
Called patient and asked the screening questions.  Do you have now or have you had in the past 7 days a fever and/or chills? NO  Do you have now or have you had in the past 7 days a cough? NO  Do you have now or have you had in the last 7 days nausea, vomiting or abdominal pain? NO  Have you been exposed to anyone who has tested positive for COVID-19? NO  Have you or anyone who lives with you traveled within the last month? NO 

## 2019-02-14 ENCOUNTER — Ambulatory Visit (INDEPENDENT_AMBULATORY_CARE_PROVIDER_SITE_OTHER): Payer: Medicare Other | Admitting: Orthopaedic Surgery

## 2019-02-22 DIAGNOSIS — J181 Lobar pneumonia, unspecified organism: Secondary | ICD-10-CM | POA: Diagnosis not present

## 2019-02-22 DIAGNOSIS — R509 Fever, unspecified: Secondary | ICD-10-CM | POA: Diagnosis not present

## 2019-02-22 DIAGNOSIS — J309 Allergic rhinitis, unspecified: Secondary | ICD-10-CM | POA: Diagnosis not present

## 2019-03-14 ENCOUNTER — Ambulatory Visit (INDEPENDENT_AMBULATORY_CARE_PROVIDER_SITE_OTHER): Payer: Medicare Other | Admitting: Orthopaedic Surgery

## 2019-03-14 ENCOUNTER — Other Ambulatory Visit (INDEPENDENT_AMBULATORY_CARE_PROVIDER_SITE_OTHER): Payer: Self-pay | Admitting: Physician Assistant

## 2019-03-14 NOTE — Telephone Encounter (Signed)
Please advise 

## 2019-04-26 ENCOUNTER — Telehealth: Payer: Self-pay

## 2019-04-26 ENCOUNTER — Other Ambulatory Visit (INDEPENDENT_AMBULATORY_CARE_PROVIDER_SITE_OTHER): Payer: Self-pay | Admitting: Physician Assistant

## 2019-04-26 NOTE — Telephone Encounter (Signed)
Rx request 

## 2019-04-26 NOTE — Telephone Encounter (Signed)
Wants left knee gel injection Can we get approval [phone]

## 2019-04-27 NOTE — Telephone Encounter (Signed)
Noted  

## 2019-04-28 ENCOUNTER — Telehealth: Payer: Self-pay

## 2019-04-28 NOTE — Telephone Encounter (Signed)
Submitted VOB for SynviscOne, left knee. 

## 2019-05-10 ENCOUNTER — Telehealth: Payer: Self-pay

## 2019-05-10 NOTE — Telephone Encounter (Signed)
Submitted for Monovisc, left knee due to patient using Monovisc for right knee.

## 2019-05-12 ENCOUNTER — Telehealth: Payer: Self-pay

## 2019-05-12 NOTE — Telephone Encounter (Signed)
Talked with patient and advised her that she is approved for gel injection.  Approved for Monovisc, left knee. Buy & Bill Covered at 100% through her insurance. Deductible has been met. No Co-pay No PA required  Appt. 05/16/2019 with Dr. Ninfa Linden

## 2019-05-16 ENCOUNTER — Ambulatory Visit: Payer: Medicare Other | Admitting: Orthopaedic Surgery

## 2019-05-16 ENCOUNTER — Other Ambulatory Visit: Payer: Self-pay

## 2019-05-16 ENCOUNTER — Ambulatory Visit (INDEPENDENT_AMBULATORY_CARE_PROVIDER_SITE_OTHER): Payer: Medicare Other | Admitting: Orthopaedic Surgery

## 2019-05-16 ENCOUNTER — Ambulatory Visit (INDEPENDENT_AMBULATORY_CARE_PROVIDER_SITE_OTHER): Payer: Medicare Other

## 2019-05-16 ENCOUNTER — Encounter: Payer: Self-pay | Admitting: Orthopaedic Surgery

## 2019-05-16 DIAGNOSIS — Z96651 Presence of right artificial knee joint: Secondary | ICD-10-CM

## 2019-05-16 MED ORDER — LIDOCAINE HCL 1 % IJ SOLN
3.0000 mL | INTRAMUSCULAR | Status: AC | PRN
  Administered 2019-05-16: 3 mL

## 2019-05-16 MED ORDER — HYALURONAN 88 MG/4ML IX SOSY
88.0000 mg | PREFILLED_SYRINGE | INTRA_ARTICULAR | Status: AC | PRN
  Administered 2019-05-16: 88 mg via INTRA_ARTICULAR

## 2019-05-16 NOTE — Progress Notes (Signed)
   Procedure Note  Patient: Bianca Ware             Date of Birth: Jan 01, 1946           MRN: [id number]             Visit Date: 05/16/2019  Procedures: Visit Diagnoses: Status post total knee replacement, right - Plan: XR Knee 1-2 Views Right  Large Joint Inj: L knee on 05/16/2019 9:52 AM Indications: diagnostic evaluation and pain Details: 22 G 1.5 in needle, superolateral approach  Arthrogram: No  Medications: 3 mL lidocaine 1 %; 88 mg Hyaluronan 88 MG/4ML Outcome: tolerated well, no immediate complications Procedure, treatment alternatives, risks and benefits explained, specific risks discussed. Consent was given by the patient. Immediately prior to procedure a time out was called to verify the correct patient, procedure, equipment, support staff and site/side marked as required. Patient was prepped and draped in the usual sterile fashion.     The patient is here today for scheduled hyaluronic acid injection in her left knee to treat the pain from osteoarthritis.  It is mainly medial joint line tenderness.  She is also 7 months status post a right total knee arthroplasty so this is actually a charge visit today for the right knee in terms of needing follow-up x-rays.  She is had no problems with that right knee other than some slight numbness.  She is an active 73 year old female.  She reports better motion overall and decreased pain with the right knee.  We are needing to x-ray today since this is a press-fit noncemented knee.  Examination of both knees showed no effusion.  She has excellent range of motion of both knees.  Her right knee shows subjective numbness over the lateral aspect of the knee with a well-healed incision.  The knee feels ligamentously stable.  The left knee has mainly medial joint line tenderness.  The left knee is ligamentously stable as well with good range of motion.  She tolerated the Monovisc injection well.  X-rays of the right knee show well-seated  implant with no complicating features or evidence of loosening.  All question concerns were answered and addressed.  At this point follow-up can be as needed.

## 2019-05-18 ENCOUNTER — Other Ambulatory Visit: Payer: Self-pay | Admitting: Internal Medicine

## 2019-05-18 DIAGNOSIS — Z1231 Encounter for screening mammogram for malignant neoplasm of breast: Secondary | ICD-10-CM

## 2019-06-10 ENCOUNTER — Other Ambulatory Visit (INDEPENDENT_AMBULATORY_CARE_PROVIDER_SITE_OTHER): Payer: Self-pay | Admitting: Physician Assistant

## 2019-06-10 NOTE — Telephone Encounter (Signed)
PLEASE ADVISE.

## 2019-06-13 NOTE — Telephone Encounter (Signed)
Left message notifying patient.

## 2019-07-04 ENCOUNTER — Ambulatory Visit: Payer: Medicare Other

## 2019-07-19 ENCOUNTER — Other Ambulatory Visit (INDEPENDENT_AMBULATORY_CARE_PROVIDER_SITE_OTHER): Payer: Self-pay | Admitting: Orthopaedic Surgery

## 2019-07-20 ENCOUNTER — Ambulatory Visit
Admission: RE | Admit: 2019-07-20 | Discharge: 2019-07-20 | Disposition: A | Discharge status: Home / Self Care | Payer: Medicare Other | Source: Ambulatory Visit | Attending: Internal Medicine | Admitting: Internal Medicine

## 2019-07-20 ENCOUNTER — Other Ambulatory Visit: Payer: Self-pay

## 2019-07-20 DIAGNOSIS — Z1231 Encounter for screening mammogram for malignant neoplasm of breast: Secondary | ICD-10-CM | POA: Diagnosis not present

## 2019-07-20 NOTE — Telephone Encounter (Signed)
Refill

## 2019-08-04 DIAGNOSIS — Z23 Encounter for immunization: Secondary | ICD-10-CM | POA: Diagnosis not present

## 2019-09-16 DIAGNOSIS — E7849 Other hyperlipidemia: Secondary | ICD-10-CM | POA: Diagnosis not present

## 2019-09-19 DIAGNOSIS — R82998 Other abnormal findings in urine: Secondary | ICD-10-CM | POA: Diagnosis not present

## 2019-09-20 DIAGNOSIS — Z1212 Encounter for screening for malignant neoplasm of rectum: Secondary | ICD-10-CM | POA: Diagnosis not present

## 2019-09-23 DIAGNOSIS — K589 Irritable bowel syndrome without diarrhea: Secondary | ICD-10-CM | POA: Diagnosis not present

## 2019-09-23 DIAGNOSIS — M1990 Unspecified osteoarthritis, unspecified site: Secondary | ICD-10-CM | POA: Diagnosis not present

## 2019-09-23 DIAGNOSIS — M5416 Radiculopathy, lumbar region: Secondary | ICD-10-CM | POA: Diagnosis not present

## 2019-09-23 DIAGNOSIS — E785 Hyperlipidemia, unspecified: Secondary | ICD-10-CM | POA: Diagnosis not present

## 2019-09-23 DIAGNOSIS — J309 Allergic rhinitis, unspecified: Secondary | ICD-10-CM | POA: Diagnosis not present

## 2019-09-23 DIAGNOSIS — Z1331 Encounter for screening for depression: Secondary | ICD-10-CM | POA: Diagnosis not present

## 2019-09-23 DIAGNOSIS — I8390 Asymptomatic varicose veins of unspecified lower extremity: Secondary | ICD-10-CM | POA: Diagnosis not present

## 2019-09-23 DIAGNOSIS — J181 Lobar pneumonia, unspecified organism: Secondary | ICD-10-CM | POA: Diagnosis not present

## 2019-09-23 DIAGNOSIS — E669 Obesity, unspecified: Secondary | ICD-10-CM | POA: Diagnosis not present

## 2019-09-23 DIAGNOSIS — Z0000 Encounter for general adult medical examination without abnormal findings: Secondary | ICD-10-CM | POA: Diagnosis not present

## 2019-10-31 DIAGNOSIS — H25811 Combined forms of age-related cataract, right eye: Secondary | ICD-10-CM | POA: Diagnosis not present

## 2019-10-31 DIAGNOSIS — Z961 Presence of intraocular lens: Secondary | ICD-10-CM | POA: Diagnosis not present

## 2019-11-02 IMAGING — MG DIGITAL SCREENING BILATERAL MAMMOGRAM WITH TOMO AND CAD
6 of 10 series · 6 of 30 positions shown · non-contrast
Comparison: Previous exam(s).

CLINICAL DATA: Screening.

EXAM:
DIGITAL SCREENING BILATERAL MAMMOGRAM WITH TOMO AND CAD

[L CC synth-2D]
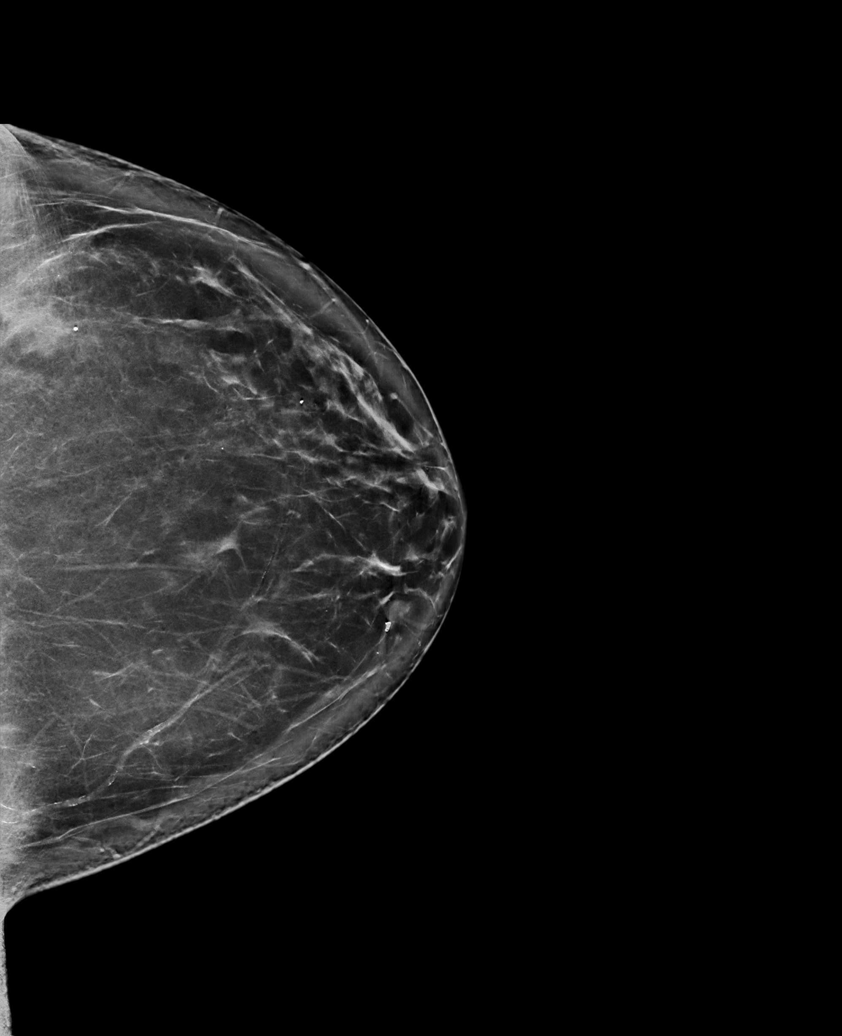

[R MLO synth-2D (1 of 2)]
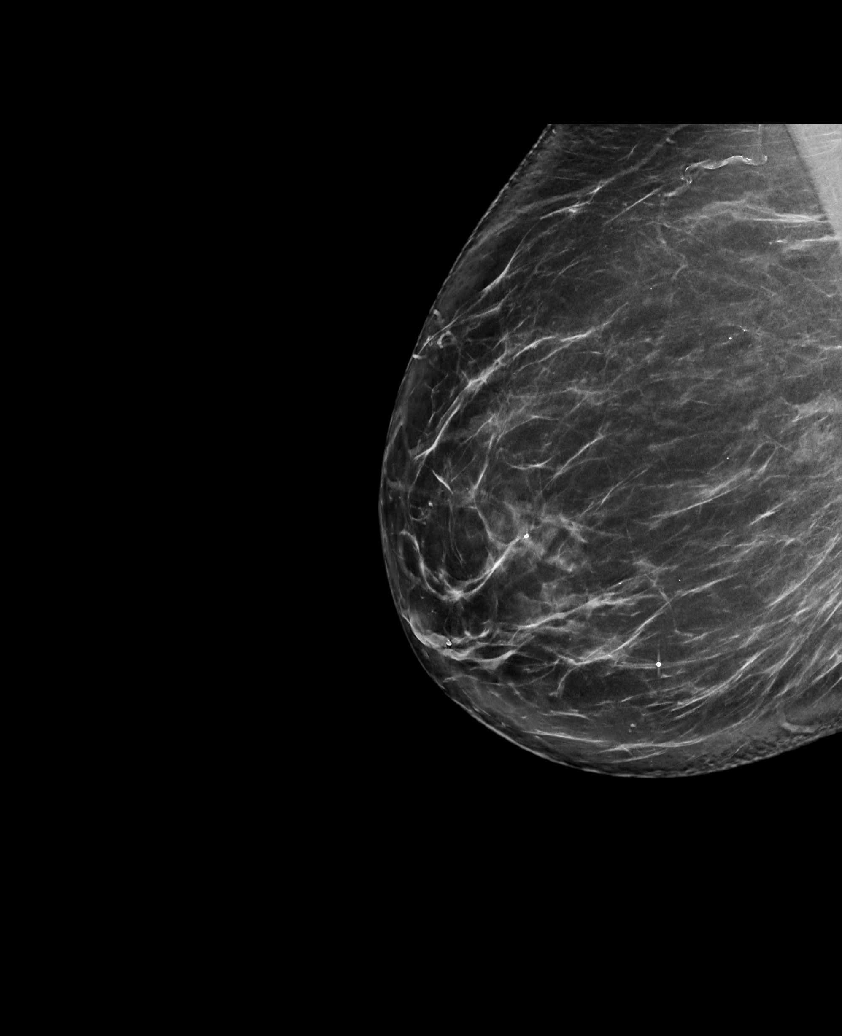

[L MLO synth-2D]
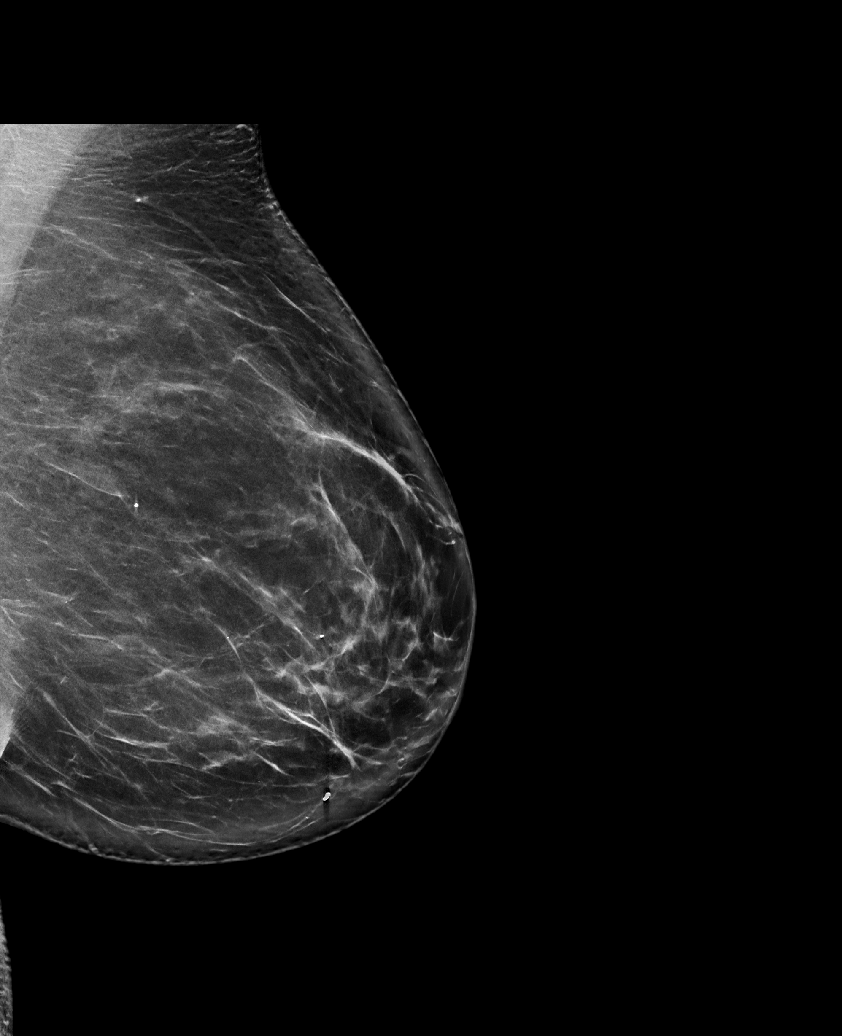

[R MLO synth-2D (2 of 2)]
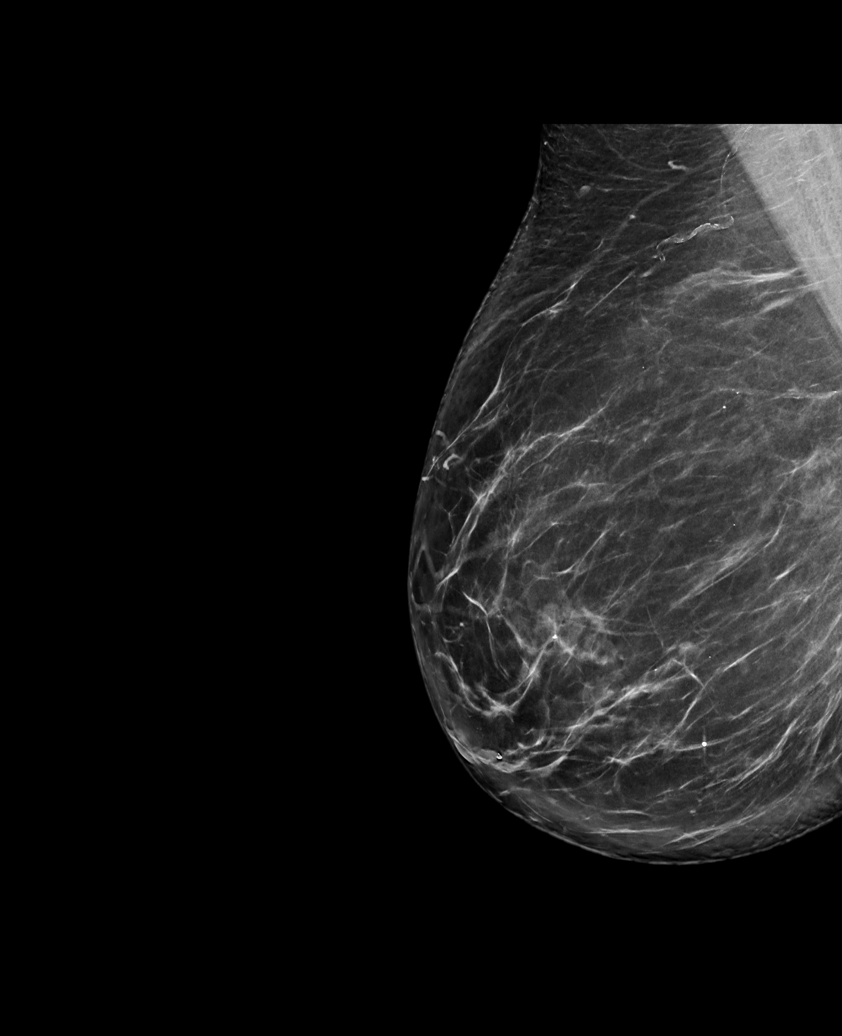

[R CC synth-2D]
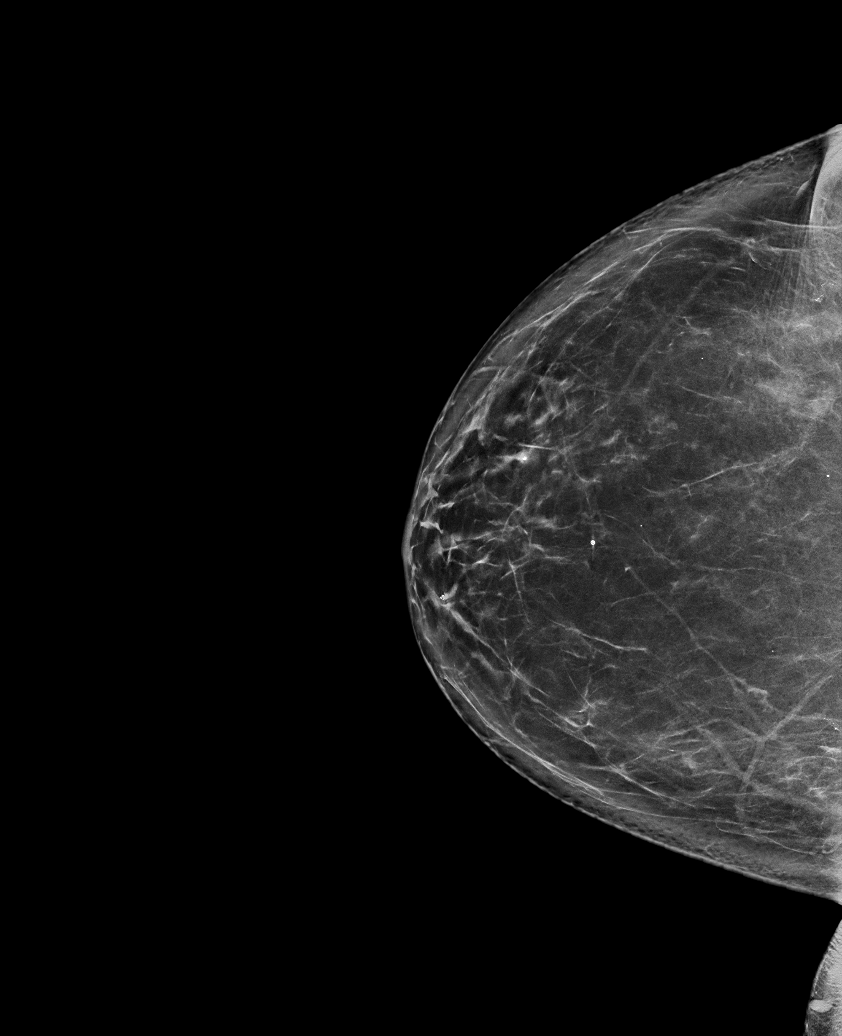

[L MLO tomo · tomo slice 49/97.0]
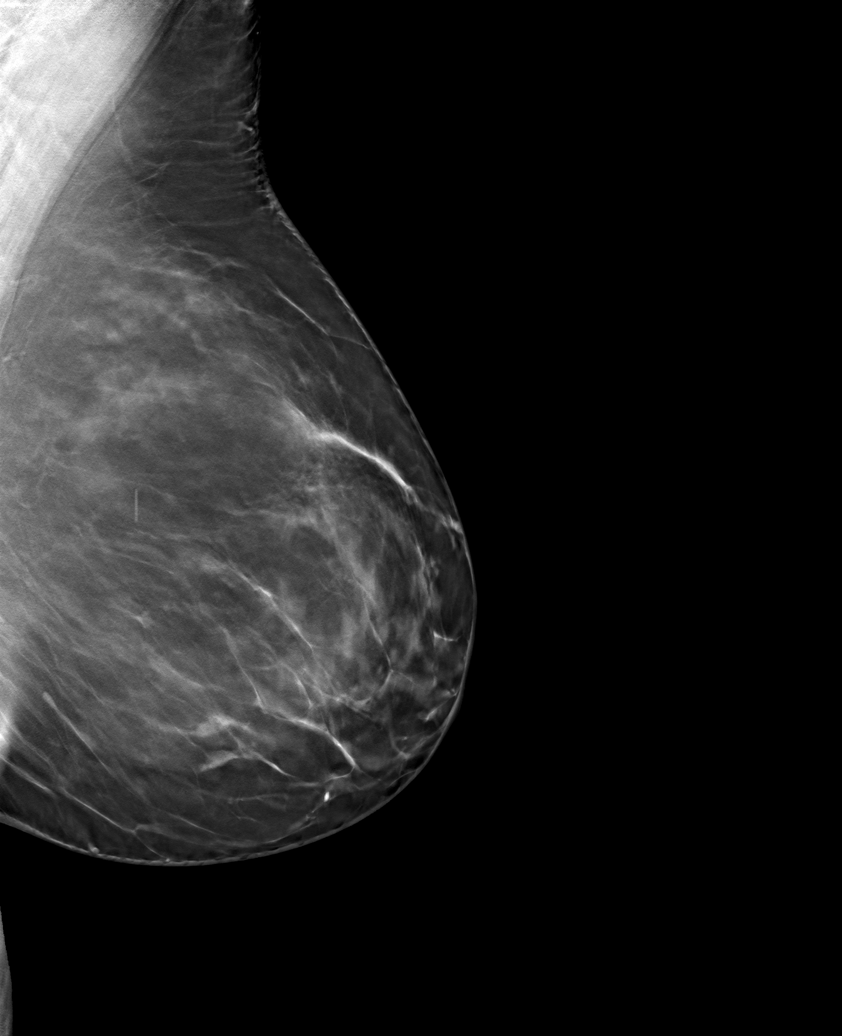

[6 of 30 positions shown; findings below may reference images not displayed]

ACR Breast Density Category b: There are scattered areas of
fibroglandular density.
FINDINGS: There are no findings suspicious for malignancy. Images were
processed with CAD.
IMPRESSION: No mammographic evidence of malignancy. A result letter of this
screening mammogram will be mailed directly to the patient.

RECOMMENDATION:
Screening mammogram in one year. (Code:[id number])

BI-RADS CATEGORY  1: Negative.

## 2020-01-14 ENCOUNTER — Ambulatory Visit: Payer: Medicare Other | Attending: Internal Medicine

## 2020-01-14 DIAGNOSIS — Z23 Encounter for immunization: Secondary | ICD-10-CM | POA: Insufficient documentation

## 2020-01-14 NOTE — Progress Notes (Signed)
   Covid-19 Vaccination Clinic  Name:  JOSCLYN ROSALES    MRN: [id number] DOB: Sep 04, 1946  01/14/2020  Ms. Laughery was observed post Covid-19 immunization for 15 minutes without incidence. She was provided with Vaccine Information Sheet and instruction to access the V-Safe system.   Ms. Jakubowicz was instructed to call 911 with any severe reactions post vaccine: Marland Kitchen Difficulty breathing  . Swelling of your face and throat  . A fast heartbeat  . A bad rash all over your body  . Dizziness and weakness    Immunizations Administered    Name Date Dose VIS Date Route   Pfizer COVID-19 Vaccine 01/14/2020  8:26 AM 0.3 mL 11/04/2019 Intramuscular   Manufacturer: [employer], [employer]   Lot: [id number]   NDC: [address]-6

## 2020-01-31 DIAGNOSIS — Z0120 Encounter for dental examination and cleaning without abnormal findings: Secondary | ICD-10-CM | POA: Diagnosis not present

## 2020-02-07 ENCOUNTER — Ambulatory Visit: Payer: Medicare Other | Attending: Internal Medicine

## 2020-02-07 DIAGNOSIS — Z23 Encounter for immunization: Secondary | ICD-10-CM

## 2020-02-07 NOTE — Progress Notes (Signed)
   Covid-19 Vaccination Clinic  Name:  Bianca Ware    MRN: [id number] DOB: [birth year]  02/07/2020  Bianca Ware was observed post Covid-19 immunization for 15 minutes without incident. She was provided with Vaccine Information Sheet and instruction to access the V-Safe system.   Bianca Ware was instructed to call 911 with any severe reactions post vaccine: Marland Kitchen Difficulty breathing  . Swelling of face and throat  . A fast heartbeat  . A bad rash all over body  . Dizziness and weakness   Immunizations Administered    Name Date Dose VIS Date Route   Pfizer COVID-19 Vaccine 02/07/2020  8:21 AM 0.3 mL 11/04/2019 Intramuscular   Manufacturer: [employer], [employer]   Lot: [id number]   NDC: [address]-8

## 2020-06-22 ENCOUNTER — Other Ambulatory Visit: Payer: Self-pay | Admitting: Internal Medicine

## 2020-06-22 DIAGNOSIS — Z1231 Encounter for screening mammogram for malignant neoplasm of breast: Secondary | ICD-10-CM

## 2020-07-02 ENCOUNTER — Telehealth: Payer: Self-pay | Admitting: Orthopedic Surgery

## 2020-07-02 NOTE — Telephone Encounter (Signed)
Bianca Ware would like Dr. Magnus Ivan to put in an order for a gel injection left knee.  She can be reached at [phone].

## 2020-07-02 NOTE — Telephone Encounter (Signed)
Can you please advise?

## 2020-07-03 ENCOUNTER — Telehealth: Payer: Self-pay

## 2020-07-03 NOTE — Telephone Encounter (Signed)
Submitted for VOB for Synvisc one-left knee  

## 2020-07-03 NOTE — Telephone Encounter (Signed)
Approved for Synvisc One-Left knee Dr. Kathrin Greathouse and Bill $45 copay  20% OOP No prior auth required     [facility] to schedule @ next available

## 2020-07-03 NOTE — Telephone Encounter (Signed)
Can you please help with this ?

## 2020-07-04 NOTE — Telephone Encounter (Signed)
Called and scheduled pt. Pt is aware of copay °

## 2020-07-16 ENCOUNTER — Ambulatory Visit (INDEPENDENT_AMBULATORY_CARE_PROVIDER_SITE_OTHER): Payer: Medicare Other | Admitting: Physician Assistant

## 2020-07-16 ENCOUNTER — Encounter: Payer: Self-pay | Admitting: Physician Assistant

## 2020-07-16 DIAGNOSIS — M1712 Unilateral primary osteoarthritis, left knee: Secondary | ICD-10-CM | POA: Diagnosis not present

## 2020-07-16 MED ORDER — LIDOCAINE HCL 1 % IJ SOLN
0.5000 mL | INTRAMUSCULAR | Status: AC | PRN
  Administered 2020-07-16: .5 mL

## 2020-07-16 MED ORDER — HYLAN G-F 20 48 MG/6ML IX SOSY
48.0000 mg | PREFILLED_SYRINGE | INTRA_ARTICULAR | Status: AC | PRN
  Administered 2020-07-16: 48 mg via INTRA_ARTICULAR

## 2020-07-16 NOTE — Progress Notes (Signed)
   Procedure Note  Patient: Bianca Ware             Date of Birth: February 23, 1946           MRN: [id number]             Visit Date: 07/16/2020  HPI: Mrs. Bianca Ware is well-known to Dr. Eliberto Ivory service comes in today for a Synvisc 1 injection.  She denies any new injury to the knee.  Previous injection June 202020 was helpful.  She has known osteoarthritis of the left knee.  Physical exam: Left knee no abnormal warmth erythema.  Tenderness along the medial and lateral joint line.  Tello femoral crepitus with passive range of motion.  No instability.  Procedures: Visit Diagnoses:  1. Unilateral primary osteoarthritis, left knee     Large Joint Inj on 07/16/2020 1:46 PM Indications: pain Details: 22 G 1.5 in needle, anterolateral approach  Arthrogram: No  Medications: 0.5 mL lidocaine 1 %; 48 mg Hylan 48 MG/6ML Outcome: tolerated well, no immediate complications Procedure, treatment alternatives, risks and benefits explained, specific risks discussed. Consent was given by the patient. Immediately prior to procedure a time out was called to verify the correct patient, procedure, equipment, support staff and site/side marked as required. Patient was prepped and draped in the usual sterile fashion.     Plan: She will continue to work on quad strengthening.  Follow-up with [place] as needed in regards to the left knee.  Questions encouraged and answered.

## 2020-07-24 ENCOUNTER — Other Ambulatory Visit: Payer: Self-pay

## 2020-07-24 ENCOUNTER — Ambulatory Visit
Admission: RE | Admit: 2020-07-24 | Discharge: 2020-07-24 | Disposition: A | Discharge status: Home / Self Care | Payer: Medicare Other | Source: Ambulatory Visit | Attending: Internal Medicine | Admitting: Internal Medicine

## 2020-07-24 DIAGNOSIS — Z1231 Encounter for screening mammogram for malignant neoplasm of breast: Secondary | ICD-10-CM

## 2020-08-06 DIAGNOSIS — Z0120 Encounter for dental examination and cleaning without abnormal findings: Secondary | ICD-10-CM | POA: Diagnosis not present

## 2020-08-25 DIAGNOSIS — Z23 Encounter for immunization: Secondary | ICD-10-CM | POA: Diagnosis not present

## 2020-09-18 DIAGNOSIS — E785 Hyperlipidemia, unspecified: Secondary | ICD-10-CM | POA: Diagnosis not present

## 2020-09-22 ENCOUNTER — Ambulatory Visit: Payer: Medicare Other | Attending: Internal Medicine

## 2020-09-22 DIAGNOSIS — Z23 Encounter for immunization: Secondary | ICD-10-CM

## 2020-09-22 NOTE — Progress Notes (Signed)
   Covid-19 Vaccination Clinic  Name:  BIANCA RANERI    MRN: [id number] DOB: May 20, 1946  09/22/2020  Ms. Nordgren was observed post Covid-19 immunization for 15 minutes without incident. She was provided with Vaccine Information Sheet and instruction to access the V-Safe system.   Ms. Jeon was instructed to call 911 with any severe reactions post vaccine: Marland Kitchen Difficulty breathing  . Swelling of face and throat  . A fast heartbeat  . A bad rash all over body  . Dizziness and weakness

## 2020-09-26 DIAGNOSIS — Z0000 Encounter for general adult medical examination without abnormal findings: Secondary | ICD-10-CM | POA: Diagnosis not present

## 2020-09-26 DIAGNOSIS — E785 Hyperlipidemia, unspecified: Secondary | ICD-10-CM | POA: Diagnosis not present

## 2020-09-26 DIAGNOSIS — E669 Obesity, unspecified: Secondary | ICD-10-CM | POA: Diagnosis not present

## 2020-09-26 DIAGNOSIS — R82998 Other abnormal findings in urine: Secondary | ICD-10-CM | POA: Diagnosis not present

## 2020-09-26 DIAGNOSIS — M1990 Unspecified osteoarthritis, unspecified site: Secondary | ICD-10-CM | POA: Diagnosis not present

## 2020-10-23 DIAGNOSIS — Z1212 Encounter for screening for malignant neoplasm of rectum: Secondary | ICD-10-CM | POA: Diagnosis not present

## 2020-10-31 DIAGNOSIS — Z961 Presence of intraocular lens: Secondary | ICD-10-CM | POA: Diagnosis not present

## 2020-10-31 DIAGNOSIS — H25811 Combined forms of age-related cataract, right eye: Secondary | ICD-10-CM | POA: Diagnosis not present

## 2021-03-26 ENCOUNTER — Other Ambulatory Visit (HOSPITAL_BASED_OUTPATIENT_CLINIC_OR_DEPARTMENT_OTHER): Payer: Self-pay

## 2021-03-26 ENCOUNTER — Ambulatory Visit: Payer: Medicare Other | Attending: Internal Medicine

## 2021-03-26 ENCOUNTER — Other Ambulatory Visit: Payer: Self-pay

## 2021-03-26 DIAGNOSIS — Z23 Encounter for immunization: Secondary | ICD-10-CM

## 2021-03-26 MED ORDER — PFIZER-BIONT COVID-19 VAC-TRIS 30 MCG/0.3ML IM SUSP
INTRAMUSCULAR | 0 refills | Status: DC
  Filled 2021-03-26: qty 0.3, 1d supply, fill #0

## 2021-03-26 NOTE — Progress Notes (Signed)
   Covid-19 Vaccination Clinic  Name:  Bianca Ware    MRN: [id number] DOB: [birth year]  03/26/2021  [name] was observed post Covid-19 immunization for 15 minutes without incident. She was provided with Vaccine Information Sheet and instruction to access the V-Safe system.   [name] was instructed to call 911 with any severe reactions post vaccine: Marland Kitchen Difficulty breathing  . Swelling of face and throat  . A fast heartbeat  . A bad rash all over body  . Dizziness and weakness   Immunizations Administered    Name Date Dose VIS Date Route   PFIZER Comrnaty(Gray TOP) Covid-19 Vaccine 03/26/2021  2:07 PM 0.3 mL 11/01/2020 Intramuscular   Manufacturer: [employer], [employer]   Lot: [id number]   NDC: [phone]

## 2021-04-15 DIAGNOSIS — Z961 Presence of intraocular lens: Secondary | ICD-10-CM | POA: Diagnosis not present

## 2021-04-15 DIAGNOSIS — H25811 Combined forms of age-related cataract, right eye: Secondary | ICD-10-CM | POA: Diagnosis not present

## 2021-05-20 DIAGNOSIS — H2511 Age-related nuclear cataract, right eye: Secondary | ICD-10-CM | POA: Diagnosis not present

## 2021-05-30 DIAGNOSIS — H2511 Age-related nuclear cataract, right eye: Secondary | ICD-10-CM | POA: Diagnosis not present

## 2021-06-17 ENCOUNTER — Other Ambulatory Visit: Payer: Self-pay | Admitting: Internal Medicine

## 2021-06-17 DIAGNOSIS — Z1231 Encounter for screening mammogram for malignant neoplasm of breast: Secondary | ICD-10-CM

## 2021-08-07 ENCOUNTER — Other Ambulatory Visit: Payer: Self-pay

## 2021-08-07 ENCOUNTER — Ambulatory Visit
Admission: RE | Admit: 2021-08-07 | Discharge: 2021-08-07 | Disposition: A | Discharge status: Home / Self Care | Payer: Medicare Other | Source: Ambulatory Visit | Attending: Internal Medicine | Admitting: Internal Medicine

## 2021-08-07 DIAGNOSIS — Z1231 Encounter for screening mammogram for malignant neoplasm of breast: Secondary | ICD-10-CM

## 2021-09-14 DIAGNOSIS — Z23 Encounter for immunization: Secondary | ICD-10-CM | POA: Diagnosis not present

## 2021-09-19 ENCOUNTER — Telehealth: Payer: Self-pay | Admitting: Physician Assistant

## 2021-09-19 ENCOUNTER — Telehealth: Payer: Self-pay

## 2021-09-19 NOTE — Telephone Encounter (Signed)
Pt calling to get another gel inj in her left knee, previous inj was August 2021 with Bronson Curb. Will pt be able to get that inj at the appt she has set up for 09/30/21 or will she need to be checked out and have the injs submitted through her ins. Pt also coming in to that appt to get left ankle examined as well. The best call back number is [phone].

## 2021-09-19 NOTE — Telephone Encounter (Signed)
Yes ma'am. Submitted.

## 2021-09-19 NOTE — Telephone Encounter (Signed)
Submitted for SynviscOne, left knee. Pending BV 

## 2021-09-19 NOTE — Telephone Encounter (Signed)
Do you think this can get authorized in time?

## 2021-09-24 ENCOUNTER — Ambulatory Visit: Payer: Medicare Other | Attending: Internal Medicine

## 2021-09-24 ENCOUNTER — Telehealth: Payer: Self-pay

## 2021-09-24 ENCOUNTER — Other Ambulatory Visit (HOSPITAL_BASED_OUTPATIENT_CLINIC_OR_DEPARTMENT_OTHER): Payer: Self-pay

## 2021-09-24 DIAGNOSIS — Z23 Encounter for immunization: Secondary | ICD-10-CM

## 2021-09-24 MED ORDER — PFIZER COVID-19 VAC BIVALENT 30 MCG/0.3ML IM SUSP
INTRAMUSCULAR | 0 refills | Status: DC
  Filled 2021-09-24: qty 0.3, 1d supply, fill #0

## 2021-09-24 NOTE — Telephone Encounter (Signed)
Approved for SynviscOne, left knee. Buy & Bill Patient will be responsible for 20% OOP. Co-pay of $25.00 No PA required  Appt. 09/30/2021 with Richardean Canal

## 2021-09-24 NOTE — Progress Notes (Signed)
   Covid-19 Vaccination Clinic  Name:  Bianca Ware    MRN: [id number] DOB: Dec 10, 1945  09/24/2021  [name] was observed post Covid-19 immunization for 15 minutes without incident. She was provided with Vaccine Information Sheet and instruction to access the V-Safe system.   [name] was instructed to call 911 with any severe reactions post vaccine: Difficulty breathing  Swelling of face and throat  A fast heartbeat  A bad rash all over body  Dizziness and weakness   Immunizations Administered     Name Date Dose VIS Date Route   Pfizer Covid-19 Vaccine Bivalent Booster 09/24/2021  2:11 PM 0.3 mL 07/24/2021 Intramuscular   Manufacturer: [employer], [employer]   Lot: [id number]   NDC: [phone]

## 2021-09-30 ENCOUNTER — Encounter: Payer: Self-pay | Admitting: Physician Assistant

## 2021-09-30 ENCOUNTER — Ambulatory Visit: Payer: Medicare Other | Admitting: Physician Assistant

## 2021-09-30 ENCOUNTER — Ambulatory Visit: Payer: Self-pay

## 2021-09-30 DIAGNOSIS — M25572 Pain in left ankle and joints of left foot: Secondary | ICD-10-CM | POA: Diagnosis not present

## 2021-09-30 DIAGNOSIS — M1712 Unilateral primary osteoarthritis, left knee: Secondary | ICD-10-CM

## 2021-09-30 DIAGNOSIS — R2242 Localized swelling, mass and lump, left lower limb: Secondary | ICD-10-CM | POA: Diagnosis not present

## 2021-09-30 MED ORDER — LIDOCAINE HCL 1 % IJ SOLN
2.0000 mL | INTRAMUSCULAR | Status: AC | PRN
  Administered 2021-09-30: 2 mL

## 2021-09-30 MED ORDER — HYLAN G-F 20 48 MG/6ML IX SOSY
48.0000 mg | PREFILLED_SYRINGE | INTRA_ARTICULAR | Status: AC | PRN
  Administered 2021-09-30: 48 mg via INTRA_ARTICULAR

## 2021-09-30 NOTE — Progress Notes (Signed)
Office Visit Note   Patient: Bianca Ware           Date of Birth: 12-Nov-1946           MRN: [id number] Visit Date: 09/30/2021              Requested by: Chilton Greathouse, MD [address] [place],  [place] [address] PCP: Chilton Greathouse, MD   Assessment & Plan: Visit Diagnoses:  1. Pain in left ankle and joints of left foot   2. Ankle mass, left     Plan: Given the fact that she has left ankle swelling and there is a mobile mass in this area recommend MRI with and without contrast to evaluate the mass.  Questions were encouraged and answered at length.  This to be an open MRI due to the fact the patient is claustrophobic.  She will follow-up after the MRI of her ankle to go over the results and discuss further treatment.  Follow-Up Instructions: Return After MRI.   Orders:  Orders Placed This Encounter  Procedures   Large Joint Inj   XR Ankle Complete Left   No orders of the defined types were placed in this encounter.     Procedures: Large Joint Inj: L knee on 09/30/2021 11:49 AM Indications: pain Details: 22 G 1.5 in needle, anterolateral approach  Arthrogram: No  Medications: 2 mL lidocaine 1 %; 48 mg Hylan 48 MG/6ML Outcome: tolerated well, no immediate complications Procedure, treatment alternatives, risks and benefits explained, specific risks discussed. Consent was given by the patient. Immediately prior to procedure a time out was called to verify the correct patient, procedure, equipment, support staff and site/side marked as required. Patient was prepped and draped in the usual sterile fashion.      Clinical Data: No additional findings.   Subjective: Chief Complaint  Patient presents with   Left Knee - Pain, Follow-up   Left Ankle - Pain    HPI Bianca Ware comes in today for Synvisc 1 injection left knee.  She has known osteoarthritis of her left knee.  She has no upcoming surgery for the knee.  Previous Synvisc 1 injections have been  beneficial.  She also is asking about her left ankle she states she has had swelling of the left ankle for years.  Over the last 3 weeks she has had more swelling and points to the lateral aspect of ankle no known injury.  She states the swelling comes and goes.  She alternates between Tylenol and ibuprofen and has tried Voltaren gel without any real relief.  Its painful when walking. Review of Systems See HPI otherwise negative  Objective: Vital Signs: There were no vitals taken for this visit.  Physical Exam General: Well-developed well-nourished female no acute distress mood affect appropriate Ortho Exam Bilateral ankles good range of motion both ankles.  No pain with dorsiflexion plantarflexion inversion eversion.  She has 5 out of 5 strength with left ankle with inversion eversion against resistance.  Has a mass just distal to the lateral malleolus with no abnormal warmth erythema.  There is no skin breakdown.  Slight mass to the right ankle but definitely larger on the left over the lateral aspect of both ankles.  Differential lipoma versus ganglion cyst Specialty Comments:  No specialty comments available.  Imaging: No results found.   PMFS History: Patient Active Problem List   Diagnosis Date Noted   Status post total knee replacement, right 10/01/2018   Chronic pain of left  knee 08/11/2018   Unilateral primary osteoarthritis, left knee 08/11/2018   Chronic pain of right knee 02/22/2018   Unilateral primary osteoarthritis, right knee 02/22/2018   Osteoarthritis of left hip 09/07/2015   Status post total replacement of left hip 09/07/2015   Lumbar disc herniation with radiculopathy 05/29/2015    Class: Acute   Spondylolisthesis of lumbar region 05/29/2015    Class: Chronic   Past Medical History:  Diagnosis Date   Arthritis    hands   Complication of anesthesia    waking up   OA (osteoarthritis) of knee    right and left   PONV (postoperative nausea and vomiting)     Seasonal allergies    Wears glasses     Family History  Problem Relation Age of Onset   Breast cancer Sister     Past Surgical History:  Procedure Laterality Date   ABDOMINAL HYSTERECTOMY  1970s   BLADDER SUSPENSION  2002   w/ urethral sling   CATARACT EXTRACTION W/ INTRAOCULAR LENS IMPLANT Left 2018   CESAREAN SECTION  x2   yrs ago   DILATION AND CURETTAGE OF UTERUS  1960s   LUMBAR LAMINECTOMY N/A 05/29/2015   Procedure: LEFT L4-5 MICRODISCECTOMY;  Surgeon: Kerrin Champagne, MD;  Location: [facility] OR;  Service: Orthopedics;  Laterality: N/A;   TONSILLECTOMY  child   TOTAL HIP ARTHROPLASTY Left 09/07/2015   Procedure: LEFT TOTAL HIP ARTHROPLASTY ANTERIOR APPROACH;  Surgeon: Kathryne Hitch, MD;  Location: [facility];  Service: Orthopedics;  Laterality: Left;   TOTAL KNEE ARTHROPLASTY Right 10/01/2018   Procedure: RIGHT TOTAL KNEE ARTHROPLASTY;  Surgeon: Kathryne Hitch, MD;  Location: [facility];  Service: Orthopedics;  Laterality: Right;   TUBAL LIGATION Bilateral yrs ago   Social History   Occupational History   Not on file  Tobacco Use   Smoking status: Former    Packs/day: 0.25    Years: 15.00    Pack years: 3.75    Types: Cigarettes    Quit date: 05/24/1977    Years since quitting: 44.3   Smokeless tobacco: Never  Vaping Use   Vaping Use: Never used  Substance and Sexual Activity   Alcohol use: No   Drug use: Never   Sexual activity: Not on file

## 2021-09-30 NOTE — Addendum Note (Signed)
Addended by: Barbette Or on: 09/30/2021 04:29 PM   Modules accepted: Orders

## 2021-10-08 ENCOUNTER — Telehealth: Payer: Self-pay | Admitting: Physician Assistant

## 2021-10-08 NOTE — Telephone Encounter (Signed)
Called patient left message to return call to schedule an appointment for MRI review with Bronson Curb

## 2021-10-16 DIAGNOSIS — E785 Hyperlipidemia, unspecified: Secondary | ICD-10-CM | POA: Diagnosis not present

## 2021-10-23 DIAGNOSIS — Z1212 Encounter for screening for malignant neoplasm of rectum: Secondary | ICD-10-CM | POA: Diagnosis not present

## 2021-10-23 DIAGNOSIS — R82998 Other abnormal findings in urine: Secondary | ICD-10-CM | POA: Diagnosis not present

## 2021-10-23 DIAGNOSIS — M1990 Unspecified osteoarthritis, unspecified site: Secondary | ICD-10-CM | POA: Diagnosis not present

## 2021-10-23 DIAGNOSIS — Z0000 Encounter for general adult medical examination without abnormal findings: Secondary | ICD-10-CM | POA: Diagnosis not present

## 2021-10-23 DIAGNOSIS — E669 Obesity, unspecified: Secondary | ICD-10-CM | POA: Diagnosis not present

## 2021-10-23 DIAGNOSIS — E785 Hyperlipidemia, unspecified: Secondary | ICD-10-CM | POA: Diagnosis not present

## 2021-10-26 ENCOUNTER — Other Ambulatory Visit: Payer: Self-pay

## 2021-10-26 ENCOUNTER — Ambulatory Visit
Admission: RE | Admit: 2021-10-26 | Discharge: 2021-10-26 | Disposition: A | Discharge status: Home / Self Care | Payer: Medicare Other | Source: Ambulatory Visit | Attending: Physician Assistant | Admitting: Physician Assistant

## 2021-10-26 DIAGNOSIS — M25572 Pain in left ankle and joints of left foot: Secondary | ICD-10-CM

## 2021-10-26 DIAGNOSIS — M25472 Effusion, left ankle: Secondary | ICD-10-CM | POA: Diagnosis not present

## 2021-10-26 DIAGNOSIS — R2242 Localized swelling, mass and lump, left lower limb: Secondary | ICD-10-CM

## 2021-10-26 DIAGNOSIS — M19072 Primary osteoarthritis, left ankle and foot: Secondary | ICD-10-CM | POA: Diagnosis not present

## 2021-10-26 DIAGNOSIS — M7989 Other specified soft tissue disorders: Secondary | ICD-10-CM | POA: Diagnosis not present

## 2021-10-26 DIAGNOSIS — R600 Localized edema: Secondary | ICD-10-CM | POA: Diagnosis not present

## 2021-10-26 MED ORDER — GADOBENATE DIMEGLUMINE 529 MG/ML IV SOLN
15.0000 mL | Freq: Once | INTRAVENOUS | Status: AC | PRN
  Administered 2021-10-26: 15 mL via INTRAVENOUS

## 2021-10-31 ENCOUNTER — Encounter: Payer: Self-pay | Admitting: Orthopaedic Surgery

## 2021-10-31 ENCOUNTER — Ambulatory Visit (INDEPENDENT_AMBULATORY_CARE_PROVIDER_SITE_OTHER): Payer: Medicare Other | Admitting: Orthopaedic Surgery

## 2021-10-31 DIAGNOSIS — M25572 Pain in left ankle and joints of left foot: Secondary | ICD-10-CM | POA: Diagnosis not present

## 2021-10-31 NOTE — Progress Notes (Signed)
The patient comes in today to go over MRI of her left ankle.  She had been having left ankle pain with some of this may have been compensatory type of pain as she was getting over on her arthritic left knee.  Since she had a Synvisc 1 injection recently that is helped her ankle quite a bit.  She was sent for MRI of the left ankle because she had a large fluid collection just anterior to the fibula and some posterior and this was concerning for a mass.  She is feeling much better with her ankle now given that the knee is calming down.  Synvisc 1 injections have helped for a long period time in the past for her.  On examination of her left ankle she has full range of motion of left ankle.  There is no effusion.  There is fullness in the soft tissue just anterior to the fibula and we have seen this before when someone has just a touch of synovitis and some of this is just an anatomic variant.  MRI does not show any mass or cyst at all with the left ankle.  There is some slight synovitis.  The ligaments and tendons are all intact.  There is some midfoot arthritic changes.  The area where there is fullness shows normal skin and fascia and muscle with no mass.  I gave her reassurance that there is nothing worrisome for her ankle.  I think some of her pain comes from her knee on that left side and this affected her ankle as she what was different.  She is doing much better overall.  She has good supportive shoes.  All question concerns were answered addressed.  Follow-up can be as needed.

## 2021-12-24 DIAGNOSIS — Z961 Presence of intraocular lens: Secondary | ICD-10-CM | POA: Diagnosis not present

## 2022-04-17 ENCOUNTER — Ambulatory Visit: Payer: Medicare Other | Admitting: Orthopaedic Surgery

## 2022-04-17 ENCOUNTER — Ambulatory Visit (INDEPENDENT_AMBULATORY_CARE_PROVIDER_SITE_OTHER): Payer: Medicare Other

## 2022-04-17 ENCOUNTER — Encounter: Payer: Self-pay | Admitting: Orthopaedic Surgery

## 2022-04-17 DIAGNOSIS — Z96651 Presence of right artificial knee joint: Secondary | ICD-10-CM

## 2022-04-17 DIAGNOSIS — M25551 Pain in right hip: Secondary | ICD-10-CM | POA: Diagnosis not present

## 2022-04-17 NOTE — Progress Notes (Signed)
The patient is a 76 year old female well-known to me.  We replaced her left hip many years ago and then her right knee in 2019.  She is very active and about 2 to 3 weeks ago started develop some pain and a sensation like her leg is going to give out on the right side.  However the pain seems to be in her groin and it radiates down to her knee.  Past the knee she has no issues.  She is incredibly active and young appearing.  Her right hip does move smoothly and fluidly but has significant pain in the groin with the extremes of rotation and you can tell is painful to her.  Her right knee has good range of motion no swelling.  There is some slight laxity to the knee but there is no hip that is an issue.  An AP pelvis and lateral right hip compared to films from 2016 show significant arthritis of the right hip there was not present 6 years ago.  There is significant joint space narrowing and para-articular osteophytes.  Since this pain has been more acute for her, I have recommended hip abduction exercises to do twice daily as well as sending her for an intra-articular steroid injection under direct fluoroscopy in her right hip joint by Dr. Alvester Morin.  She agrees with this treatment plan.  Once she has that injection I would like him to get her back to me about 2 weeks later.  All question concerns were answered and addressed.  I recommended a cane or opposite hand to offload that hip for the short-term.

## 2022-04-22 ENCOUNTER — Other Ambulatory Visit: Payer: Self-pay

## 2022-04-22 DIAGNOSIS — M25551 Pain in right hip: Secondary | ICD-10-CM

## 2022-05-22 ENCOUNTER — Encounter: Payer: Self-pay | Admitting: Physical Medicine and Rehabilitation

## 2022-05-22 ENCOUNTER — Ambulatory Visit: Payer: Medicare Other | Admitting: Physical Medicine and Rehabilitation

## 2022-05-22 ENCOUNTER — Ambulatory Visit: Payer: Self-pay

## 2022-05-22 DIAGNOSIS — M25551 Pain in right hip: Secondary | ICD-10-CM

## 2022-05-22 NOTE — Progress Notes (Signed)
   Bianca Ware - 76 y.o. female MRN [id number]  Date of birth: 03-30-1946  Office Visit Note: Visit Date: 05/22/2022 PCP: Chilton Greathouse, MD Referred by: Chilton Greathouse, MD  Subjective: Chief Complaint  Patient presents with   Right Hip - Pain   HPI:  Bianca Ware is a 76 y.o. female who comes in today at the request of Dr. Doneen Poisson for planned Right anesthetic hip arthrogram with fluoroscopic guidance.  The patient has failed conservative care including home exercise, medications, time and activity modification.  This injection will be diagnostic and hopefully therapeutic.  Please see requesting physician notes for further details and justification.   ROS Otherwise per HPI.  Assessment & Plan: Visit Diagnoses:    ICD-10-CM   1. Pain in right hip  M25.551 Large Joint Inj: R hip joint    XR C-ARM NO REPORT      Plan: No additional findings.   Meds & Orders: No orders of the defined types were placed in this encounter.   Orders Placed This Encounter  Procedures   Large Joint Inj: R hip joint   XR C-ARM NO REPORT    Follow-up: Return for visit to requesting provider as needed.   Procedures: Large Joint Inj: R hip joint on 05/22/2022 10:02 AM Indications: diagnostic evaluation and pain Details: 22 G 3.5 in needle, fluoroscopy-guided anterior approach  Arthrogram: No  Medications: 4 mL bupivacaine 0.25 %; 60 mg triamcinolone acetonide 40 MG/ML Outcome: tolerated well, no immediate complications  There was excellent flow of contrast producing a partial arthrogram of the hip. The patient did have relief of symptoms during the anesthetic phase of the injection. Procedure, treatment alternatives, risks and benefits explained, specific risks discussed. Consent was given by the patient. Immediately prior to procedure a time out was called to verify the correct patient, procedure, equipment, support staff and site/side marked as required. Patient was prepped  and draped in the usual sterile fashion.          Clinical History: No specialty comments available.     Objective:  VS:  HT:    WT:   BMI:     BP:   HR: bpm  TEMP: ( )  RESP:  Physical Exam   Imaging: No results found.

## 2022-05-22 NOTE — Progress Notes (Signed)
Pt state right hip pain. Pt state walking, sitting and standing makes the pain worse. Pt state she uses pain cream and over the counter pain meds to help ease her pain.  Numeric Pain Rating Scale and Functional Assessment Average Pain 2   In the last MONTH (on 0-10 scale) has pain interfered with the following?  1. General activity like being  able to carry out your everyday physical activities such as walking, climbing stairs, carrying groceries, or moving a chair?  Rating(5)    -BT, -Dye Allergies.

## 2022-06-05 ENCOUNTER — Encounter: Payer: Self-pay | Admitting: Orthopaedic Surgery

## 2022-06-05 ENCOUNTER — Ambulatory Visit: Payer: Medicare Other | Admitting: Orthopaedic Surgery

## 2022-06-05 DIAGNOSIS — M25551 Pain in right hip: Secondary | ICD-10-CM

## 2022-06-05 DIAGNOSIS — M1611 Unilateral primary osteoarthritis, right hip: Secondary | ICD-10-CM | POA: Diagnosis not present

## 2022-06-05 NOTE — Progress Notes (Signed)
The patient is well-known to me.  She returns for follow-up after having an intra-articular steroid injection in her right hip by Dr. Alvester Morin.  She said even a few minutes after the injection her pain was gone so is definitely hip issue.  Her x-rays also show worsening arthritis of the right hip.  We replaced her left hip remotely.  Right now she states she is doing well and much better.  Her pain came on more acutely with the right hip.  The right hip is moving much better today and feels better overall.  She is walking without a limp.  She is a very active and young appearing 3.  She knows to wait at least 4 to 5 months between steroid injections in the hip.  If her pain comes back more acutely I would always have her consider hip replacement for that right side.  All question concerns were answered and addressed.  Follow-up for now is as needed.

## 2022-06-17 MED ORDER — TRIAMCINOLONE ACETONIDE 40 MG/ML IJ SUSP
60.0000 mg | INTRAMUSCULAR | Status: AC | PRN
Start: 2022-05-22 — End: 2022-05-22
  Administered 2022-05-22: 60 mg via INTRA_ARTICULAR

## 2022-06-17 MED ORDER — BUPIVACAINE HCL 0.25 % IJ SOLN
4.0000 mL | INTRAMUSCULAR | Status: AC | PRN
  Administered 2022-05-22: 4 mL via INTRA_ARTICULAR

## 2022-06-30 ENCOUNTER — Other Ambulatory Visit: Payer: Self-pay | Admitting: Internal Medicine

## 2022-06-30 DIAGNOSIS — Z1231 Encounter for screening mammogram for malignant neoplasm of breast: Secondary | ICD-10-CM

## 2022-08-08 ENCOUNTER — Ambulatory Visit
Admission: RE | Admit: 2022-08-08 | Discharge: 2022-08-08 | Disposition: A | Discharge status: Home / Self Care | Payer: Medicare Other | Source: Ambulatory Visit | Attending: Internal Medicine | Admitting: Internal Medicine

## 2022-08-08 DIAGNOSIS — Z1231 Encounter for screening mammogram for malignant neoplasm of breast: Secondary | ICD-10-CM | POA: Diagnosis not present

## 2022-08-11 ENCOUNTER — Telehealth: Payer: Self-pay | Admitting: Physician Assistant

## 2022-08-11 NOTE — Telephone Encounter (Signed)
Pt called and would like to repeat gel injection for L knee

## 2022-08-12 NOTE — Telephone Encounter (Signed)
VOB submitted for SynviscOne, left knee  

## 2022-08-19 ENCOUNTER — Other Ambulatory Visit: Payer: Self-pay

## 2022-08-19 DIAGNOSIS — M1712 Unilateral primary osteoarthritis, left knee: Secondary | ICD-10-CM

## 2022-08-25 ENCOUNTER — Encounter: Payer: Self-pay | Admitting: Physician Assistant

## 2022-08-25 ENCOUNTER — Ambulatory Visit: Payer: Medicare Other | Admitting: Physician Assistant

## 2022-08-25 DIAGNOSIS — M1611 Unilateral primary osteoarthritis, right hip: Secondary | ICD-10-CM

## 2022-08-25 MED ORDER — HYLAN G-F 20 48 MG/6ML IX SOSY
48.0000 mg | PREFILLED_SYRINGE | INTRA_ARTICULAR | Status: AC | PRN
  Administered 2022-08-25: 48 mg via INTRA_ARTICULAR

## 2022-08-25 MED ORDER — METHOCARBAMOL 500 MG PO TABS: ORAL_TABLET | ORAL | 0 refills | Status: DC

## 2022-08-25 MED ORDER — HYALURONAN 88 MG/4ML IX SOSY
88.0000 mg | PREFILLED_SYRINGE | INTRA_ARTICULAR | Status: AC | PRN
  Administered 2022-08-25: 88 mg via INTRA_ARTICULAR

## 2022-08-25 MED ORDER — LIDOCAINE HCL 1 % IJ SOLN
3.0000 mL | INTRAMUSCULAR | Status: AC | PRN
  Administered 2022-08-25: 3 mL

## 2022-08-25 NOTE — Progress Notes (Signed)
   Procedure Note  Patient: Bianca Ware             Date of Birth: 10-18-1946           MRN: [id number]             Visit Date: 08/25/2022 HPI: Mrs. Bohnsack comes in today for scheduled Synvisc 1 injection left knee.  She states she has had no changes in overall pain in the knee.  She does mention that she has night cramps in both legs.  She states that she had no injury to either leg.  Notes that Robaxin in the past does help with the night cramps.  She has known osteoarthritis in her left.  She has no upcoming planned surgery on the left knee in the next 6 months.   Review of systems: See HPI negative or noncontributory.  Physical exam: General well-developed well-nourished female no acute distress.  Left knee: Full extension full flexion.  No abnormal warmth erythema slight effusion. Procedures: Visit Diagnoses:  1. Unilateral primary osteoarthritis, right hip     Large Joint Inj: L knee on 08/25/2022 4:20 PM Indications: pain Details: 22 G 1.5 in needle, superolateral approach  Arthrogram: No  Medications: 88 mg Hyaluronan 88 MG/4ML; 48 mg Hylan 48 MG/6ML; 3 mL lidocaine 1 % Aspirate: 5 mL yellow Outcome: tolerated well, no immediate complications Procedure, treatment alternatives, risks and benefits explained, specific risks discussed. Consent was given by the patient. Immediately prior to procedure a time out was called to verify the correct patient, procedure, equipment, support staff and site/side marked as required. Patient was prepped and draped in the usual sterile fashion.     Plan: She knows to wait least 6 months between supplemental injections.  She will follow-up with [place] as needed questions were encouraged and answered.

## 2022-08-30 DIAGNOSIS — Z23 Encounter for immunization: Secondary | ICD-10-CM | POA: Diagnosis not present

## 2022-09-11 ENCOUNTER — Other Ambulatory Visit (HOSPITAL_BASED_OUTPATIENT_CLINIC_OR_DEPARTMENT_OTHER): Payer: Self-pay

## 2022-10-22 DIAGNOSIS — Z23 Encounter for immunization: Secondary | ICD-10-CM | POA: Diagnosis not present

## 2022-10-30 ENCOUNTER — Other Ambulatory Visit (HOSPITAL_BASED_OUTPATIENT_CLINIC_OR_DEPARTMENT_OTHER): Payer: Self-pay

## 2022-10-30 DIAGNOSIS — E785 Hyperlipidemia, unspecified: Secondary | ICD-10-CM | POA: Diagnosis not present

## 2022-10-30 MED ORDER — COMIRNATY 30 MCG/0.3ML IM SUSY
PREFILLED_SYRINGE | INTRAMUSCULAR | 0 refills | Status: DC
  Filled 2022-10-30: qty 0.3, 1d supply, fill #0

## 2022-11-06 ENCOUNTER — Telehealth: Payer: Self-pay | Admitting: Physical Medicine and Rehabilitation

## 2022-11-06 ENCOUNTER — Other Ambulatory Visit: Payer: Self-pay | Admitting: Internal Medicine

## 2022-11-06 DIAGNOSIS — Z0000 Encounter for general adult medical examination without abnormal findings: Secondary | ICD-10-CM | POA: Diagnosis not present

## 2022-11-06 DIAGNOSIS — R0989 Other specified symptoms and signs involving the circulatory and respiratory systems: Secondary | ICD-10-CM

## 2022-11-06 DIAGNOSIS — Z1212 Encounter for screening for malignant neoplasm of rectum: Secondary | ICD-10-CM | POA: Diagnosis not present

## 2022-11-06 DIAGNOSIS — M5416 Radiculopathy, lumbar region: Secondary | ICD-10-CM | POA: Diagnosis not present

## 2022-11-06 DIAGNOSIS — R82998 Other abnormal findings in urine: Secondary | ICD-10-CM | POA: Diagnosis not present

## 2022-11-06 DIAGNOSIS — M1990 Unspecified osteoarthritis, unspecified site: Secondary | ICD-10-CM | POA: Diagnosis not present

## 2022-11-06 DIAGNOSIS — I8390 Asymptomatic varicose veins of unspecified lower extremity: Secondary | ICD-10-CM | POA: Diagnosis not present

## 2022-11-06 NOTE — Telephone Encounter (Signed)
Pt called requesting an appt for left hip injection. Please call pt at [phone]

## 2022-11-07 NOTE — Telephone Encounter (Signed)
Spoke with patient and she stated she is having the same type of pain as before, but it's not as bad. She is requesting injection in the right hip. She stated the last injection lasted 5 months. No new injury, falls or accidents. Please advise

## 2022-11-11 NOTE — Telephone Encounter (Signed)
Spoke with patient and scheduled hip injection for 11/25/22

## 2022-11-24 DIAGNOSIS — I351 Nonrheumatic aortic (valve) insufficiency: Secondary | ICD-10-CM

## 2022-11-24 HISTORY — DX: Nonrheumatic aortic (valve) insufficiency: I35.1

## 2022-11-25 ENCOUNTER — Ambulatory Visit: Payer: Self-pay

## 2022-11-25 ENCOUNTER — Ambulatory Visit
Admission: RE | Admit: 2022-11-25 | Discharge: 2022-11-25 | Disposition: A | Discharge status: Home / Self Care | Payer: Medicare Other | Source: Ambulatory Visit | Attending: Internal Medicine | Admitting: Internal Medicine

## 2022-11-25 ENCOUNTER — Ambulatory Visit (INDEPENDENT_AMBULATORY_CARE_PROVIDER_SITE_OTHER): Payer: Medicare Other | Admitting: Physical Medicine and Rehabilitation

## 2022-11-25 DIAGNOSIS — M25551 Pain in right hip: Secondary | ICD-10-CM

## 2022-11-25 DIAGNOSIS — I6523 Occlusion and stenosis of bilateral carotid arteries: Secondary | ICD-10-CM | POA: Diagnosis not present

## 2022-11-25 DIAGNOSIS — R0989 Other specified symptoms and signs involving the circulatory and respiratory systems: Secondary | ICD-10-CM

## 2022-11-25 NOTE — Progress Notes (Signed)
Functional Pain Scale - descriptive words and definitions  Mild (2)   Noticeable when not distracted/no impact on ADL's/sleep only slightly affected and able to   use both passive and active distraction for comfort. Mild range order  Average Pain 7   +Driver, -BT, -Dye Allergies.  Right hip pain.

## 2022-11-25 NOTE — Progress Notes (Signed)
   Bianca Ware - 77 y.o. female MRN [id number]  Date of birth: 05/14/1946  Office Visit Note: Visit Date: 11/25/2022 PCP: Prince Solian, MD Referred by: Prince Solian, MD  Subjective: Chief Complaint  Patient presents with   Right Hip - Pain   HPI:  Bianca Ware is a 77 y.o. female who comes in today for planned repeat Right anesthetic hip arthrogram with fluoroscopic guidance.  The patient has failed conservative care including home exercise, medications, time and activity modification. Prior injection gave more than 50% relief for several months. This injection will be diagnostic and hopefully therapeutic.  Please see requesting physician notes for further details and justification.  Referring: Dr. Jean Rosenthal   ROS Otherwise per HPI.  Assessment & Plan: Visit Diagnoses:    ICD-10-CM   1. Pain in right hip  M25.551 XR C-ARM NO REPORT      Plan: No additional findings.   Meds & Orders: No orders of the defined types were placed in this encounter.   Orders Placed This Encounter  Procedures   XR C-ARM NO REPORT    Follow-up: No follow-ups on file.   Procedures: Large Joint Inj: R hip joint on 11/25/2022 2:26 PM Indications: diagnostic evaluation and pain Details: 22 G 3.5 in needle, fluoroscopy-guided anterior approach  Arthrogram: No  Medications: 4 mL bupivacaine 0.25 %; 40 mg triamcinolone acetonide 40 MG/ML Outcome: tolerated well, no immediate complications  There was excellent flow of contrast producing a partial arthrogram of the hip. The patient did have relief of symptoms during the anesthetic phase of the injection. Procedure, treatment alternatives, risks and benefits explained, specific risks discussed. Consent was given by the patient. Immediately prior to procedure a time out was called to verify the correct patient, procedure, equipment, support staff and site/side marked as required. Patient was prepped and draped in the usual sterile  fashion.          Clinical History: No specialty comments available.     Objective:  VS:  HT:    WT:   BMI:     BP:   HR: bpm  TEMP: ( )  RESP:  Physical Exam   Imaging: No results found.

## 2022-11-30 MED ORDER — BUPIVACAINE HCL 0.25 % IJ SOLN
4.0000 mL | INTRAMUSCULAR | Status: AC | PRN
  Administered 2022-11-25: 4 mL via INTRA_ARTICULAR

## 2022-11-30 MED ORDER — TRIAMCINOLONE ACETONIDE 40 MG/ML IJ SUSP
40.0000 mg | INTRAMUSCULAR | Status: AC | PRN
  Administered 2022-11-25: 40 mg via INTRA_ARTICULAR

## 2022-12-01 DIAGNOSIS — I6523 Occlusion and stenosis of bilateral carotid arteries: Secondary | ICD-10-CM | POA: Diagnosis not present

## 2023-01-29 ENCOUNTER — Encounter: Payer: Self-pay | Admitting: Radiology

## 2023-02-05 ENCOUNTER — Telehealth: Payer: Self-pay | Admitting: Physical Medicine and Rehabilitation

## 2023-02-05 NOTE — Telephone Encounter (Signed)
Patient wanting to get an appointment for injection her hip if it is time for it.

## 2023-02-09 ENCOUNTER — Other Ambulatory Visit: Payer: Self-pay | Admitting: Physical Medicine and Rehabilitation

## 2023-02-09 DIAGNOSIS — M25551 Pain in right hip: Secondary | ICD-10-CM

## 2023-02-09 NOTE — Telephone Encounter (Signed)
Spoke with patient and she is requesting a repeat injection in right hip. She stated it is starting to bother her again. No new falls, injuries or accidents. Please advise

## 2023-02-13 DIAGNOSIS — E785 Hyperlipidemia, unspecified: Secondary | ICD-10-CM | POA: Diagnosis not present

## 2023-02-17 DIAGNOSIS — E669 Obesity, unspecified: Secondary | ICD-10-CM | POA: Diagnosis not present

## 2023-02-17 DIAGNOSIS — R82998 Other abnormal findings in urine: Secondary | ICD-10-CM | POA: Diagnosis not present

## 2023-02-17 DIAGNOSIS — N951 Menopausal and female climacteric states: Secondary | ICD-10-CM | POA: Diagnosis not present

## 2023-02-17 DIAGNOSIS — I6523 Occlusion and stenosis of bilateral carotid arteries: Secondary | ICD-10-CM | POA: Diagnosis not present

## 2023-02-17 DIAGNOSIS — E785 Hyperlipidemia, unspecified: Secondary | ICD-10-CM | POA: Diagnosis not present

## 2023-02-19 ENCOUNTER — Other Ambulatory Visit: Payer: Self-pay | Admitting: Internal Medicine

## 2023-02-19 DIAGNOSIS — E785 Hyperlipidemia, unspecified: Secondary | ICD-10-CM

## 2023-02-24 ENCOUNTER — Ambulatory Visit: Payer: Medicare Other | Admitting: Physical Medicine and Rehabilitation

## 2023-02-24 ENCOUNTER — Other Ambulatory Visit: Payer: Self-pay

## 2023-02-24 DIAGNOSIS — M25551 Pain in right hip: Secondary | ICD-10-CM | POA: Diagnosis not present

## 2023-02-24 NOTE — Progress Notes (Signed)
   Bianca Ware - 77 y.o. female MRN [id number]  Date of birth: [birth year]  Office Visit Note: Visit Date: 02/24/2023 PCP: Chilton Greathouse, MD Referred by: Chilton Greathouse, MD  Subjective: Chief Complaint  Patient presents with   Right Hip - Pain   HPI:  Bianca Ware is a 77 y.o. female who comes in today for planned repeat Right anesthetic hip arthrogram with fluoroscopic guidance.  The patient has failed conservative care including home exercise, medications, time and activity modification. Prior injection gave more than 50% relief for several months. This injection will be diagnostic and hopefully therapeutic.  Please see requesting physician notes for further details and justification.  Referring: Dr. Doneen Poisson and Rexene Edison, PA-C   ROS Otherwise per HPI.  Assessment & Plan: Visit Diagnoses:    ICD-10-CM   1. Pain in right hip  M25.551 Large Joint Inj: R hip joint    XR C-ARM NO REPORT      Plan: No additional findings.   Meds & Orders: No orders of the defined types were placed in this encounter.   Orders Placed This Encounter  Procedures   Large Joint Inj: R hip joint   XR C-ARM NO REPORT    Follow-up: Return for visit to requesting provider as needed.   Procedures: Large Joint Inj: R hip joint on 02/24/2023 1:01 PM Indications: diagnostic evaluation and pain Details: 22 G 3.5 in needle, fluoroscopy-guided anterior approach  Arthrogram: No  Medications: 4 mL bupivacaine 0.25 %; 40 mg triamcinolone acetonide 40 MG/ML Outcome: tolerated well, no immediate complications  There was excellent flow of contrast producing a partial arthrogram of the hip. The patient did have relief of symptoms during the anesthetic phase of the injection. Procedure, treatment alternatives, risks and benefits explained, specific risks discussed. Consent was given by the patient. Immediately prior to procedure a time out was called to verify the correct patient,  procedure, equipment, support staff and site/side marked as required. Patient was prepped and draped in the usual sterile fashion.          Clinical History: No specialty comments available.     Objective:  VS:  HT:    WT:   BMI:     BP:   HR: bpm  TEMP: ( )  RESP:  Physical Exam   Imaging: No results found.

## 2023-02-24 NOTE — Progress Notes (Signed)
Functional Pain Scale - descriptive words and definitions  Distressing (6)    Pain is present/unable to complete most ADLs limited by pain/sleep is difficult and active distraction is only marginal. Moderate range order  Average Pain  4-7   +Driver, -BT, -Dye Allergies.  Right hip pain

## 2023-02-28 MED ORDER — TRIAMCINOLONE ACETONIDE 40 MG/ML IJ SUSP
40.0000 mg | INTRAMUSCULAR | Status: AC | PRN
  Administered 2023-02-24: 40 mg via INTRA_ARTICULAR

## 2023-02-28 MED ORDER — BUPIVACAINE HCL 0.25 % IJ SOLN
4.0000 mL | INTRAMUSCULAR | Status: AC | PRN
Start: 2023-02-24 — End: 2023-02-24
  Administered 2023-02-24: 4 mL via INTRA_ARTICULAR

## 2023-03-16 DIAGNOSIS — N390 Urinary tract infection, site not specified: Secondary | ICD-10-CM | POA: Diagnosis not present

## 2023-03-24 ENCOUNTER — Ambulatory Visit
Admission: RE | Admit: 2023-03-24 | Discharge: 2023-03-24 | Disposition: A | Discharge status: Home / Self Care | Payer: No Typology Code available for payment source | Source: Ambulatory Visit | Attending: Internal Medicine | Admitting: Internal Medicine

## 2023-03-24 DIAGNOSIS — E785 Hyperlipidemia, unspecified: Secondary | ICD-10-CM

## 2023-04-29 DIAGNOSIS — I2510 Atherosclerotic heart disease of native coronary artery without angina pectoris: Secondary | ICD-10-CM | POA: Diagnosis not present

## 2023-05-04 DIAGNOSIS — K080 Exfoliation of teeth due to systemic causes: Secondary | ICD-10-CM | POA: Diagnosis not present

## 2023-05-05 DIAGNOSIS — Z961 Presence of intraocular lens: Secondary | ICD-10-CM | POA: Diagnosis not present

## 2023-05-20 DIAGNOSIS — K080 Exfoliation of teeth due to systemic causes: Secondary | ICD-10-CM | POA: Diagnosis not present

## 2023-05-21 ENCOUNTER — Ambulatory Visit: Payer: Medicare Other | Attending: Internal Medicine | Admitting: Internal Medicine

## 2023-05-21 ENCOUNTER — Encounter: Payer: Self-pay | Admitting: Internal Medicine

## 2023-05-21 VITALS — BP 122/62 | HR 62 | Ht 67.0 in | Wt 173.8 lb

## 2023-05-21 DIAGNOSIS — R931 Abnormal findings on diagnostic imaging of heart and coronary circulation: Secondary | ICD-10-CM

## 2023-05-21 DIAGNOSIS — I7090 Unspecified atherosclerosis: Secondary | ICD-10-CM | POA: Diagnosis not present

## 2023-05-21 NOTE — Patient Instructions (Signed)
Medication Instructions:  No changes  *If you need a refill on your cardiac medications before your next appointment, please call your pharmacy*   Lab Work: - Please come back for a fasting lipid panel in 2 months.  If you have labs (blood work) drawn today and your tests are completely normal, you will receive your results only by: MyChart Message (if you have MyChart) OR A paper copy in the mail If you have any lab test that is abnormal or we need to change your treatment, we will call you to review the results.   Testing/Procedures: - An echocardiogram has been ordered for you today.   Follow-Up: At [facility], you and your health needs are our priority.  As part of our continuing mission to provide you with exceptional heart care, we have created designated Provider Care Teams.  These Care Teams include your primary Cardiologist (physician) and Advanced Practice Providers (APPs -  Physician Assistants and Nurse Practitioners) who all work together to provide you with the care you need, when you need it.  We recommend signing up for the patient portal called "MyChart".  Sign up information is provided on this After Visit Summary.  MyChart is used to connect with patients for Virtual Visits (Telemedicine).  Patients are able to view lab/test results, encounter notes, upcoming appointments, etc.  Non-urgent messages can be sent to your provider as well.   To learn more about what you can do with MyChart, go to [email or web address].    Your next appointment:   6 month(s)  Provider:   Maisie Fus, MD

## 2023-05-21 NOTE — Progress Notes (Signed)
Cardiology Office Note:    Date:  05/22/2023   ID:  MEGAN HOWEY, DOB [birth year], MRN [id number]  PCP:  Chilton Greathouse, MD   [facility] Providers Cardiologist:  Maisie Fus, MD     Referring MD: Chilton Greathouse, MD   No chief complaint on file. Elevated CAC  History of Present Illness:    Bianca Ware is a 77 y.o. female with no cardiac dx hx , she was a referral for elevated CAC.  She is on crestor 40 mg daily and recently started zetia. Former smoker. She denies angina, dyspnea on exertion, lower extremity edema, PND or orthopnea.   TC 160 mg/dL LDL 89 mg/dL Crt 0.7  03/02/8118 FINDINGS: CORONARY CALCIUM SCORES:  Left Main: 0 LAD: 352 LCx: 201 RCA: 139  Total Agatston Score: 692 MESA database percentile: 89th AORTA MEASUREMENTS: Ascending Aorta: 3.1 cm Descending Aorta:2.6 cm  Past Medical History:  Diagnosis Date   Arthritis    hands   Complication of anesthesia    waking up   OA (osteoarthritis) of knee    right and left   PONV (postoperative nausea and vomiting)    Seasonal allergies    Wears glasses     Past Surgical History:  Procedure Laterality Date   ABDOMINAL HYSTERECTOMY  1970s   BLADDER SUSPENSION  2002   w/ urethral sling   CATARACT EXTRACTION W/ INTRAOCULAR LENS IMPLANT Left 2018   CESAREAN SECTION  x2   yrs ago   DILATION AND CURETTAGE OF UTERUS  1960s   LUMBAR LAMINECTOMY N/A 05/29/2015   Procedure: LEFT L4-5 MICRODISCECTOMY;  Surgeon: Kerrin Champagne, MD;  Location: [facility] OR;  Service: Orthopedics;  Laterality: N/A;   TONSILLECTOMY  child   TOTAL HIP ARTHROPLASTY Left 09/07/2015   Procedure: LEFT TOTAL HIP ARTHROPLASTY ANTERIOR APPROACH;  Surgeon: Kathryne Hitch, MD;  Location: [facility] ORS;  Service: Orthopedics;  Laterality: Left;   TOTAL KNEE ARTHROPLASTY Right 10/01/2018   Procedure: RIGHT TOTAL KNEE ARTHROPLASTY;  Surgeon: Kathryne Hitch, MD;  Location: [facility] ORS;  Service: Orthopedics;  Laterality: Right;    TUBAL LIGATION Bilateral yrs ago    Current Medications: Current Outpatient Medications on File Prior to Visit  Medication Sig Dispense Refill   acetaminophen (TYLENOL) 650 MG CR tablet Take 1,300 mg by mouth at bedtime.     aspirin EC 81 MG tablet Take 81 mg by mouth daily. Swallow whole.     Biotin 5000 MCG CAPS Take 5,000 mcg by mouth 2 (two) times daily.     busPIRone (BUSPAR) 7.5 MG tablet Take 7.5 mg by mouth 2 (two) times daily.     Calcium Carbonate-Vitamin D (CALCIUM + D PO) Take 2 tablets by mouth every morning. Calcium 1200 mg, vitamin d 1000 units     cetirizine (ZYRTEC) 10 MG tablet Take 10 mg by mouth at bedtime.     Cholecalciferol (VITAMIN D) 2000 units tablet Take 2,000 Units by mouth daily.     ezetimibe (ZETIA) 10 MG tablet Take 10 mg by mouth daily.     lisinopril (ZESTRIL) 10 MG tablet Take 5 mg by mouth daily.     methocarbamol (ROBAXIN) 500 MG tablet TAKE 1 TABLET(500 MG) BY MOUTH EVERY 6 HOURS AS NEEDED FOR MUSCLE SPASMS 40 tablet 0   Omega-3 1000 MG CAPS Take 1,000 mg by mouth 3 (three) times daily.     rosuvastatin (CRESTOR) 40 MG tablet Take 40 mg by mouth every evening.  traZODone (DESYREL) 50 MG tablet 1 tablet at bedtime as needed Orally Once a day for 90 days     vitamin C (ASCORBIC ACID) 500 MG tablet Take 500 mg by mouth every morning.      COVID-19 mRNA bivalent vaccine, Pfizer, (PFIZER COVID-19 VAC BIVALENT) injection Inject into the muscle. (Patient not taking: Reported on 02/24/2023) 0.3 mL 0   COVID-19 mRNA Vac-TriS, Pfizer, (PFIZER-BIONT COVID-19 VAC-TRIS) SUSP injection Inject into the muscle. (Patient not taking: Reported on 02/24/2023) 0.3 mL 0   COVID-19 mRNA vaccine 2023-2024 (COMIRNATY) syringe Inject into the muscle. (Patient not taking: Reported on 02/24/2023) 0.3 mL 0   gabapentin (NEURONTIN) 100 MG capsule TAKE 3 CAPSULES(300 MG) BY MOUTH THREE TIMES DAILY (Patient not taking: Reported on 05/21/2023) 90 capsule 0   hydrocortisone 2.5 % cream  Apply 1 Application topically 2 (two) times daily. (Patient not taking: Reported on 05/21/2023)     Melatonin 3 MG TABS Take 12 mg by mouth at bedtime.  (Patient not taking: Reported on 05/21/2023)     Misc Natural Products (GLUCOSAMINE CHOND COMPLEX/MSM PO) Take 1 tablet by mouth 3 (three) times daily. (Patient not taking: Reported on 05/21/2023)     oxyCODONE (OXY IR/ROXICODONE) 5 MG immediate release tablet Take 1-2 tablets (5-10 mg total) by mouth every 4 (four) hours as needed for moderate pain (pain score 4-6). (Patient not taking: Reported on 05/21/2023) 40 tablet 0   No current facility-administered medications on file prior to visit.     Allergies:   Patient has no known allergies.   Social History   Socioeconomic History   Marital status: Married    Spouse name: Not on file   Number of children: Not on file   Years of education: Not on file   Highest education level: Not on file  Occupational History   Not on file  Tobacco Use   Smoking status: Former    Packs/day: 0.25    Years: 15.00    Additional pack years: 0.00    Total pack years: 3.75    Types: Cigarettes    Quit date: 05/24/1977    Years since quitting: 46.0   Smokeless tobacco: Never  Vaping Use   Vaping Use: Never used  Substance and Sexual Activity   Alcohol use: No   Drug use: Never   Sexual activity: Not on file  Other Topics Concern   Not on file  Social History Narrative   Not on file   Social Determinants of Health   Financial Resource Strain: Not on file  Food Insecurity: Not on file  Transportation Needs: Not on file  Physical Activity: Not on file  Stress: Not on file  Social Connections: Not on file     Family History: The patient's family history includes Breast cancer in her sister. Sister has stents.   ROS:   Please see the history of present illness.     All other systems reviewed and are negative.  EKGs/Labs/Other Studies Reviewed:    The following studies were reviewed  today:  CAC FINDINGS: CORONARY CALCIUM SCORES: Left Main: 0 LAD: 352 LCx: 201 RCA: 139 Total Agatston Score: 692 MESA database percentile: 89th   EKG Interpretation  Date/Time:  Thursday May 21 2023 09:05:01 EDT Ventricular Rate:  63 PR Interval:  154 QRS Duration: 82 QT Interval:  402 QTC Calculation: 411 R Axis:   59 Text Interpretation: Normal sinus rhythm Normal ECG When compared with ECG of 03-Oct-1999 09:27, No significant change was  found Confirmed by Carolan Clines [id number]) on 05/21/2023 9:24:24 AM   EKG Interpretation Date/Time:  Thursday May 21 2023 09:05:01 EDT Ventricular Rate:  63 PR Interval:  154 QRS Duration:  82 QT Interval:  402 QTC Calculation: 411 R Axis:   59  Text Interpretation: Normal sinus rhythm Normal ECG When compared with ECG of 03-Oct-1999 09:27, No significant change was found Confirmed by Carolan Clines ([id number]) on 05/21/2023 9:24:24 AM    Recent Labs: No results found for requested labs within last 365 days.  Recent Lipid Panel No results found for: "CHOL", "TRIG", "HDL", "CHOLHDL", "VLDL", "LDLCALC", "LDLDIRECT"   Risk Assessment/Calculations:     Physical Exam:    VS:  Vitals:   05/21/23 0901  BP: 122/62  Pulse: 62  SpO2: 97%    Wt Readings from Last 3 Encounters:  05/21/23 173 lb 12.8 oz (78.8 kg)  10/01/18 177 lb 6 oz (80.5 kg)  09/23/18 177 lb 6 oz (80.5 kg)     GEN:  Well nourished, well developed in no acute distress HEENT: Normal NECK: No JVD CARDIAC: RRR, no murmurs, rubs, gallops RESPIRATORY:  Clear to auscultation without rales, wheezing or rhonchi  ABDOMEN:  non-distended MUSCULOSKELETAL:  No edema; No deformity  SKIN: Warm and dry NEUROLOGIC:  Alert and oriented x 3 PSYCHIATRIC:  Normal affect   ASSESSMENT:    1. Calcification of artery   2. Elevated coronary artery calcium score   - Well controlled blood pressure - Will start with an echocardiogram considering LAD CAC, she discussed possible hip surgery  as well. Otherwise, she can do > 4 mets without symptoms. If her TTE is unremarkable; she is acceptable cardiac risk for hip surgery  HTN - she was concerned about her lisinopril and light headedness. I discussed she can trial off of it and see if this helps her symptoms. She will also plan to check her blood pressure and ensure it is <130/80 mmhg  PLAN:    In order of problems listed above:  Continue asa 81 mg daily Continue crestor and zetia 10 mg daily TTE Fasting lipids in 2 months Patient requested to see Dr. [place], will be following up with him      Medication Adjustments/Labs and Tests Ordered: Current medicines are reviewed at length with the patient today.  Concerns regarding medicines are outlined above.  Orders Placed This Encounter  Procedures   Lipid Profile   EKG 12-Lead   ECHOCARDIOGRAM COMPLETE   No orders of the defined types were placed in this encounter.   Patient Instructions  Medication Instructions:  No changes  *If you need a refill on your cardiac medications before your next appointment, please call your pharmacy*   Lab Work: - Please come back for a fasting lipid panel in 2 months.  If you have labs (blood work) drawn today and your tests are completely normal, you will receive your results only by: MyChart Message (if you have MyChart) OR A paper copy in the mail If you have any lab test that is abnormal or we need to change your treatment, we will call you to review the results.   Testing/Procedures: - An echocardiogram has been ordered for you today.   Follow-Up: At [facility], you and your health needs are our priority.  As part of our continuing mission to provide you with exceptional heart care, we have created designated Provider Care Teams.  These Care Teams include your primary Cardiologist (physician) and Advanced Practice Providers (APPs -  Physician Assistants and Nurse Practitioners) who all work together to provide  you with the care you need, when you need it.  We recommend signing up for the patient portal called "MyChart".  Sign up information is provided on this After Visit Summary.  MyChart is used to connect with patients for Virtual Visits (Telemedicine).  Patients are able to view lab/test results, encounter notes, upcoming appointments, etc.  Non-urgent messages can be sent to your provider as well.   To learn more about what you can do with MyChart, go to [email or web address].    Your next appointment:   6 month(s)  Provider:   Maisie Fus, MD       Signed, Maisie Fus, MD  05/22/2023 8:10 PM    [facility]

## 2023-05-22 ENCOUNTER — Telehealth: Payer: Self-pay

## 2023-05-22 NOTE — Telephone Encounter (Signed)
Spoke to patient she stated she wants to change care to Dr.Jordan.Advised I will send message to Dr.Branch and Dr.Jordan for approval to change care.

## 2023-05-26 ENCOUNTER — Other Ambulatory Visit: Payer: Self-pay

## 2023-05-26 DIAGNOSIS — R931 Abnormal findings on diagnostic imaging of heart and coronary circulation: Secondary | ICD-10-CM

## 2023-05-26 DIAGNOSIS — I7090 Unspecified atherosclerosis: Secondary | ICD-10-CM

## 2023-05-26 NOTE — Progress Notes (Signed)
ech 

## 2023-06-01 ENCOUNTER — Encounter: Payer: Self-pay | Admitting: Orthopaedic Surgery

## 2023-06-01 ENCOUNTER — Ambulatory Visit: Payer: Medicare Other | Admitting: Orthopaedic Surgery

## 2023-06-01 DIAGNOSIS — M1611 Unilateral primary osteoarthritis, right hip: Secondary | ICD-10-CM | POA: Diagnosis not present

## 2023-06-01 DIAGNOSIS — M25551 Pain in right hip: Secondary | ICD-10-CM | POA: Diagnosis not present

## 2023-06-01 NOTE — Progress Notes (Signed)
The patient is well-known to me.  We have replaced her left hip remotely in the past.  She has known and well-documented severe arthritis of her right hip.  She has had multiple steroid injections in that right hip and has gotten to the point where those injections are no longer lasting.  She has daily right hip pain and it is detrimentally affecting her mobility, her quality of life, and her actives daily living.  It can be 10 out of 10 at times and has been getting worse over the last 1 to 2 years.  She has had no acute change in her medical status.  She is followed by Dr. Peter [place] her cardiologist and does have an echocardiogram coming up on July 24.  I was able to review all of her notes and medications within epic.  She is not on blood thinning medications.  She is not a diabetic.  Examination of her left operative hip shows that moves smoothly and fluidly.  Her right hip has pretty good range of motion but there is severe pain with internal and external rotation of the hip.  Previous x-rays of the pelvis and right hip show severe end-stage arthritis with joint space loss/narrowing, osteophytes and cystic changes.  At this point she is interested in proceeding with a left hip replacement.  We will work on getting her on the schedule for this replacement.  All question concerns were answered and addressed.  Having had this before she is fully aware of the risks and benefits of surgery including what to expect from an intraoperative and postoperative standpoint.

## 2023-06-02 NOTE — Telephone Encounter (Signed)
Spoke to patient Echo scheduled 7/24 at 2:45 pm.Advised I will call with results after Dr.Jordan reviews.Dr.Jordan advised if Echo is ok follow up with him in 1 year.

## 2023-06-17 ENCOUNTER — Other Ambulatory Visit (HOSPITAL_COMMUNITY): Payer: Medicare Other

## 2023-06-17 ENCOUNTER — Ambulatory Visit (HOSPITAL_COMMUNITY): Payer: Medicare Other | Attending: Cardiology

## 2023-06-17 DIAGNOSIS — I351 Nonrheumatic aortic (valve) insufficiency: Secondary | ICD-10-CM | POA: Insufficient documentation

## 2023-06-17 DIAGNOSIS — R931 Abnormal findings on diagnostic imaging of heart and coronary circulation: Secondary | ICD-10-CM | POA: Diagnosis not present

## 2023-06-17 LAB — ECHOCARDIOGRAM COMPLETE
Area-P 1/2: 4.31 cm2
P 1/2 time: 447 msec
S' Lateral: 2.7 cm

## 2023-06-29 DIAGNOSIS — I10 Essential (primary) hypertension: Secondary | ICD-10-CM | POA: Diagnosis not present

## 2023-07-07 ENCOUNTER — Other Ambulatory Visit: Payer: Self-pay

## 2023-07-09 DIAGNOSIS — K080 Exfoliation of teeth due to systemic causes: Secondary | ICD-10-CM | POA: Diagnosis not present

## 2023-07-13 DIAGNOSIS — K080 Exfoliation of teeth due to systemic causes: Secondary | ICD-10-CM | POA: Diagnosis not present

## 2023-07-15 NOTE — Patient Instructions (Signed)
DUE TO COVID-19 ONLY TWO VISITORS  (aged 76 and older)  ARE ALLOWED TO COME WITH YOU AND STAY IN THE WAITING ROOM ONLY DURING PRE OP AND PROCEDURE.   **NO VISITORS ARE ALLOWED IN THE SHORT STAY AREA OR RECOVERY ROOM!!**  IF YOU WILL BE ADMITTED INTO THE HOSPITAL YOU ARE ALLOWED ONLY FOUR SUPPORT PEOPLE DURING VISITATION HOURS ONLY (7 AM -8PM)   The support person(s) must pass our screening, gel in and out, and wear a mask at all times, including in the patient's room. Patients must also wear a mask when staff or their support person are in the room. Visitors GUEST BADGE MUST BE WORN VISIBLY  One adult visitor may remain with you overnight and MUST be in the room by 8 P.M.     Your procedure is scheduled on: 07/24/23   Report to [facility] Main Entrance    Report to admitting at : 6:00 AM   Call this number if you have problems the morning of surgery [phone]   Do not eat food :After Midnight.   After Midnight you may have the following liquids until : 5:30 AM DAY OF SURGERY  Water Black Coffee (sugar ok, NO MILK/CREAM OR CREAMERS)  Tea (sugar ok, NO MILK/CREAM OR CREAMERS) regular and decaf                             Plain Jell-O (NO RED)                                           Fruit ices (not with fruit pulp, NO RED)                                     Popsicles (NO RED)                                                                  Juice: apple, WHITE grape, WHITE cranberry Sports drinks like Gatorade (NO RED)   The day of surgery:  Drink ONE (1) Pre-Surgery Clear Ensure at : 5:30 AM the morning of surgery. Drink in one sitting. Do not sip.  This drink was given to you during your hospital  pre-op appointment visit. Nothing else to drink after completing the  Pre-Surgery Clear Ensure or G2.          If you have questions, please contact your surgeon's office.  FOLLOW ANY ADDITIONAL PRE OP INSTRUCTIONS YOU RECEIVED FROM YOUR SURGEON'S OFFICE!!!   Oral  Hygiene is also important to reduce your risk of infection.                                    Remember - BRUSH YOUR TEETH THE MORNING OF SURGERY WITH YOUR REGULAR TOOTHPASTE  DENTURES WILL BE REMOVED PRIOR TO SURGERY PLEASE DO NOT APPLY "Poly grip" OR ADHESIVES!!!   Do NOT smoke after Midnight   Take these medicines the morning of surgery  with A SIP OF WATER: buspirone.Tylenol as needed.                              You may not have any metal on your body including hair pins, jewelry, and body piercing             Do not wear make-up, lotions, powders, perfumes/cologne, or deodorant  Do not wear nail polish including gel and S&S, artificial/acrylic nails, or any other type of covering on natural nails including finger and toenails. If you have artificial nails, gel coating, etc. that needs to be removed by a nail salon please have this removed prior to surgery or surgery may need to be canceled/ delayed if the surgeon/ anesthesia feels like they are unable to be safely monitored.   Do not shave  48 hours prior to surgery.    Do not bring valuables to the hospital. [facility] IS NOT             RESPONSIBLE   FOR VALUABLES.   Contacts, glasses, or bridgework may not be worn into surgery.   Bring small overnight bag day of surgery.   DO NOT BRING YOUR HOME MEDICATIONS TO THE HOSPITAL. PHARMACY WILL DISPENSE MEDICATIONS LISTED ON YOUR MEDICATION LIST TO YOU DURING YOUR ADMISSION IN THE HOSPITAL!    Patients discharged on the day of surgery will not be allowed to drive home.  Someone NEEDS to stay with you for the first 24 hours after anesthesia.   Special Instructions: Bring a copy of your healthcare power of attorney and living will documents         the day of surgery if you haven't scanned them before.              Please read over the following fact sheets you were given: IF YOU HAVE QUESTIONS ABOUT YOUR PRE-OP INSTRUCTIONS PLEASE CALL [phone]      Pre-operative 5 CHG Bath  Instructions   You can play a key role in reducing the risk of infection after surgery. Your skin needs to be as free of germs as possible. You can reduce the number of germs on your skin by washing with CHG (chlorhexidine gluconate) soap before surgery. CHG is an antiseptic soap that kills germs and continues to kill germs even after washing.   DO NOT use if you have an allergy to chlorhexidine/CHG or antibacterial soaps. If your skin becomes reddened or irritated, stop using the CHG and notify one of our RNs at : [phone].   Please shower with the CHG soap starting 4 days before surgery using the following schedule:     Please keep in mind the following:  DO NOT shave, including legs and underarms, starting the day of your first shower.   You may shave your face at any point before/day of surgery.  Place clean sheets on your bed the day you start using CHG soap. Use a clean washcloth (not used since being washed) for each shower. DO NOT sleep with pets once you start using the CHG.   CHG Shower Instructions:  If you choose to wash your hair and private area, wash first with your normal shampoo/soap.  After you use shampoo/soap, rinse your hair and body thoroughly to remove shampoo/soap residue.  Turn the water OFF and apply about 3 tablespoons (45 ml) of CHG soap to a CLEAN washcloth.  Apply CHG soap ONLY FROM YOUR  NECK DOWN TO YOUR TOES (washing for 3-5 minutes)  DO NOT use CHG soap on face, private areas, open wounds, or sores.  Pay special attention to the area where your surgery is being performed.  If you are having back surgery, having someone wash your back for you may be helpful. Wait 2 minutes after CHG soap is applied, then you may rinse off the CHG soap.  Pat dry with a clean towel  Put on clean clothes/pajamas   If you choose to wear lotion, please use ONLY the CHG-compatible lotions on the back of this paper.     Additional instructions for the day of surgery: DO NOT  APPLY any lotions, deodorants, cologne, or perfumes.   Put on clean/comfortable clothes.  Brush your teeth.  Ask your nurse before applying any prescription medications to the skin.    CHG Compatible Lotions   Aveeno Moisturizing lotion  Cetaphil Moisturizing Cream  Cetaphil Moisturizing Lotion  Clairol Herbal Essence Moisturizing Lotion, Dry Skin  Clairol Herbal Essence Moisturizing Lotion, Extra Dry Skin  Clairol Herbal Essence Moisturizing Lotion, Normal Skin  Curel Age Defying Therapeutic Moisturizing Lotion with Alpha Hydroxy  Curel Extreme Care Body Lotion  Curel Soothing Hands Moisturizing Hand Lotion  Curel Therapeutic Moisturizing Cream, Fragrance-Free  Curel Therapeutic Moisturizing Lotion, Fragrance-Free  Curel Therapeutic Moisturizing Lotion, Original Formula  Eucerin Daily Replenishing Lotion  Eucerin Dry Skin Therapy Plus Alpha Hydroxy Crme  Eucerin Dry Skin Therapy Plus Alpha Hydroxy Lotion  Eucerin Original Crme  Eucerin Original Lotion  Eucerin Plus Crme Eucerin Plus Lotion  Eucerin TriLipid Replenishing Lotion  Keri Anti-Bacterial Hand Lotion  Keri Deep Conditioning Original Lotion Dry Skin Formula Softly Scented  Keri Deep Conditioning Original Lotion, Fragrance Free Sensitive Skin Formula  Keri Lotion Fast Absorbing Fragrance Free Sensitive Skin Formula  Keri Lotion Fast Absorbing Softly Scented Dry Skin Formula  Keri Original Lotion  Keri Skin Renewal Lotion Keri Silky Smooth Lotion  Keri Silky Smooth Sensitive Skin Lotion  Nivea Body Creamy Conditioning Oil  Nivea Body Extra Enriched Lotion  Nivea Body Original Lotion  Nivea Body Sheer Moisturizing Lotion Nivea Crme  Nivea Skin Firming Lotion  NutraDerm 30 Skin Lotion  NutraDerm Skin Lotion  NutraDerm Therapeutic Skin Cream  NutraDerm Therapeutic Skin Lotion  ProShield Protective Hand Cream  Provon moisturizing lotion   Incentive Spirometer  An incentive spirometer is a tool that can help  keep your lungs clear and active. This tool measures how well you are filling your lungs with each breath. Taking long deep breaths may help reverse or decrease the chance of developing breathing (pulmonary) problems (especially infection) following: A long period of time when you are unable to move or be active. BEFORE THE PROCEDURE  If the spirometer includes an indicator to show your best effort, your nurse or respiratory therapist will set it to a desired goal. If possible, sit up straight or lean slightly forward. Try not to slouch. Hold the incentive spirometer in an upright position. INSTRUCTIONS FOR USE  Sit on the edge of your bed if possible, or sit up as far as you can in bed or on a chair. Hold the incentive spirometer in an upright position. Breathe out normally. Place the mouthpiece in your mouth and seal your lips tightly around it. Breathe in slowly and as deeply as possible, raising the piston or the ball toward the top of the column. Hold your breath for 3-5 seconds or for as long as possible. Allow the piston or  ball to fall to the bottom of the column. Remove the mouthpiece from your mouth and breathe out normally. Rest for a few seconds and repeat Steps 1 through 7 at least 10 times every 1-2 hours when you are awake. Take your time and take a few normal breaths between deep breaths. The spirometer may include an indicator to show your best effort. Use the indicator as a goal to work toward during each repetition. After each set of 10 deep breaths, practice coughing to be sure your lungs are clear. If you have an incision (the cut made at the time of surgery), support your incision when coughing by placing a pillow or rolled up towels firmly against it. Once you are able to get out of bed, walk around indoors and cough well. You may stop using the incentive spirometer when instructed by your caregiver.  RISKS AND COMPLICATIONS Take your time so you do not get dizzy or  light-headed. If you are in pain, you may need to take or ask for pain medication before doing incentive spirometry. It is harder to take a deep breath if you are having pain. AFTER USE Rest and breathe slowly and easily. It can be helpful to keep track of a log of your progress. Your caregiver can provide you with a simple table to help with this. If you are using the spirometer at home, follow these instructions: SEEK MEDICAL CARE IF:  You are having difficultly using the spirometer. You have trouble using the spirometer as often as instructed. Your pain medication is not giving enough relief while using the spirometer. You develop fever of 100.5 F (38.1 C) or higher. SEEK IMMEDIATE MEDICAL CARE IF:  You cough up bloody sputum that had not been present before. You develop fever of 102 F (38.9 C) or greater. You develop worsening pain at or near the incision site. MAKE SURE YOU:  Understand these instructions. Will watch your condition. Will get help right away if you are not doing well or get worse. Document Released: 03/23/2007 Document Revised: 02/02/2012 Document Reviewed: 05/24/2007 [facility] Patient Information 2014 [place], [place].   ________________________________________________________________________

## 2023-07-16 ENCOUNTER — Other Ambulatory Visit: Payer: Self-pay

## 2023-07-16 ENCOUNTER — Encounter (HOSPITAL_COMMUNITY): Payer: Self-pay

## 2023-07-16 ENCOUNTER — Encounter (HOSPITAL_COMMUNITY)
Admission: RE | Admit: 2023-07-16 | Discharge: 2023-07-16 | Disposition: A | Discharge status: Home / Self Care | Payer: Medicare Other | Source: Ambulatory Visit | Attending: Orthopaedic Surgery | Admitting: Orthopaedic Surgery

## 2023-07-16 ENCOUNTER — Telehealth: Payer: Self-pay

## 2023-07-16 VITALS — BP 156/58 | HR 73 | Temp 98.2°F | Ht 67.0 in | Wt 223.0 lb

## 2023-07-16 DIAGNOSIS — M1611 Unilateral primary osteoarthritis, right hip: Secondary | ICD-10-CM | POA: Insufficient documentation

## 2023-07-16 DIAGNOSIS — Z87891 Personal history of nicotine dependence: Secondary | ICD-10-CM | POA: Insufficient documentation

## 2023-07-16 DIAGNOSIS — Z01818 Encounter for other preprocedural examination: Secondary | ICD-10-CM | POA: Diagnosis not present

## 2023-07-16 DIAGNOSIS — I10 Essential (primary) hypertension: Secondary | ICD-10-CM

## 2023-07-16 HISTORY — DX: Abnormal findings on diagnostic imaging of heart and coronary circulation: R93.1

## 2023-07-16 HISTORY — DX: Pneumonia, unspecified organism: J18.9

## 2023-07-16 LAB — COMPREHENSIVE METABOLIC PANEL
ALT: 43 U/L (ref 0–44)
AST: 29 U/L (ref 15–41)
Albumin: 4.5 g/dL (ref 3.5–5.0)
Alkaline Phosphatase: 69 U/L (ref 38–126)
Anion gap: 8 (ref 5–15)
BUN: 15 mg/dL (ref 8–23)
CO2: 26 mmol/L (ref 22–32)
Calcium: 9.4 mg/dL (ref 8.9–10.3)
Chloride: 103 mmol/L (ref 98–111)
Creatinine, Ser: 0.61 mg/dL (ref 0.44–1.00)
GFR, Estimated: >60 mL/min (ref >60)
Glucose, Bld: 113 mg/dL — ABNORMAL HIGH (ref 70–99)
Potassium: 3.8 mmol/L (ref 3.5–5.1)
Sodium: 137 mmol/L (ref 135–145)
Total Bilirubin: 0.4 mg/dL (ref 0.3–1.2)
Total Protein: 8 g/dL (ref 6.5–8.1)

## 2023-07-16 LAB — CBC
HCT: 41.7 % (ref 36.0–46.0)
Hemoglobin: 13.5 g/dL (ref 12.0–15.0)
MCH: 30.1 pg (ref 26.0–34.0)
MCHC: 32.4 g/dL (ref 30.0–36.0)
MCV: 92.9 fL (ref 80.0–100.0)
Platelets: 260 10*3/uL (ref 150–400)
RBC: 4.49 MIL/uL (ref 3.87–5.11)
RDW: 12.4 % (ref 11.5–15.5)
WBC: 8.1 10*3/uL (ref 4.0–10.5)
nRBC: 0 % (ref 0.0–0.2)

## 2023-07-16 LAB — SURGICAL PCR SCREEN
MRSA, PCR: NEGATIVE
Staphylococcus aureus: NEGATIVE

## 2023-07-16 NOTE — Progress Notes (Addendum)
For Short Stay: COVID SWAB appointment date:  Bowel Prep reminder:   For Anesthesia: PCP - Chilton Greathouse, MD  Cardiologist - Maisie Fus, MD . LOV: 05/21/23  Chest x-ray -  EKG - 05/21/23 Stress Test -  ECHO - 06/17/23 Cardiac Cath -  Pacemaker/ICD device last checked: Pacemaker orders received: Device Rep notified:  Spinal Cord Stimulator: N/A  Sleep Study - N/A CPAP -   Fasting Blood Sugar - N/A Checks Blood Sugar _____ times a day Date and result of last Hgb A1c-  Last dose of GLP1 agonist- N/A GLP1 instructions:   Last dose of SGLT-2 inhibitors- N/A SGLT-2 instructions:   Blood Thinner Instructions: Aspirin Instructions: No instructions yet. Last Dose:  Activity level: Can go up a flight of stairs and activities of daily living without stopping and without chest pain and/or shortness of breath   Able to exercise without chest pain and/or shortness of breath  Anesthesia review: Hx: elevated coronary artery calcium.  Patient denies shortness of breath, fever, cough and chest pain at PAT appointment   Patient verbalized understanding of instructions that were given to them at the PAT appointment. Patient was also instructed that they will need to review over the PAT instructions again at home before surgery.

## 2023-07-16 NOTE — Telephone Encounter (Signed)
Scheduled for right THA on 07/24/23.  Had a tooth pulled on Monday, 07/13/23 and is on Amoxicillin 2000 mg a day.  Okay to proceed with surgery?

## 2023-07-17 NOTE — Telephone Encounter (Signed)
I called and advised patient.

## 2023-07-17 NOTE — Anesthesia Preprocedure Evaluation (Addendum)
Anesthesia Evaluation  Patient identified by MRN, date of birth, ID band Patient awake    Reviewed: Allergy & Precautions, NPO status , Patient's Chart, lab work & pertinent test results  History of Anesthesia Complications (+) PONV and history of anesthetic complications  Airway Mallampati: I  TM Distance: >3 FB Neck ROM: Full    Dental  (+) Teeth Intact, Dental Advisory Given,    Pulmonary neg shortness of breath, neg sleep apnea, neg COPD, neg recent URI, former smoker   breath sounds clear to auscultation       Cardiovascular (-) hypertension(-) angina + CAD  (-) CHF  Rhythm:Regular  1. Left ventricular ejection fraction, by estimation, is 55 to 60%. Left  ventricular ejection fraction by 3D volume is 55 %. The left ventricle has  normal function. The left ventricle has no regional wall motion  abnormalities. Left ventricular diastolic   parameters were normal. The average left ventricular global longitudinal  strain is -20.6 %. The global longitudinal strain is normal.   2. Right ventricular systolic function is normal. The right ventricular  size is normal. There is normal pulmonary artery systolic pressure. The  estimated right ventricular systolic pressure is 35.7 mmHg.   3. The mitral valve is grossly normal. Trivial mitral valve  regurgitation. No evidence of mitral stenosis.   4. The aortic valve is tricuspid. There is mild calcification of the  aortic valve. Aortic valve regurgitation is mild to moderate. Aortic valve  sclerosis is present, with no evidence of aortic valve stenosis.   5. The inferior vena cava is normal in size with greater than 50%  respiratory variability, suggesting right atrial pressure of 3 mmHg.      Neuro/Psych  Neuromuscular disease    GI/Hepatic negative GI ROS, Neg liver ROS,,,  Endo/Other  negative endocrine ROS    Renal/GU negative Renal ROS     Musculoskeletal  (+) Arthritis  ,    Abdominal   Peds  Hematology negative hematology ROS (+) Lab Results      Component                Value               Date                      WBC                      8.1                 07/16/2023                HGB                      13.5                07/16/2023                HCT                      41.7                07/16/2023                MCV                      92.9  07/16/2023                PLT                      260                 07/16/2023              Anesthesia Other Findings   Reproductive/Obstetrics                              Anesthesia Physical Anesthesia Plan  ASA: 2  Anesthesia Plan: MAC and Spinal   Post-op Pain Management: Ofirmev IV (intra-op)*   Induction: Intravenous  PONV Risk Score and Plan: 3 and Ondansetron, Propofol infusion and Dexamethasone  Airway Management Planned: Nasal Cannula, Natural Airway and Simple Face Mask  Additional Equipment: None  Intra-op Plan:   Post-operative Plan:   Informed Consent: I have reviewed the patients History and Physical, chart, labs and discussed the procedure including the risks, benefits and alternatives for the proposed anesthesia with the patient or authorized representative who has indicated his/her understanding and acceptance.     Dental advisory given  Plan Discussed with: CRNA  Anesthesia Plan Comments: (See PAT note 07/16/23)        Anesthesia Quick Evaluation

## 2023-07-17 NOTE — Progress Notes (Signed)
Anesthesia Chart Review   Case: [id number] Date/Time: 07/24/23 0815   Procedure: RIGHT TOTAL HIP ARTHROPLASTY ANTERIOR APPROACH (Right: Hip)   Anesthesia type: Spinal   Pre-op diagnosis: osteoarthritis right hip   Location: [facility] ROOM 10 / [facility]   Surgeons: Kathryne Hitch, MD       DISCUSSION:77 y.o. former smoker with h/o PONV, right hip OA scheduled for above procedure 07/24/2023 with Dr. Doneen Poisson.   Per 06/17/23 Echo results comments, "This study demonstrates:  Echo looks good. LV function is normal. Mild to moderate AI which is not a concern at this point. Medication changes / Follow up studies / Other recommendations:   She is cleared for hip surgery"  Per 12/01/2022 PCP notes regarding carotid stenosis, "12/01/2022: Extensive discussion regarding results of ultrasound, expectations, management as below, thought process to recheck in 6 months, no focal neurologic deficits, start aspirin therapy, add Zetia, discussed checking blood pressure log twice a day for the next 2 weeks, calling us with results, potential for adding ACE inhibitor/ARB. No focal neurologic deficits. No bleeding complications. Reassurance was given to the patient and expectations discussed. 11/25/2022: Carotid ultrasound bilateral obtained on 11/25/2022 for right carotid bruit revealed blockage of 50 to 69% on the right side, left side was less than 50%, normal flow in the vertebral arteries. This is certainly concerning for the need for tight cardiovascular risk factor management. This does not meet criteria for any type of surgical intervention. Would use a baby aspirin every day, and given that last LDL or bad cholesterol was 89 with now at target of less than 70, would add Zetia 10 mg to your rosuvastatin. Would keep a blood pressure log 3 times a week with target average blood pressure less than 130/80. May need to consider adding a blood pressure medication. Office visit with me in 2 to 3 months preceded by  rechecking lipid and c-Met prior to the office visit."  VS: BP (!) 156/58   Pulse 73   Temp 36.8 C (Oral)   Ht 5\' 7"  (1.702 m)   Wt 101.2 kg   SpO2 100%   BMI 34.93 kg/m   PROVIDERS: Avva, Ravisankar, MD is PCP    LABS: Labs reviewed: Acceptable for surgery. (all labs ordered are listed, but only abnormal results are displayed)  Labs Reviewed  COMPREHENSIVE METABOLIC PANEL - Abnormal; Notable for the following components:      Result Value   Glucose, Bld 113 (*)    All other components within normal limits  SURGICAL PCR SCREEN  CBC  TYPE AND SCREEN     IMAGES: US Carotid 11/25/22 IMPRESSION: 1. Right ICA stenosis estimated at 50-69%. 2. Left ICA narrowing less than 50%. 3. Normal antegrade vertebral flow bilaterally.    EKG:   CV: Echo 06/17/23 1. Left ventricular ejection fraction, by estimation, is 55 to 60%. Left  ventricular ejection fraction by 3D volume is 55 %. The left ventricle has  normal function. The left ventricle has no regional wall motion  abnormalities. Left ventricular diastolic   parameters were normal. The average left ventricular global longitudinal  strain is -20.6 %. The global longitudinal strain is normal.   2. Right ventricular systolic function is normal. The right ventricular  size is normal. There is normal pulmonary artery systolic pressure. The  estimated right ventricular systolic pressure is 35.7 mmHg.   3. The mitral valve is grossly normal. Trivial mitral valve  regurgitation. No evidence of mitral stenosis.   4. The  aortic valve is tricuspid. There is mild calcification of the  aortic valve. Aortic valve regurgitation is mild to moderate. Aortic valve  sclerosis is present, with no evidence of aortic valve stenosis.   5. The inferior vena cava is normal in size with greater than 50%  respiratory variability, suggesting right atrial pressure of 3 mmHg.  Past Medical History:  Diagnosis Date   Arthritis    hands    Complication of anesthesia    waking up   Elevated coronary artery calcium score    OA (osteoarthritis) of knee    right and left   Pneumonia    PONV (postoperative nausea and vomiting)    Seasonal allergies    Wears glasses     Past Surgical History:  Procedure Laterality Date   ABDOMINAL HYSTERECTOMY  1970s   BLADDER SUSPENSION  2002   w/ urethral sling   CATARACT EXTRACTION W/ INTRAOCULAR LENS IMPLANT Left 2018   CESAREAN SECTION  x2   yrs ago   DILATION AND CURETTAGE OF UTERUS  1960s   LUMBAR LAMINECTOMY N/A 05/29/2015   Procedure: LEFT L4-5 MICRODISCECTOMY;  Surgeon: Kerrin Champagne, MD;  Location: [facility];  Service: Orthopedics;  Laterality: N/A;   TONSILLECTOMY  child   TOTAL HIP ARTHROPLASTY Left 09/07/2015   Procedure: LEFT TOTAL HIP ARTHROPLASTY ANTERIOR APPROACH;  Surgeon: Kathryne Hitch, MD;  Location: [facility];  Service: Orthopedics;  Laterality: Left;   TOTAL KNEE ARTHROPLASTY Right 10/01/2018   Procedure: RIGHT TOTAL KNEE ARTHROPLASTY;  Surgeon: Kathryne Hitch, MD;  Location: [facility];  Service: Orthopedics;  Laterality: Right;   TUBAL LIGATION Bilateral yrs ago    MEDICATIONS:  amoxicillin (AMOXIL) 500 MG capsule   acetaminophen (TYLENOL) 650 MG CR tablet   Ascorbic Acid (VITAMIN C) 1000 MG tablet   aspirin EC 81 MG tablet   Biotin 5000 MCG CAPS   busPIRone (BUSPAR) 7.5 MG tablet   Calcium Carbonate-Vitamin D (CALCIUM + D PO)   cetirizine (ZYRTEC) 10 MG tablet   Cholecalciferol (VITAMIN D) 2000 units tablet   COVID-19 mRNA bivalent vaccine, Pfizer, (PFIZER COVID-19 VAC BIVALENT) injection   COVID-19 mRNA Vac-TriS, Pfizer, (PFIZER-BIONT COVID-19 VAC-TRIS) SUSP injection   COVID-19 mRNA vaccine 2023-2024 (COMIRNATY) syringe   ezetimibe (ZETIA) 10 MG tablet   gabapentin (NEURONTIN) 100 MG capsule   lisinopril (ZESTRIL) 5 MG tablet   methocarbamol (ROBAXIN) 500 MG tablet   methocarbamol (ROBAXIN) 750 MG tablet   Multiple Vitamins-Minerals (HAIR SKIN  NAILS PO)   Nutritional Supplements (ESTROVEN PO)   Omega-3 1000 MG CAPS   oxyCODONE (OXY IR/ROXICODONE) 5 MG immediate release tablet   rosuvastatin (CRESTOR) 40 MG tablet   traZODone (DESYREL) 50 MG tablet   No current facility-administered medications for this encounter.    Jodell Cipro Ward, PA-C [facility] [phone]

## 2023-07-21 DIAGNOSIS — K080 Exfoliation of teeth due to systemic causes: Secondary | ICD-10-CM | POA: Diagnosis not present

## 2023-07-23 LAB — TYPE AND SCREEN
ABO/RH(D): A POS
Antibody Screen: NEGATIVE

## 2023-07-23 NOTE — H&P (Signed)
TOTAL HIP ADMISSION H&P  Patient is admitted for right total hip arthroplasty.  Subjective:  Chief Complaint: right hip pain  HPI: Bianca Ware, 77 y.o. female, has a history of pain and functional disability in the right hip(s) due to arthritis and patient has failed non-surgical conservative treatments for greater than 12 weeks to include NSAID's and/or analgesics, corticosteriod injections, flexibility and strengthening excercises, use of assistive devices, and activity modification.  Onset of symptoms was gradual starting 2 years ago with gradually worsening course since that time.The patient noted no past surgery on the right hip(s).  Patient currently rates pain in the right hip at 10 out of 10 with activity. Patient has night pain, worsening of pain with activity and weight bearing, trendelenberg gait, pain that interfers with activities of daily living, and pain with passive range of motion. Patient has evidence of subchondral sclerosis, periarticular osteophytes, and joint space narrowing by imaging studies. This condition presents safety issues increasing the risk of falls.  There is no current active infection.  Patient Active Problem List   Diagnosis Date Noted   Unilateral primary osteoarthritis, right hip 06/05/2022   Status post total knee replacement, right 10/01/2018   Chronic pain of left knee 08/11/2018   Unilateral primary osteoarthritis, left knee 08/11/2018   Chronic pain of right knee 02/22/2018   Status post total replacement of left hip 09/07/2015   Lumbar disc herniation with radiculopathy 05/29/2015   Spondylolisthesis of lumbar region 05/29/2015   Past Medical History:  Diagnosis Date   Arthritis    hands   Complication of anesthesia    waking up   Elevated coronary artery calcium score    OA (osteoarthritis) of knee    right and left   Pneumonia    PONV (postoperative nausea and vomiting)    Seasonal allergies    Wears glasses     Past Surgical  History:  Procedure Laterality Date   ABDOMINAL HYSTERECTOMY  1970s   BLADDER SUSPENSION  2002   w/ urethral sling   CATARACT EXTRACTION W/ INTRAOCULAR LENS IMPLANT Left 2018   CESAREAN SECTION  x2   yrs ago   DILATION AND CURETTAGE OF UTERUS  1960s   LUMBAR LAMINECTOMY N/A 05/29/2015   Procedure: LEFT L4-5 MICRODISCECTOMY;  Surgeon: Kerrin Champagne, MD;  Location: [facility] OR;  Service: Orthopedics;  Laterality: N/A;   TONSILLECTOMY  child   TOTAL HIP ARTHROPLASTY Left 09/07/2015   Procedure: LEFT TOTAL HIP ARTHROPLASTY ANTERIOR APPROACH;  Surgeon: Kathryne Hitch, MD;  Location: [facility];  Service: Orthopedics;  Laterality: Left;   TOTAL KNEE ARTHROPLASTY Right 10/01/2018   Procedure: RIGHT TOTAL KNEE ARTHROPLASTY;  Surgeon: Kathryne Hitch, MD;  Location: [facility];  Service: Orthopedics;  Laterality: Right;   TUBAL LIGATION Bilateral yrs ago    No current facility-administered medications for this encounter.   Current Outpatient Medications  Medication Sig Dispense Refill Last Dose   acetaminophen (TYLENOL) 650 MG CR tablet Take 1,300 mg by mouth every 8 (eight) hours as needed for pain.      Ascorbic Acid (VITAMIN C) 1000 MG tablet Take 1,000 mg by mouth every morning.      aspirin EC 81 MG tablet Take 81 mg by mouth daily. Swallow whole.      Biotin 5000 MCG CAPS Take 5,000 mcg by mouth daily with supper.      busPIRone (BUSPAR) 7.5 MG tablet Take 7.5 mg by mouth 2 (two) times daily.  Calcium Carbonate-Vitamin D (CALCIUM + D PO) Take 1 tablet by mouth every morning.      cetirizine (ZYRTEC) 10 MG tablet Take 10 mg by mouth at bedtime.      Cholecalciferol (VITAMIN D) 2000 units tablet Take 2,000 Units by mouth daily.      ezetimibe (ZETIA) 10 MG tablet Take 10 mg by mouth daily with supper.      lisinopril (ZESTRIL) 5 MG tablet Take 5 mg by mouth daily.      methocarbamol (ROBAXIN) 750 MG tablet Take 750 mg by mouth at bedtime.      Multiple Vitamins-Minerals (HAIR SKIN NAILS  PO) Take 1 tablet by mouth in the morning, at noon, and at bedtime.      Nutritional Supplements (ESTROVEN PO) Take 1 tablet by mouth in the morning and at bedtime.      Omega-3 1000 MG CAPS Take 1,000 mg by mouth 3 (three) times daily.      rosuvastatin (CRESTOR) 40 MG tablet Take 40 mg by mouth daily with supper.      traZODone (DESYREL) 50 MG tablet Take 50 mg by mouth at bedtime.      amoxicillin (AMOXIL) 500 MG capsule Take 500 mg by mouth 4 (four) times daily.      COVID-19 mRNA bivalent vaccine, Pfizer, (PFIZER COVID-19 VAC BIVALENT) injection Inject into the muscle. (Patient not taking: Reported on 02/24/2023) 0.3 mL 0    COVID-19 mRNA Vac-TriS, Pfizer, (PFIZER-BIONT COVID-19 VAC-TRIS) SUSP injection Inject into the muscle. (Patient not taking: Reported on 02/24/2023) 0.3 mL 0    COVID-19 mRNA vaccine 2023-2024 (COMIRNATY) syringe Inject into the muscle. (Patient not taking: Reported on 02/24/2023) 0.3 mL 0    gabapentin (NEURONTIN) 100 MG capsule TAKE 3 CAPSULES(300 MG) BY MOUTH THREE TIMES DAILY (Patient not taking: Reported on 05/21/2023) 90 capsule 0    methocarbamol (ROBAXIN) 500 MG tablet TAKE 1 TABLET(500 MG) BY MOUTH EVERY 6 HOURS AS NEEDED FOR MUSCLE SPASMS (Patient not taking: Reported on 07/07/2023) 40 tablet 0 Not Taking   oxyCODONE (OXY IR/ROXICODONE) 5 MG immediate release tablet Take 1-2 tablets (5-10 mg total) by mouth every 4 (four) hours as needed for moderate pain (pain score 4-6). (Patient not taking: Reported on 05/21/2023) 40 tablet 0    No Known Allergies  Social History   Tobacco Use   Smoking status: Former    Current packs/day: 0.00    Average packs/day: 0.3 packs/day for 15.0 years (3.8 ttl pk-yrs)    Types: Cigarettes    Start date: 05/24/1962    Quit date: 05/24/1977    Years since quitting: 46.1   Smokeless tobacco: Never  Substance Use Topics   Alcohol use: No    Family History  Problem Relation Age of Onset   Breast cancer Sister      Review of  Systems  Objective:  Physical Exam Vitals reviewed.  Constitutional:      Appearance: Normal appearance. She is normal weight.  HENT:     Head: Normocephalic and atraumatic.  Eyes:     Extraocular Movements: Extraocular movements intact.     Pupils: Pupils are equal, round, and reactive to light.  Cardiovascular:     Rate and Rhythm: Normal rate.     Pulses: Normal pulses.  Pulmonary:     Effort: Pulmonary effort is normal.  Abdominal:     Palpations: Abdomen is soft.  Musculoskeletal:     Cervical back: Normal range of motion.     Right  hip: Tenderness and bony tenderness present. Decreased range of motion. Decreased strength.  Neurological:     Mental Status: She is alert and oriented to person, place, and time.  Psychiatric:        Behavior: Behavior normal.     Vital signs in last 24 hours:    Labs:   Estimated body mass index is 34.93 kg/m as calculated from the following:   Height as of 07/16/23: 5\' 7"  (1.702 m).   Weight as of 07/16/23: 101.2 kg.   Imaging Review Plain radiographs demonstrate severe degenerative joint disease of the right hip(s). The bone quality appears to be good for age and reported activity level.      Assessment/Plan:  End stage arthritis, right hip(s)  The patient history, physical examination, clinical judgement of the provider and imaging studies are consistent with end stage degenerative joint disease of the right hip(s) and total hip arthroplasty is deemed medically necessary. The treatment options including medical management, injection therapy, arthroscopy and arthroplasty were discussed at length. The risks and benefits of total hip arthroplasty were presented and reviewed. The risks due to aseptic loosening, infection, stiffness, dislocation/subluxation,  thromboembolic complications and other imponderables were discussed.  The patient acknowledged the explanation, agreed to proceed with the plan and consent was signed. Patient  is being admitted for inpatient treatment for surgery, pain control, PT, OT, prophylactic antibiotics, VTE prophylaxis, progressive ambulation and ADL's and discharge planning.The patient is planning to be discharged home with home health services

## 2023-07-24 ENCOUNTER — Other Ambulatory Visit: Payer: Self-pay

## 2023-07-24 ENCOUNTER — Encounter (HOSPITAL_COMMUNITY)
Admission: RE | Disposition: A | Discharge status: Home Health | Payer: Self-pay | Source: Home / Self Care | Attending: Orthopaedic Surgery

## 2023-07-24 ENCOUNTER — Ambulatory Visit (HOSPITAL_COMMUNITY): Payer: Medicare Other

## 2023-07-24 ENCOUNTER — Observation Stay (HOSPITAL_COMMUNITY): Payer: Medicare Other

## 2023-07-24 ENCOUNTER — Ambulatory Visit (HOSPITAL_COMMUNITY): Payer: Medicare Other | Admitting: Physician Assistant

## 2023-07-24 ENCOUNTER — Encounter (HOSPITAL_COMMUNITY): Payer: Self-pay | Admitting: Orthopaedic Surgery

## 2023-07-24 ENCOUNTER — Ambulatory Visit (HOSPITAL_BASED_OUTPATIENT_CLINIC_OR_DEPARTMENT_OTHER): Payer: Medicare Other | Admitting: Registered Nurse

## 2023-07-24 ENCOUNTER — Observation Stay (HOSPITAL_COMMUNITY)
Admission: RE | Admit: 2023-07-24 | Discharge: 2023-07-25 | Disposition: A | Discharge status: Home Health | Payer: Medicare Other | Attending: Orthopaedic Surgery | Admitting: Orthopaedic Surgery

## 2023-07-24 DIAGNOSIS — Z79899 Other long term (current) drug therapy: Secondary | ICD-10-CM | POA: Diagnosis not present

## 2023-07-24 DIAGNOSIS — Z96651 Presence of right artificial knee joint: Secondary | ICD-10-CM | POA: Insufficient documentation

## 2023-07-24 DIAGNOSIS — Z7982 Long term (current) use of aspirin: Secondary | ICD-10-CM | POA: Insufficient documentation

## 2023-07-24 DIAGNOSIS — Z471 Aftercare following joint replacement surgery: Secondary | ICD-10-CM | POA: Diagnosis not present

## 2023-07-24 DIAGNOSIS — Z96643 Presence of artificial hip joint, bilateral: Secondary | ICD-10-CM | POA: Diagnosis not present

## 2023-07-24 DIAGNOSIS — Z87891 Personal history of nicotine dependence: Secondary | ICD-10-CM | POA: Diagnosis not present

## 2023-07-24 DIAGNOSIS — Z96641 Presence of right artificial hip joint: Secondary | ICD-10-CM | POA: Diagnosis not present

## 2023-07-24 DIAGNOSIS — Z96642 Presence of left artificial hip joint: Secondary | ICD-10-CM | POA: Insufficient documentation

## 2023-07-24 DIAGNOSIS — M1611 Unilateral primary osteoarthritis, right hip: Principal | ICD-10-CM

## 2023-07-24 HISTORY — PX: TOTAL HIP ARTHROPLASTY: SHX124

## 2023-07-24 SURGERY — ARTHROPLASTY, HIP, TOTAL, ANTERIOR APPROACH
Anesthesia: Monitor Anesthesia Care | Site: Hip | Laterality: Right

## 2023-07-24 MED ORDER — OXYCODONE HCL 5 MG PO TABS
5.0000 mg | ORAL_TABLET | ORAL | Status: DC | PRN
  Administered 2023-07-24: 10 mg via ORAL
  Administered 2023-07-24 (×2): 5 mg via ORAL
  Administered 2023-07-25 (×2): 10 mg via ORAL
  Filled 2023-07-24: qty 1
  Filled 2023-07-24 (×4): qty 2
  Filled 2023-07-24: qty 1
  Filled 2023-07-24: qty 2
  Filled 2023-07-24: qty 1

## 2023-07-24 MED ORDER — EZETIMIBE 10 MG PO TABS
10.0000 mg | ORAL_TABLET | Freq: Every day | ORAL | Status: DC
  Administered 2023-07-24: 10 mg via ORAL
  Filled 2023-07-24: qty 1

## 2023-07-24 MED ORDER — CEFAZOLIN SODIUM-DEXTROSE 2-4 GM/100ML-% IV SOLN
2.0000 g | INTRAVENOUS | Status: AC
  Administered 2023-07-24: 2 g via INTRAVENOUS
  Filled 2023-07-24: qty 100

## 2023-07-24 MED ORDER — OXYCODONE HCL 5 MG/5ML PO SOLN: 5.0000 mg | Freq: Once | ORAL | Status: DC | PRN

## 2023-07-24 MED ORDER — VITAMIN D 50 MCG (2000 UT) PO TABS: 2000.0000 [IU] | ORAL_TABLET | Freq: Every day | ORAL | Status: DC

## 2023-07-24 MED ORDER — BUPIVACAINE IN DEXTROSE 0.75-8.25 % IT SOLN
INTRATHECAL | Status: DC | PRN
  Administered 2023-07-24: 1.8 mL via INTRATHECAL

## 2023-07-24 MED ORDER — PROPOFOL 1000 MG/100ML IV EMUL
INTRAVENOUS | Status: AC
  Filled 2023-07-24: qty 100

## 2023-07-24 MED ORDER — ASPIRIN 81 MG PO CHEW
81.0000 mg | CHEWABLE_TABLET | Freq: Two times a day (BID) | ORAL | Status: DC
  Administered 2023-07-24 – 2023-07-25 (×2): 81 mg via ORAL
  Filled 2023-07-24 (×2): qty 1

## 2023-07-24 MED ORDER — MENTHOL 3 MG MT LOZG: 1.0000 | LOZENGE | OROMUCOSAL | Status: DC | PRN

## 2023-07-24 MED ORDER — ACETAMINOPHEN 10 MG/ML IV SOLN: 1000.0000 mg | Freq: Once | INTRAVENOUS | Status: DC | PRN

## 2023-07-24 MED ORDER — ALUM & MAG HYDROXIDE-SIMETH 200-200-20 MG/5ML PO SUSP: 30.0000 mL | ORAL | Status: DC | PRN

## 2023-07-24 MED ORDER — FENTANYL CITRATE PF 50 MCG/ML IJ SOSY
PREFILLED_SYRINGE | INTRAMUSCULAR | Status: AC
  Filled 2023-07-24: qty 2

## 2023-07-24 MED ORDER — OXYCODONE HCL 5 MG PO TABS
10.0000 mg | ORAL_TABLET | ORAL | Status: DC | PRN
  Administered 2023-07-24 – 2023-07-25 (×2): 10 mg via ORAL

## 2023-07-24 MED ORDER — CEFAZOLIN SODIUM-DEXTROSE 1-4 GM/50ML-% IV SOLN
1.0000 g | Freq: Four times a day (QID) | INTRAVENOUS | Status: AC
  Administered 2023-07-24 (×2): 1 g via INTRAVENOUS
  Filled 2023-07-24 (×2): qty 50

## 2023-07-24 MED ORDER — TRANEXAMIC ACID-NACL 1000-0.7 MG/100ML-% IV SOLN
1000.0000 mg | INTRAVENOUS | Status: AC
  Administered 2023-07-24: 1000 mg via INTRAVENOUS
  Filled 2023-07-24: qty 100

## 2023-07-24 MED ORDER — BUSPIRONE HCL 5 MG PO TABS
7.5000 mg | ORAL_TABLET | Freq: Two times a day (BID) | ORAL | Status: DC
  Administered 2023-07-24 – 2023-07-25 (×2): 7.5 mg via ORAL
  Filled 2023-07-24 (×2): qty 2

## 2023-07-24 MED ORDER — PHENYLEPHRINE HCL-NACL 20-0.9 MG/250ML-% IV SOLN
INTRAVENOUS | Status: DC | PRN
Start: 2023-07-24 — End: 2023-07-24
  Administered 2023-07-24: 25 ug/min via INTRAVENOUS

## 2023-07-24 MED ORDER — TRAZODONE HCL 50 MG PO TABS
50.0000 mg | ORAL_TABLET | Freq: Every day | ORAL | Status: DC
  Administered 2023-07-24: 50 mg via ORAL
  Filled 2023-07-24: qty 1

## 2023-07-24 MED ORDER — LISINOPRIL 5 MG PO TABS
5.0000 mg | ORAL_TABLET | Freq: Every day | ORAL | Status: DC
  Administered 2023-07-24 – 2023-07-25 (×2): 5 mg via ORAL
  Filled 2023-07-24 (×2): qty 1

## 2023-07-24 MED ORDER — METHOCARBAMOL 500 MG PO TABS
500.0000 mg | ORAL_TABLET | Freq: Four times a day (QID) | ORAL | Status: DC | PRN
  Administered 2023-07-24 (×2): 500 mg via ORAL
  Filled 2023-07-24 (×2): qty 1

## 2023-07-24 MED ORDER — ROSUVASTATIN CALCIUM 20 MG PO TABS
40.0000 mg | ORAL_TABLET | Freq: Every day | ORAL | Status: DC
  Administered 2023-07-24: 40 mg via ORAL
  Filled 2023-07-24: qty 2

## 2023-07-24 MED ORDER — ACETAMINOPHEN 500 MG PO TABS: 1000.0000 mg | ORAL_TABLET | Freq: Once | ORAL | Status: DC | PRN

## 2023-07-24 MED ORDER — VITAMIN D 25 MCG (1000 UNIT) PO TABS
2000.0000 [IU] | ORAL_TABLET | Freq: Every day | ORAL | Status: DC
  Administered 2023-07-25: 2000 [IU] via ORAL
  Filled 2023-07-24: qty 2

## 2023-07-24 MED ORDER — DEXAMETHASONE SODIUM PHOSPHATE 10 MG/ML IJ SOLN
INTRAMUSCULAR | Status: DC | PRN
  Administered 2023-07-24: 8 mg via INTRAVENOUS

## 2023-07-24 MED ORDER — OXYCODONE HCL 5 MG PO TABS: 5.0000 mg | ORAL_TABLET | Freq: Once | ORAL | Status: DC | PRN

## 2023-07-24 MED ORDER — DOCUSATE SODIUM 100 MG PO CAPS
100.0000 mg | ORAL_CAPSULE | Freq: Two times a day (BID) | ORAL | Status: DC
  Administered 2023-07-24 – 2023-07-25 (×2): 100 mg via ORAL
  Filled 2023-07-24 (×2): qty 1

## 2023-07-24 MED ORDER — LACTATED RINGERS IV SOLN: INTRAVENOUS | Status: DC

## 2023-07-24 MED ORDER — FENTANYL CITRATE (PF) 100 MCG/2ML IJ SOLN
INTRAMUSCULAR | Status: DC | PRN
  Administered 2023-07-24: 50 ug via INTRAVENOUS

## 2023-07-24 MED ORDER — PHENOL 1.4 % MT LIQD: 1.0000 | OROMUCOSAL | Status: DC | PRN

## 2023-07-24 MED ORDER — ACETAMINOPHEN 10 MG/ML IV SOLN
INTRAVENOUS | Status: AC
  Filled 2023-07-24: qty 100

## 2023-07-24 MED ORDER — VITAMIN C 500 MG PO TABS
1000.0000 mg | ORAL_TABLET | Freq: Every day | ORAL | Status: DC
  Administered 2023-07-25: 1000 mg via ORAL
  Filled 2023-07-24: qty 2

## 2023-07-24 MED ORDER — DEXAMETHASONE SODIUM PHOSPHATE 10 MG/ML IJ SOLN
INTRAMUSCULAR | Status: AC
  Filled 2023-07-24: qty 1

## 2023-07-24 MED ORDER — ONDANSETRON HCL 4 MG/2ML IJ SOLN
4.0000 mg | Freq: Four times a day (QID) | INTRAMUSCULAR | Status: DC | PRN
  Administered 2023-07-25 (×2): 4 mg via INTRAVENOUS
  Filled 2023-07-24 (×2): qty 2

## 2023-07-24 MED ORDER — ONDANSETRON HCL 4 MG/2ML IJ SOLN
INTRAMUSCULAR | Status: DC | PRN
  Administered 2023-07-24: 4 mg via INTRAVENOUS

## 2023-07-24 MED ORDER — ONDANSETRON HCL 4 MG PO TABS: 4.0000 mg | ORAL_TABLET | Freq: Four times a day (QID) | ORAL | Status: DC | PRN

## 2023-07-24 MED ORDER — PROPOFOL 500 MG/50ML IV EMUL
INTRAVENOUS | Status: DC | PRN
  Administered 2023-07-24: 40 ug/kg/min via INTRAVENOUS

## 2023-07-24 MED ORDER — POVIDONE-IODINE 10 % EX SWAB: 2.0000 | Freq: Once | CUTANEOUS | Status: DC

## 2023-07-24 MED ORDER — METHOCARBAMOL 1000 MG/10ML IJ SOLN: 500.0000 mg | Freq: Four times a day (QID) | INTRAVENOUS | Status: DC | PRN

## 2023-07-24 MED ORDER — METOCLOPRAMIDE HCL 5 MG/ML IJ SOLN
5.0000 mg | Freq: Three times a day (TID) | INTRAMUSCULAR | Status: DC | PRN
  Administered 2023-07-25: 10 mg via INTRAVENOUS
  Filled 2023-07-24: qty 2

## 2023-07-24 MED ORDER — ONDANSETRON HCL 4 MG/2ML IJ SOLN
INTRAMUSCULAR | Status: AC
  Filled 2023-07-24: qty 2

## 2023-07-24 MED ORDER — DIPHENHYDRAMINE HCL 12.5 MG/5ML PO ELIX: 12.5000 mg | ORAL_SOLUTION | ORAL | Status: DC | PRN

## 2023-07-24 MED ORDER — HYDROMORPHONE HCL 1 MG/ML IJ SOLN: 0.5000 mg | INTRAMUSCULAR | Status: DC | PRN

## 2023-07-24 MED ORDER — ACETAMINOPHEN 10 MG/ML IV SOLN
INTRAVENOUS | Status: DC | PRN
  Administered 2023-07-24: 1000 mg via INTRAVENOUS

## 2023-07-24 MED ORDER — SODIUM CHLORIDE 0.9 % IV SOLN: INTRAVENOUS | Status: DC

## 2023-07-24 MED ORDER — CHLORHEXIDINE GLUCONATE 0.12 % MT SOLN
15.0000 mL | Freq: Once | OROMUCOSAL | Status: AC
  Administered 2023-07-24: 15 mL via OROMUCOSAL

## 2023-07-24 MED ORDER — FENTANYL CITRATE (PF) 100 MCG/2ML IJ SOLN
INTRAMUSCULAR | Status: AC
  Filled 2023-07-24: qty 2

## 2023-07-24 MED ORDER — BIOTIN 5000 MCG PO CAPS: 5000.0000 ug | ORAL_CAPSULE | Freq: Every day | ORAL | Status: DC

## 2023-07-24 MED ORDER — VITAMIN C 500 MG PO TABS: 1000.0000 mg | ORAL_TABLET | Freq: Every morning | ORAL | Status: DC

## 2023-07-24 MED ORDER — PANTOPRAZOLE SODIUM 40 MG PO TBEC
40.0000 mg | DELAYED_RELEASE_TABLET | Freq: Every day | ORAL | Status: DC
  Administered 2023-07-24 – 2023-07-25 (×2): 40 mg via ORAL
  Filled 2023-07-24 (×2): qty 1

## 2023-07-24 MED ORDER — FENTANYL CITRATE PF 50 MCG/ML IJ SOSY
25.0000 ug | PREFILLED_SYRINGE | INTRAMUSCULAR | Status: DC | PRN
  Administered 2023-07-24 (×2): 50 ug via INTRAVENOUS

## 2023-07-24 MED ORDER — ACETAMINOPHEN 325 MG PO TABS: 325.0000 mg | ORAL_TABLET | Freq: Four times a day (QID) | ORAL | Status: DC | PRN

## 2023-07-24 MED ORDER — ACETAMINOPHEN 160 MG/5ML PO SOLN: 1000.0000 mg | Freq: Once | ORAL | Status: DC | PRN

## 2023-07-24 MED ORDER — ORAL CARE MOUTH RINSE: 15.0000 mL | Freq: Once | OROMUCOSAL | Status: AC

## 2023-07-24 MED ORDER — METOCLOPRAMIDE HCL 5 MG PO TABS: 5.0000 mg | ORAL_TABLET | Freq: Three times a day (TID) | ORAL | Status: DC | PRN

## 2023-07-24 SURGICAL SUPPLY — 42 items
ACETAB CUP W/GRIPTION 54 (Plate) ×1 IMPLANT
APL SKNCLS STERI-STRIP NONHPOA (GAUZE/BANDAGES/DRESSINGS)
BAG COUNTER SPONGE SURGICOUNT (BAG) ×2 IMPLANT
BAG SPEC THK2 15X12 ZIP CLS (MISCELLANEOUS)
BAG SPNG CNTER NS LX DISP (BAG) ×1
BAG ZIPLOCK 12X15 (MISCELLANEOUS) IMPLANT
BENZOIN TINCTURE PRP APPL 2/3 (GAUZE/BANDAGES/DRESSINGS) IMPLANT
BLADE SAW SGTL 18X1.27X75 (BLADE) ×2 IMPLANT
COVER PERINEAL POST (MISCELLANEOUS) ×2 IMPLANT
COVER SURGICAL LIGHT HANDLE (MISCELLANEOUS) ×2 IMPLANT
CUP ACETAB W/GRIPTION 54 (Plate) IMPLANT
DRAPE FOOT SWITCH (DRAPES) ×2 IMPLANT
DRAPE STERI IOBAN 125X83 (DRAPES) ×2 IMPLANT
DRAPE U-SHAPE 47X51 STRL (DRAPES) ×4 IMPLANT
DRSG AQUACEL AG ADV 3.5X10 (GAUZE/BANDAGES/DRESSINGS) ×2 IMPLANT
DURAPREP 26ML APPLICATOR (WOUND CARE) ×2 IMPLANT
ELECT REM PT RETURN 15FT ADLT (MISCELLANEOUS) ×2 IMPLANT
GAUZE XEROFORM 1X8 LF (GAUZE/BANDAGES/DRESSINGS) IMPLANT
GLOVE BIO SURGEON STRL SZ7.5 (GLOVE) ×2 IMPLANT
GLOVE BIOGEL PI IND STRL 8 (GLOVE) ×4 IMPLANT
GLOVE ECLIPSE 8.0 STRL XLNG CF (GLOVE) ×2 IMPLANT
GOWN STRL REUS W/ TWL XL LVL3 (GOWN DISPOSABLE) ×4 IMPLANT
GOWN STRL REUS W/TWL XL LVL3 (GOWN DISPOSABLE) ×2
HANDPIECE INTERPULSE COAX TIP (DISPOSABLE) ×1
HEAD CERAMIC DELTA 36 PLUS 1.5 (Hips) IMPLANT
HOLDER FOLEY CATH W/STRAP (MISCELLANEOUS) ×2 IMPLANT
KIT TURNOVER KIT A (KITS) IMPLANT
LINER NEUTRAL 36ID 54OD (Liner) IMPLANT
PACK ANTERIOR HIP CUSTOM (KITS) ×2 IMPLANT
SET HNDPC FAN SPRY TIP SCT (DISPOSABLE) ×2 IMPLANT
STAPLER VISISTAT 35W (STAPLE) IMPLANT
STEM CORAIL KA12 (Stem) IMPLANT
STRIP CLOSURE SKIN 1/2X4 (GAUZE/BANDAGES/DRESSINGS) IMPLANT
SUT ETHIBOND NAB CT1 #1 30IN (SUTURE) ×2 IMPLANT
SUT ETHILON 2 0 PS N (SUTURE) IMPLANT
SUT MNCRL AB 4-0 PS2 18 (SUTURE) IMPLANT
SUT VIC AB 0 CT1 36 (SUTURE) ×2 IMPLANT
SUT VIC AB 1 CT1 36 (SUTURE) ×2 IMPLANT
SUT VIC AB 2-0 CT1 27 (SUTURE) ×2
SUT VIC AB 2-0 CT1 TAPERPNT 27 (SUTURE) ×4 IMPLANT
TRAY FOLEY MTR SLVR 16FR STAT (SET/KITS/TRAYS/PACK) IMPLANT
YANKAUER SUCT BULB TIP NO VENT (SUCTIONS) ×2 IMPLANT

## 2023-07-24 NOTE — Anesthesia Postprocedure Evaluation (Signed)
Anesthesia Post Note  Patient: Bianca Ware  Procedure(s) Performed: RIGHT TOTAL HIP ARTHROPLASTY ANTERIOR APPROACH (Right: Hip)     Patient location during evaluation: PACU Anesthesia Type: MAC and Spinal Level of consciousness: awake and alert Pain management: pain level controlled Vital Signs Assessment: post-procedure vital signs reviewed and stable Respiratory status: spontaneous breathing, nonlabored ventilation and respiratory function stable Cardiovascular status: stable and blood pressure returned to baseline Postop Assessment: no apparent nausea or vomiting Anesthetic complications: no   No notable events documented.  Last Vitals:  Vitals:   07/24/23 1355 07/24/23 1627  BP: (!) 147/56 (!) 157/56  Pulse: 77 72  Resp: 16 17  Temp: 36.7 C 37.1 C  SpO2: 96%     Last Pain:  Vitals:   07/24/23 1627  TempSrc: Oral  PainSc:                  [name]

## 2023-07-24 NOTE — Progress Notes (Signed)
PHARMACIST - PHYSICIAN ORDER COMMUNICATION  CONCERNING: P&T Medication Policy on Herbal Medications  DESCRIPTION:  This patient's order for:  Biotin  has been noted.  This product(s) is classified as an "herbal" or natural product. Due to a lack of definitive safety studies or FDA approval, nonstandard manufacturing practices, plus the potential risk of unknown drug-drug interactions while on inpatient medications, the Pharmacy and Therapeutics Committee does not permit the use of "herbal" or natural products of this type within [facility].   ACTION TAKEN: The pharmacy department is unable to verify this order at this time and your patient has been informed of this safety policy. Please reevaluate patient's clinical condition at discharge and address if the herbal or natural product(s) should be resumed at that time.   Dorna Leitz, PharmD, BCPS 07/24/2023 12:09 PM

## 2023-07-24 NOTE — Plan of Care (Signed)
?  Problem: Education: ?Goal: Knowledge of the prescribed therapeutic regimen will improve ?Outcome: Progressing ?  ?Problem: Clinical Measurements: ?Goal: Postoperative complications will be avoided or minimized ?Outcome: Progressing ?  ?Problem: Pain Management: ?Goal: Pain level will decrease with appropriate interventions ?Outcome: Progressing ?  ?Problem: Skin Integrity: ?Goal: Will show signs of wound healing ?Outcome: Progressing ?  ?

## 2023-07-24 NOTE — Transfer of Care (Signed)
Immediate Anesthesia Transfer of Care Note  Patient: LYNETT ROHRER  Procedure(s) Performed: RIGHT TOTAL HIP ARTHROPLASTY ANTERIOR APPROACH (Right: Hip)  Patient Location: PACU  Anesthesia Type:MAC and Spinal  Level of Consciousness: awake, alert , oriented, and patient cooperative  Airway & Oxygen Therapy: Patient Spontanous Breathing and Patient connected to face mask oxygen  Post-op Assessment: Report given to RN and Post -op Vital signs reviewed and stable  Post vital signs: Reviewed and stable  Last Vitals:  Vitals Value Taken Time  BP 132/63 07/24/23 1030  Temp    Pulse 60 07/24/23 1030  Resp 13 07/24/23 1030  SpO2 100 % 07/24/23 1030  Vitals shown include unfiled device data.  Last Pain:  Vitals:   07/24/23 1610  TempSrc: Oral  PainSc:          Complications: No notable events documented.

## 2023-07-24 NOTE — Evaluation (Signed)
Physical Therapy Evaluation Patient Details Name: Bianca Ware MRN: [id number] DOB: 11/28/1945 Today's Date: 07/24/2023  History of Present Illness  77 yo female presents to therapy s/p R THA, anterior approach on 07/24/2023 due to failure of conservative measures. Pt PMH includes but is not limited to: R TKA (2019), L THA, AA (2016), spondylolisthesis of lumbar region with disc herniation and lumbar laminectomy.  Clinical Impression      Bianca Ware is a 77 y.o. female POD 0 s/p R THA. Patient reports mod I with occasional use of SPC with mobility at baseline. Patient is now limited by functional impairments (see PT problem list below) and requires CGA for bed mobility and CGA and cues for transfers. Patient was able to ambulate 40 feet with RW and CGA level of assist. Patient instructed in exercise to facilitate ROM and circulation to manage edema.  Patient will benefit from continued skilled PT interventions to address impairments and progress towards PLOF. Acute PT will follow to progress mobility and stair training in preparation for safe discharge home with family support and [facility] services.     If plan is discharge home, recommend the following: A little help with walking and/or transfers;A little help with bathing/dressing/bathroom;Assistance with cooking/housework;Assist for transportation;Help with stairs or ramp for entrance   Can travel by private vehicle        Equipment Recommendations None recommended by PT  Recommendations for Other Services       Functional Status Assessment Patient has had a recent decline in their functional status and demonstrates the ability to make significant improvements in function in a reasonable and predictable amount of time.     Precautions / Restrictions Precautions Precautions: Fall Restrictions Weight Bearing Restrictions: No      Mobility  Bed Mobility Overal bed mobility: Needs Assistance Bed Mobility: Supine to Sit      Supine to sit: Used rails, HOB elevated, Contact guard     General bed mobility comments: min cues a\    Transfers Overall transfer level: Needs assistance Equipment used: Rolling walker (2 wheels) Transfers: Sit to/from Stand Sit to Stand: Contact guard assist, From elevated surface           General transfer comment: min cues for proper technique. once in standing pain increased from 2/10 to 7/10 with R LE WB    Ambulation/Gait Ambulation/Gait assistance: Contact guard assist Gait Distance (Feet): 40 Feet Assistive device: Rolling walker (2 wheels) Gait Pattern/deviations: Step-to pattern, Antalgic Gait velocity: decresed     General Gait Details: increased lateral sway and limited R stance time  Stairs            Wheelchair Mobility     Tilt Bed    Modified Rankin (Stroke Patients Only)       Balance Overall balance assessment: Needs assistance Sitting-balance support: Feet supported Sitting balance-Leahy Scale: Good     Standing balance support: Bilateral upper extremity supported, During functional activity, Reliant on assistive device for balance Standing balance-Leahy Scale: Poor                               Pertinent Vitals/Pain Pain Assessment Pain Assessment: 0-10 Pain Score: 7  Pain Location: R hip and leg Pain Descriptors / Indicators: Aching, Burning, Discomfort, Constant, Operative site guarding Pain Intervention(s): Limited activity within patient's tolerance, Monitored during session, Premedicated before session, Repositioned, Ice applied    Home Living Family/patient expects to be  discharged to:: Private residence Living Arrangements: Spouse/significant other Available Help at Discharge: Family;Friend(s) Type of Home: House Home Access: Stairs to enter Entrance Stairs-Rails: Right;Left;Can reach both Entrance Stairs-Number of Steps: 2  + 1 (2 steps then a landing)   Home Layout: One level Home Equipment:  Agricultural consultant (2 wheels);Cane - single point;BSC/3in1      Prior Function Prior Level of Function : Independent/Modified Independent;Driving             Mobility Comments: mod I with intermittent use of SPC due to instabilty and pain  for all ALDs, self care tasks and IADLs       Extremity/Trunk Assessment        Lower Extremity Assessment Lower Extremity Assessment: RLE deficits/detail RLE Deficits / Details: ankle DF/PF 5/5 RLE Sensation: decreased light touch (pt indicated abn sensation buttock)    Cervical / Trunk Assessment Cervical / Trunk Assessment: Back Surgery  Communication   Communication Communication: No apparent difficulties  Cognition Arousal: Alert Behavior During Therapy: WFL for tasks assessed/performed Overall Cognitive Status: Within Functional Limits for tasks assessed                                          General Comments      Exercises Total Joint Exercises Ankle Circles/Pumps: AROM, Both, 20 reps Quad Sets: AROM, Right, 5 reps   Assessment/Plan    PT Assessment Patient needs continued PT services  PT Problem List Decreased strength;Decreased range of motion;Decreased activity tolerance;Decreased balance;Decreased mobility;Decreased coordination;Pain       PT Treatment Interventions DME instruction;Gait training;Stair training;Functional mobility training;Therapeutic activities;Therapeutic exercise;Neuromuscular re-education;Balance training;Patient/family education;Modalities    PT Goals (Current goals can be found in the Care Plan section)  Acute Rehab PT Goals Patient Stated Goal: to be able to walk on the treadmill and get back to doing everything I used to do PT Goal Formulation: With patient Time For Goal Achievement: 08/07/23 Potential to Achieve Goals: Good    Frequency 7X/week     Co-evaluation               AM-PAC PT "6 Clicks" Mobility  Outcome Measure Help needed turning from your back  to your side while in a flat bed without using bedrails?: None Help needed moving from lying on your back to sitting on the side of a flat bed without using bedrails?: A Little Help needed moving to and from a bed to a chair (including a wheelchair)?: A Little Help needed standing up from a chair using your arms (e.g., wheelchair or bedside chair)?: A Little Help needed to walk in hospital room?: A Little Help needed climbing 3-5 steps with a railing? : A Lot 6 Click Score: 18    End of Session Equipment Utilized During Treatment: Gait belt Activity Tolerance: Patient limited by pain Patient left: in chair;with call bell/phone within reach;with chair alarm set;with family/visitor present Nurse Communication: Mobility status PT Visit Diagnosis: Unsteadiness on feet (R26.81);Muscle weakness (generalized) (M62.81);Difficulty in walking, not elsewhere classified (R26.2);Pain Pain - Right/Left: Right Pain - part of body: Hip;Leg    Time: 3086-5784 PT Time Calculation (min) (ACUTE ONLY): 33 min   Charges:   PT Evaluation $PT Eval Low Complexity: 1 Low PT Treatments $Gait Training: 8-22 mins PT General Charges $$ ACUTE PT VISIT: 1 Visit         Johnny Bridge, PT Acute Rehab  Jacqualyn Posey 07/24/2023, 4:09 PM

## 2023-07-24 NOTE — Op Note (Signed)
Operative Note  Date of operation: 07/24/2023 Preoperative diagnosis: Right hip primary osteoarthritis Postoperative diagnosis: Same  Procedure: Right direct anterior total hip arthroplasty  Implants: Implant Name Type Inv. Item Serial No. Manufacturer Lot No. LRB No. Used Action  ACETAB CUP W/GRIPTION 54 - [id number] Plate ACETAB CUP W/GRIPTION 54  [employer] [id number] Right 1 Implanted  LINER NEUTRAL 36ID 54OD - [id number] Liner LINER NEUTRAL 36ID 54OD  DEPUY [employer]TH[employer] [id number] Right 1 Implanted  STEM CORAIL KA12 - [id number] Stem STEM CORAIL KA12  [employer]PU[employer]TH[employer] [id number] Right 1 Implanted  HEAD CERAMIC DELTA 36 PLUS 1.5 - [id number] Hips HEAD CERAMIC DELTA 36 PLUS 1.5  [employer]PU[employer]TH[employer] [id number] Right 1 Implanted   Surgeon: Vanita Panda. Magnus Ivan, MD Assistant: Rexene Edison, PA-C  Anesthesia: Spinal EBL: 100 cc Antibiotics: 2 g IV Ancef Complications: None  Indications: The patient is a 77 year old female well-known to me.  Bianca Ware has well-documented debilitating arthritis involving her right hip.  Her x-rays show complete loss of the joint space with bone-on-bone wear.  Bianca Ware has tried and failed conservative treatment for over a year now.  Her right hip pain is daily and is detrimentally affecting her mobility, her quality of life and her actives daily living.  We have seen her some time for this right hip.  We actually replaced her left hip successfully 2016.  Having had this before Bianca Ware is fully aware of the risk of acute blood loss anemia, nerve vessel injury, fracture, infection, DVT, dislocation, implant failure, leg length differences and wound healing issues.  Bianca Ware understands her goals are hopefully decrease pain, improve mobility, and improve quality of life.  Procedure description: After informed consent was obtained and the appropriate right hip was marked, the patient was brought to the operating room and set up on the stretcher where spinal anesthesia was obtained.  Bianca Ware  was then laid in supine position on stretcher and a Foley catheter was placed.  Traction boots were placed on both her feet and Bianca Ware was placed supine on the Hana fracture table with a perineal post in place in both legs and inline skeletal traction devices but no traction applied.  Her l right operative hip was assessed radiographically as well as the pelvis.  The right operative hip was prepped and draped in DuraPrep and sterile drapes.  A timeout was called and Bianca Ware was identified as the correct patient and correct right hip.  An incision was then made just inferior and posterior to the ASIS and carried slightly obliquely down the leg.  Dissection was carried down to the tensor fascia lata muscle and the tensor fascia was divided longitudinally to proceed with a direct anterior approach to the hip.  Circumflex vessels were identified and cauterized.  The hip capsule was identified and opened up in L-type format finding a moderate joint effusion.  Cobra retractors were placed around the medial and lateral femoral neck and a femoral neck cut was made with an oscillating saw just proximal to the lesser trochanter.  This cut was completed with an osteotome.  A corkscrew guide was placed in the femoral head and the femoral head was removed in its entirety and there was a wide area devoid of cartilage.  A bent Hohmann was then placed over the medial acetabular rim and remnants of the acetabular labrum and other debris were removed.  Reaming was then initiated under stepwise increments from a size 43 reamer going up to a size 53 reamer with all reamers placed under direct visualization  and the last reamer placed under direct fluoroscopy in order to obtain the depth and reaming, the inclination and the anteversion.  The real DePuy sector GRIPTION acetabular and a size 54 was then placed without difficulty followed by 36+0 polythene liner based off offset.  Attention was then turned to the femur.  With the right leg  externally rotated to 120 degrees, extended and adducted, a Mueller retractor was placed medially and a Hohmann retractor was placed behind the greater trochanter.  The lateral joint capsule was released and a box cutting osteotome was used to enter the femoral canal.  Broaching was then initiated using the Corail broaching system from a size 8 going up to a size 12.  With a size 12 in place we trialed a standard offset femoral neck and a 36+1.5 trial hip ball.  The right leg was brought over and up and with traction and internal rotation reduced in the pelvis.  We assessed her for stability and assess leg length and offset.  Bianca Ware is just slightly longer but it was stable and we could not go shoulder because of instability.  We dislocated the hip and removed the trial components.  We placed the real femoral component size 12 with standard offset and the real 36+1.5 ceramic hip ball.  Again this was reduced and acetabulum and we are pleased with leg length, offset, range of motion and especially stability.  The soft tissue was then irrigated with normal saline solution.  The joint capsule was closed with interrupted #1 Ethibond suture followed by #1 Vicryl to close the tensor fascia.  0 Vicryl was used to close the deep tissue and 2-0 Vicryl's used to close subcutaneous tissue.  The skin was closed with staples.  Aquacel dressing was applied.  The patient was taken the recovery room in stable condition.  Rexene Edison, PA-C did assist during the entire case and beginning to end and his assistance was medically necessary and crucial for soft tissue management and retraction, helping guide implant placement and a layered closure of the wound.

## 2023-07-24 NOTE — Interval H&P Note (Signed)
History and Physical Interval Note: The patient is here today for a right total hip replacement to treat her severe right hip arthritis.  There has been no acute or interval change in her medical status.  The risks and benefits of surgery been discussed in detail and informed consent has been obtained.  The right operative hip has been marked.  07/24/2023 7:00 AM  Bianca Ware  has presented today for surgery, with the diagnosis of osteoarthritis right hip.  The various methods of treatment have been discussed with the patient and family. After consideration of risks, benefits and other options for treatment, the patient has consented to  Procedure(s): RIGHT TOTAL HIP ARTHROPLASTY ANTERIOR APPROACH (Right) as a surgical intervention.  The patient's history has been reviewed, patient examined, no change in status, stable for surgery.  I have reviewed the patient's chart and labs.  Questions were answered to the patient's satisfaction.     Kathryne Hitch

## 2023-07-24 NOTE — TOC Transition Note (Signed)
Transition of Care [facility]) - CM/SW Discharge Note   Patient Details  Name: Bianca Ware MRN: [id number] Date of Birth: 1946/09/15  [facility] CM/SW Contact:  Amada Jupiter, LCSW Phone Number: 07/24/2023, 2:37 PM   Clinical Narrative:    Met with pt and confirming she has needed DME in the home.  HHPT prearranged with [facility] via MD office.  No TOC needs.   Final next level of care: Home w Home Health Services Barriers to Discharge: No Barriers Identified   Patient Goals and CMS Choice      Discharge Placement                         Discharge Plan and Services Additional resources added to the After Visit Summary for                  DME Arranged: N/A DME Agency: NA       HH Arranged: PT HH Agency: [facility]        Social Determinants of Health (SDOH) Interventions SDOH Screenings   Tobacco Use: Medium Risk (07/24/2023)     Readmission Risk Interventions     No data to display

## 2023-07-24 NOTE — Anesthesia Procedure Notes (Signed)
Procedure Name: MAC Date/Time: 07/24/2023 8:39 AM  Performed by: Elisabeth Cara, CRNAPre-anesthesia Checklist: Patient identified, Emergency Drugs available, Suction available, Patient being monitored and Timeout performed Patient Re-evaluated:Patient Re-evaluated prior to induction Oxygen Delivery Method: Simple face mask Placement Confirmation: positive ETCO2 Dental Injury: Teeth and Oropharynx as per pre-operative assessment

## 2023-07-24 NOTE — Anesthesia Procedure Notes (Signed)
Spinal  Patient location during procedure: OR Start time: 07/24/2023 8:36 AM End time: 07/24/2023 8:49 AM Reason for block: surgical anesthesia Staffing Performed: anesthesiologist  Anesthesiologist: Val Eagle, MD Performed by: Val Eagle, MD Authorized by: Val Eagle, MD   Preanesthetic Checklist Completed: patient identified, IV checked, risks and benefits discussed, surgical consent, monitors and equipment checked, pre-op evaluation and timeout performed Spinal Block Patient position: sitting Prep: DuraPrep Patient monitoring: heart rate, cardiac monitor, continuous pulse ox and blood pressure Approach: midline Location: L4-5 Injection technique: single-shot Needle Needle type: Pencan  Needle gauge: 24 G Needle length: 9 cm Assessment Sensory level: T6 Events: CSF return and second provider Additional Notes Attempt at L2/3 and L3/4 by CRNA prior to attempt by MDA at L4/5

## 2023-07-25 ENCOUNTER — Other Ambulatory Visit (HOSPITAL_COMMUNITY): Payer: Self-pay

## 2023-07-25 DIAGNOSIS — Z79899 Other long term (current) drug therapy: Secondary | ICD-10-CM | POA: Diagnosis not present

## 2023-07-25 DIAGNOSIS — Z7982 Long term (current) use of aspirin: Secondary | ICD-10-CM | POA: Diagnosis not present

## 2023-07-25 DIAGNOSIS — Z96651 Presence of right artificial knee joint: Secondary | ICD-10-CM | POA: Diagnosis not present

## 2023-07-25 DIAGNOSIS — M1611 Unilateral primary osteoarthritis, right hip: Secondary | ICD-10-CM | POA: Diagnosis not present

## 2023-07-25 DIAGNOSIS — Z96642 Presence of left artificial hip joint: Secondary | ICD-10-CM | POA: Diagnosis not present

## 2023-07-25 DIAGNOSIS — Z87891 Personal history of nicotine dependence: Secondary | ICD-10-CM | POA: Diagnosis not present

## 2023-07-25 LAB — CBC
HCT: 34.2 % — ABNORMAL LOW (ref 36.0–46.0)
Hemoglobin: 11.3 g/dL — ABNORMAL LOW (ref 12.0–15.0)
MCH: 30.9 pg (ref 26.0–34.0)
MCHC: 33 g/dL (ref 30.0–36.0)
MCV: 93.4 fL (ref 80.0–100.0)
Platelets: 204 10*3/uL (ref 150–400)
RBC: 3.66 MIL/uL — ABNORMAL LOW (ref 3.87–5.11)
RDW: 12.4 % (ref 11.5–15.5)
WBC: 11.4 10*3/uL — ABNORMAL HIGH (ref 4.0–10.5)
nRBC: 0 % (ref 0.0–0.2)

## 2023-07-25 LAB — BASIC METABOLIC PANEL
Anion gap: 10 (ref 5–15)
BUN: 12 mg/dL (ref 8–23)
CO2: 24 mmol/L (ref 22–32)
Calcium: 8.9 mg/dL (ref 8.9–10.3)
Chloride: 102 mmol/L (ref 98–111)
Creatinine, Ser: 0.54 mg/dL (ref 0.44–1.00)
GFR, Estimated: >60 mL/min (ref >60)
Glucose, Bld: 121 mg/dL — ABNORMAL HIGH (ref 70–99)
Potassium: 3.8 mmol/L (ref 3.5–5.1)
Sodium: 136 mmol/L (ref 135–145)

## 2023-07-25 MED ORDER — ASPIRIN 81 MG PO TBEC
81.0000 mg | DELAYED_RELEASE_TABLET | Freq: Two times a day (BID) | ORAL | 12 refills | Status: DC
  Filled 2023-07-25: qty 30, 15d supply, fill #0

## 2023-07-25 MED ORDER — OXYCODONE HCL 5 MG PO TABS
5.0000 mg | ORAL_TABLET | Freq: Four times a day (QID) | ORAL | 0 refills | Status: AC | PRN
  Filled 2023-07-25: qty 30, 4d supply, fill #0

## 2023-07-25 MED ORDER — SCOPOLAMINE 1 MG/3DAYS TD PT72
1.0000 | MEDICATED_PATCH | TRANSDERMAL | 1 refills | Status: DC
Start: 2023-07-25 — End: 2024-04-11
  Filled 2023-07-25: qty 5, 15d supply, fill #0

## 2023-07-25 MED ORDER — GABAPENTIN 600 MG PO TABS
600.0000 mg | ORAL_TABLET | Freq: Two times a day (BID) | ORAL | 0 refills | Status: DC | PRN
  Filled 2023-07-25: qty 60, 30d supply, fill #0

## 2023-07-25 NOTE — Progress Notes (Signed)
Subjective: 1 Day Post-Op Procedure(s) (LRB): RIGHT TOTAL HIP ARTHROPLASTY ANTERIOR APPROACH (Right) Patient reports pain as moderate.    Objective: Vital signs in last 24 hours: Temp:  [97.5 F (36.4 C)-99 F (37.2 C)] 98.9 F (37.2 C) (08/31 0846) Pulse Rate:  [57-77] 65 (08/31 0846) Resp:  [11-18] 18 (08/31 0846) BP: (111-167)/(42-71) 132/47 (08/31 0846) SpO2:  [94 %-100 %] 98 % (08/31 0846)  Intake/Output from previous day: 08/30 0701 - 08/31 0700 In: 3747.8 [P.O.:540; I.V.:2824.9; IV Piggyback:382.9] Out: 2900 [Urine:2800; Blood:100] Intake/Output this shift: Total I/O In: 240 [P.O.:240] Out: 400 [Urine:400]  Recent Labs    07/25/23 0357  HGB 11.3*   Recent Labs    07/25/23 0357  WBC 11.4*  RBC 3.66*  HCT 34.2*  PLT 204   Recent Labs    07/25/23 0357  NA 136  K 3.8  CL 102  CO2 24  BUN 12  CREATININE 0.54  GLUCOSE 121*  CALCIUM 8.9   No results for input(s): "LABPT", "INR" in the last 72 hours.  Sensation intact distally Intact pulses distally Dorsiflexion/Plantar flexion intact Incision: scant drainage   Assessment/Plan: 1 Day Post-Op Procedure(s) (LRB): RIGHT TOTAL HIP ARTHROPLASTY ANTERIOR APPROACH (Right) Up with therapy Discharge home with home health      Kathryne Hitch 07/25/2023, 10:47 AM

## 2023-07-25 NOTE — Progress Notes (Signed)
Provided discharge education/instructions, all questions and concerns addressed. Pt is not in any distress, discharged home with all of her belongings accompanied by husband.

## 2023-07-25 NOTE — Plan of Care (Signed)

## 2023-07-25 NOTE — Progress Notes (Signed)
Physical Therapy Treatment Patient Details Name: Bianca Ware MRN: [id number] DOB: 29-Dec-1945 Today's Date: 07/25/2023   History of Present Illness 77 yo female presents to therapy s/p R THA, anterior approach on 07/24/2023 due to failure of conservative measures. Pt PMH includes but is not limited to: R TKA (2019), L THA, AA (2016), spondylolisthesis of lumbar region with disc herniation and lumbar laminectomy.    PT Comments  Pt ambulated in hallway, practiced steps, and performed LE exercises.  Pt provided with HEP handout and had no further questions.  Pt feels ready for d/c home today.     If plan is discharge home, recommend the following: Assistance with cooking/housework;Assist for transportation;Help with stairs or ramp for entrance   Can travel by private vehicle        Equipment Recommendations  None recommended by PT    Recommendations for Other Services       Precautions / Restrictions Precautions Precautions: Fall Restrictions Weight Bearing Restrictions: No RLE Weight Bearing: Weight bearing as tolerated     Mobility  Bed Mobility               General bed mobility comments: pt in recliner    Transfers Overall transfer level: Needs assistance Equipment used: Rolling walker (2 wheels) Transfers: Sit to/from Stand Sit to Stand: Supervision           General transfer comment: verbal cues for hand placement    Ambulation/Gait Ambulation/Gait assistance: Contact guard assist Gait Distance (Feet): 90 Feet Assistive device: Rolling walker (2 wheels) Gait Pattern/deviations: Step-to pattern, Antalgic, Decreased stance time - right Gait velocity: decreased     General Gait Details: verbal cues for sequence, RW positioning, step length, hip/knee flexion with swing   Stairs Stairs: Yes Stairs assistance: Contact guard assist Stair Management: Step to pattern, Forwards, Two rails Number of Stairs: 2 General stair comments: verbal cues for  sequence and safety, spouse observed, pt reports understanding   Wheelchair Mobility     Tilt Bed    Modified Rankin (Stroke Patients Only)       Balance                                            Cognition Arousal: Alert Behavior During Therapy: WFL for tasks assessed/performed Overall Cognitive Status: Within Functional Limits for tasks assessed                                          Exercises Total Joint Exercises Ankle Circles/Pumps: AROM, Both, 10 reps Quad Sets: AROM, Both, 10 reps Heel Slides: AAROM, Right, 10 reps Hip ABduction/ADduction: AAROM, Right, 10 reps, AROM, Supine, Standing Long Arc Quad: AROM, Right, Seated, 10 reps Knee Flexion: AROM, Right, Standing, 10 reps Marching in Standing: AROM, Right, Standing, 10 reps Standing Hip Extension: AROM, Right, Standing, 10 reps    General Comments        Pertinent Vitals/Pain Pain Assessment Pain Assessment: 0-10 Pain Score: 5  Pain Location: R hip and leg Pain Descriptors / Indicators: Burning, Operative site guarding Pain Intervention(s): Monitored during session, Repositioned, Premedicated before session    Home Living  Prior Function            PT Goals (current goals can now be found in the care plan section) Progress towards PT goals: Progressing toward goals    Frequency    7X/week      PT Plan      Co-evaluation              AM-PAC PT "6 Clicks" Mobility   Outcome Measure  Help needed turning from your back to your side while in a flat bed without using bedrails?: None Help needed moving from lying on your back to sitting on the side of a flat bed without using bedrails?: A Little Help needed moving to and from a bed to a chair (including a wheelchair)?: A Little Help needed standing up from a chair using your arms (e.g., wheelchair or bedside chair)?: A Little Help needed to walk in hospital room?: A  Little Help needed climbing 3-5 steps with a railing? : A Little 6 Click Score: 19    End of Session Equipment Utilized During Treatment: Gait belt Activity Tolerance: Patient tolerated treatment well Patient left: in chair;with call bell/phone within reach;with family/visitor present   PT Visit Diagnosis: Difficulty in walking, not elsewhere classified (R26.2)     Time: 1021-1040 PT Time Calculation (min) (ACUTE ONLY): 19 min  Charges:    $Gait Training: 8-22 mins PT General Charges $$ ACUTE PT VISIT: 1 Visit                     Paulino Door, DPT Physical Therapist Acute Rehabilitation Services Office: [phone]    Janan Halter Payson 07/25/2023, 12:49 PM

## 2023-07-25 NOTE — Discharge Instructions (Signed)

## 2023-07-25 NOTE — Discharge Summary (Signed)
Patient ID: Bianca Ware MRN: [id number] DOB/AGE: Dec 27, 1945 77 y.o.  Admit date: 07/24/2023 Discharge date: 07/25/2023  Admission Diagnoses:  Principal Problem:   Unilateral primary osteoarthritis, right hip Active Problems:   Status post total replacement of right hip   Discharge Diagnoses:  Same  Past Medical History:  Diagnosis Date   Arthritis    hands   Complication of anesthesia    waking up   Elevated coronary artery calcium score    OA (osteoarthritis) of knee    right and left   Pneumonia    PONV (postoperative nausea and vomiting)    Seasonal allergies    Wears glasses     Surgeries: Procedure(s): RIGHT TOTAL HIP ARTHROPLASTY ANTERIOR APPROACH on 07/24/2023   Consultants:   Discharged Condition: Improved  Hospital Course: Bianca Ware is an 77 y.o. female who was admitted 07/24/2023 for operative treatment ofUnilateral primary osteoarthritis, right hip. Patient has severe unremitting pain that affects sleep, daily activities, and work/hobbies. After pre-op clearance the patient was taken to the operating room on 07/24/2023 and underwent  Procedure(s): RIGHT TOTAL HIP ARTHROPLASTY ANTERIOR APPROACH.    Patient was given perioperative antibiotics:  Anti-infectives (From admission, onward)    Start     Dose/Rate Route Frequency Ordered Stop   07/24/23 1500  ceFAZolin (ANCEF) IVPB 1 g/50 mL premix        1 g 100 mL/hr over 30 Minutes Intravenous Every 6 hours 07/24/23 1201 07/24/23 2210   07/24/23 0615  ceFAZolin (ANCEF) IVPB 2g/100 mL premix        2 g 200 mL/hr over 30 Minutes Intravenous On call to O.R. 07/24/23 [id number] 07/24/23 0920        Patient was given sequential compression devices, early ambulation, and chemoprophylaxis to prevent DVT.  Patient benefited maximally from hospital stay and there were no complications.    Recent vital signs: Patient Vitals for the past 24 hrs:  BP Temp Temp src Pulse Resp SpO2  07/25/23 0846 (!) 132/47  98.9 F (37.2 C) Oral 65 18 98 %  07/25/23 0517 (!) 139/52 97.9 F (36.6 C) Oral 68 17 100 %  07/25/23 0113 (!) 111/42 98 F (36.7 C) Oral 68 17 96 %  07/24/23 2202 (!) 131/48 99 F (37.2 C) Oral 70 17 94 %  07/24/23 1718 (!) 149/51 98.9 F (37.2 C) Oral 71 16 94 %  07/24/23 1627 (!) 157/56 98.7 F (37.1 C) Oral 72 17 --  07/24/23 1355 (!) 147/56 98 F (36.7 C) Oral 77 16 96 %  07/24/23 1200 (!) 167/60 (!) 97.5 F (36.4 C) Oral 63 17 96 %  07/24/23 1145 (!) 162/62 -- -- (!) 57 12 100 %  07/24/23 1130 (!) 154/57 -- -- 60 17 99 %  07/24/23 1115 (!) 155/60 -- -- (!) 59 11 100 %  07/24/23 1100 (!) 164/71 -- -- (!) 58 13 95 %     Recent laboratory studies:  Recent Labs    07/25/23 0357  WBC 11.4*  HGB 11.3*  HCT 34.2*  PLT 204  NA 136  K 3.8  CL 102  CO2 24  BUN 12  CREATININE 0.54  GLUCOSE 121*  CALCIUM 8.9     Discharge Medications:   Allergies as of 07/25/2023   No Known Allergies      Medication List     TAKE these medications    acetaminophen 650 MG CR tablet Commonly known as: TYLENOL Take 1,300 mg by mouth every  8 (eight) hours as needed for pain.   amoxicillin 500 MG capsule Commonly known as: AMOXIL Take 500 mg by mouth 4 (four) times daily.   aspirin EC 81 MG tablet Take 1 tablet (81 mg total) by mouth 2 (two) times daily. Swallow whole. What changed: when to take this   Biotin 5000 MCG Caps Take 5,000 mcg by mouth daily with supper.   busPIRone 7.5 MG tablet Commonly known as: BUSPAR Take 7.5 mg by mouth 2 (two) times daily.   CALCIUM + D PO Take 1 tablet by mouth every morning.   cetirizine 10 MG tablet Commonly known as: ZYRTEC Take 10 mg by mouth at bedtime.   ESTROVEN PO Take 1 tablet by mouth in the morning and at bedtime.   ezetimibe 10 MG tablet Commonly known as: ZETIA Take 10 mg by mouth daily with supper.   gabapentin 600 MG tablet Commonly known as: NEURONTIN Take 1 tablet (600 mg total) by mouth 2 (two) times  daily as needed (For burning pain).   HAIR SKIN NAILS PO Take 1 tablet by mouth in the morning, at noon, and at bedtime.   lisinopril 5 MG tablet Commonly known as: ZESTRIL Take 5 mg by mouth daily.   methocarbamol 750 MG tablet Commonly known as: ROBAXIN Take 750 mg by mouth at bedtime.   methocarbamol 500 MG tablet Commonly known as: ROBAXIN TAKE 1 TABLET(500 MG) BY MOUTH EVERY 6 HOURS AS NEEDED FOR MUSCLE SPASMS   Omega-3 1000 MG Caps Take 1,000 mg by mouth 3 (three) times daily.   oxyCODONE 5 MG immediate release tablet Commonly known as: Oxy IR/ROXICODONE Take 1-2 tablets (5-10 mg total) by mouth every 6 (six) hours as needed for moderate pain (pain score 4-6).   rosuvastatin 40 MG tablet Commonly known as: CRESTOR Take 40 mg by mouth daily with supper.   scopolamine 1 MG/3DAYS Commonly known as: TRANSDERM-SCOP Place 1 patch (1.5 mg total) onto the skin every 3 (three) days.   traZODone 50 MG tablet Commonly known as: DESYREL Take 50 mg by mouth at bedtime.   vitamin C 1000 MG tablet Take 1,000 mg by mouth every morning.   Vitamin D 50 MCG (2000 UT) tablet Take 2,000 Units by mouth daily.               Durable Medical Equipment  (From admission, onward)           Start     Ordered   07/24/23 1202  DME 3 n 1  Once        07/24/23 1201   07/24/23 1202  DME Walker rolling  Once       Question Answer Comment  Walker: With 5 Inch Wheels   Patient needs a walker to treat with the following condition Status post total replacement of right hip      07/24/23 1201            Diagnostic Studies: DG Pelvis Portable  Result Date: 07/24/2023 CLINICAL DATA:  Status post right total hip replacement. EXAM: PORTABLE PELVIS 1-2 VIEWS COMPARISON:  04/17/2022 FINDINGS: Interval placement of a right total hip arthroplasty using non cemented components. Components appear well seated. No acute fracture or dislocation. Soft tissue gas and skin clips are  consistent with recent surgery. Left hip arthroplasty is unchanged since prior study. IMPRESSION: Postoperative right total hip arthroplasty. Components appear well seated. Electronically Signed   By: Burman Nieves M.D.   On: 07/24/2023 15:08  DG HIP UNILAT WITH PELVIS 1V RIGHT  Result Date: 07/24/2023 CLINICAL DATA:  Right total hip arthroplasty. EXAM: DG HIP (WITH OR WITHOUT PELVIS) 1V RIGHT; DG C-ARM 1-60 MIN-NO REPORT Radiation exposure index: 2.0865 mGy. COMPARISON:  None Available. FINDINGS: Six intraoperative fluoroscopic images were obtained of the right hip. Right acetabular and femoral components are well situated. IMPRESSION: Fluoroscopic guidance provided during right total hip arthroplasty. Electronically Signed   By: Lupita Raider M.D.   On: 07/24/2023 10:59   DG C-Arm 1-60 Min-No Report  Result Date: 07/24/2023 Fluoroscopy was utilized by the requesting physician.  No radiographic interpretation.    Disposition: Discharge disposition: 01-Home or Self Care          Follow-up Information     Kathryne Hitch, MD Follow up in 2 week(s).   Specialty: Orthopedic Surgery Contact information: [address] [place] [address] [phone]                  Signed: Kathryne Hitch 07/25/2023, 10:52 AM

## 2023-07-25 NOTE — Plan of Care (Signed)
  Problem: Education: Goal: Knowledge of the prescribed therapeutic regimen will improve Outcome: Progressing Goal: Understanding of discharge needs will improve Outcome: Progressing Goal: Individualized Educational Video(s) Outcome: Completed/Met   Problem: Activity: Goal: Ability to avoid complications of mobility impairment will improve Outcome: Adequate for Discharge Goal: Ability to tolerate increased activity will improve Outcome: Adequate for Discharge   Problem: Clinical Measurements: Goal: Postoperative complications will be avoided or minimized Outcome: Progressing   Problem: Pain Management: Goal: Pain level will decrease with appropriate interventions Outcome: Progressing   Problem: Skin Integrity: Goal: Will show signs of wound healing Outcome: Progressing   Problem: Education: Goal: Knowledge of General Education information will improve Description: Including pain rating scale, medication(s)/side effects and non-pharmacologic comfort measures Outcome: Progressing   Problem: Health Behavior/Discharge Planning: Goal: Ability to manage health-related needs will improve Outcome: Adequate for Discharge   Problem: Clinical Measurements: Goal: Ability to maintain clinical measurements within normal limits will improve Outcome: Progressing Goal: Will remain free from infection Outcome: Progressing Goal: Diagnostic test results will improve Outcome: Progressing Goal: Respiratory complications will improve Outcome: Progressing Goal: Cardiovascular complication will be avoided Outcome: Progressing   Problem: Activity: Goal: Risk for activity intolerance will decrease Outcome: Adequate for Discharge   Problem: Nutrition: Goal: Adequate nutrition will be maintained Outcome: Completed/Met   Problem: Coping: Goal: Level of anxiety will decrease Outcome: Progressing   Problem: Elimination: Goal: Will not experience complications related to bowel  motility Outcome: Progressing Goal: Will not experience complications related to urinary retention Outcome: Progressing   Problem: Pain Managment: Goal: General experience of comfort will improve Outcome: Progressing   Problem: Safety: Goal: Ability to remain free from injury will improve Outcome: Progressing   Problem: Skin Integrity: Goal: Risk for impaired skin integrity will decrease Outcome: Progressing

## 2023-07-26 DIAGNOSIS — I10 Essential (primary) hypertension: Secondary | ICD-10-CM | POA: Diagnosis not present

## 2023-07-26 DIAGNOSIS — M5116 Intervertebral disc disorders with radiculopathy, lumbar region: Secondary | ICD-10-CM | POA: Diagnosis not present

## 2023-07-26 DIAGNOSIS — M19042 Primary osteoarthritis, left hand: Secondary | ICD-10-CM | POA: Diagnosis not present

## 2023-07-26 DIAGNOSIS — H269 Unspecified cataract: Secondary | ICD-10-CM | POA: Diagnosis not present

## 2023-07-26 DIAGNOSIS — E785 Hyperlipidemia, unspecified: Secondary | ICD-10-CM | POA: Diagnosis not present

## 2023-07-26 DIAGNOSIS — F32A Depression, unspecified: Secondary | ICD-10-CM | POA: Diagnosis not present

## 2023-07-26 DIAGNOSIS — Z96651 Presence of right artificial knee joint: Secondary | ICD-10-CM | POA: Diagnosis not present

## 2023-07-26 DIAGNOSIS — Z471 Aftercare following joint replacement surgery: Secondary | ICD-10-CM | POA: Diagnosis not present

## 2023-07-26 DIAGNOSIS — Z96641 Presence of right artificial hip joint: Secondary | ICD-10-CM | POA: Diagnosis not present

## 2023-07-26 DIAGNOSIS — M19041 Primary osteoarthritis, right hand: Secondary | ICD-10-CM | POA: Diagnosis not present

## 2023-07-26 DIAGNOSIS — Z8701 Personal history of pneumonia (recurrent): Secondary | ICD-10-CM | POA: Diagnosis not present

## 2023-07-26 DIAGNOSIS — M1712 Unilateral primary osteoarthritis, left knee: Secondary | ICD-10-CM | POA: Diagnosis not present

## 2023-07-26 DIAGNOSIS — M810 Age-related osteoporosis without current pathological fracture: Secondary | ICD-10-CM | POA: Diagnosis not present

## 2023-07-26 DIAGNOSIS — Z96642 Presence of left artificial hip joint: Secondary | ICD-10-CM | POA: Diagnosis not present

## 2023-07-26 DIAGNOSIS — Z7982 Long term (current) use of aspirin: Secondary | ICD-10-CM | POA: Diagnosis not present

## 2023-07-26 DIAGNOSIS — M4316 Spondylolisthesis, lumbar region: Secondary | ICD-10-CM | POA: Diagnosis not present

## 2023-07-26 DIAGNOSIS — Z9181 History of falling: Secondary | ICD-10-CM | POA: Diagnosis not present

## 2023-07-28 ENCOUNTER — Encounter (HOSPITAL_COMMUNITY): Payer: Self-pay | Admitting: Orthopaedic Surgery

## 2023-07-28 DIAGNOSIS — I10 Essential (primary) hypertension: Secondary | ICD-10-CM | POA: Diagnosis not present

## 2023-07-28 DIAGNOSIS — Z96651 Presence of right artificial knee joint: Secondary | ICD-10-CM | POA: Diagnosis not present

## 2023-07-28 DIAGNOSIS — H269 Unspecified cataract: Secondary | ICD-10-CM | POA: Diagnosis not present

## 2023-07-28 DIAGNOSIS — M1712 Unilateral primary osteoarthritis, left knee: Secondary | ICD-10-CM | POA: Diagnosis not present

## 2023-07-28 DIAGNOSIS — M4316 Spondylolisthesis, lumbar region: Secondary | ICD-10-CM | POA: Diagnosis not present

## 2023-07-28 DIAGNOSIS — Z8701 Personal history of pneumonia (recurrent): Secondary | ICD-10-CM | POA: Diagnosis not present

## 2023-07-28 DIAGNOSIS — F32A Depression, unspecified: Secondary | ICD-10-CM | POA: Diagnosis not present

## 2023-07-28 DIAGNOSIS — M5116 Intervertebral disc disorders with radiculopathy, lumbar region: Secondary | ICD-10-CM | POA: Diagnosis not present

## 2023-07-28 DIAGNOSIS — M19042 Primary osteoarthritis, left hand: Secondary | ICD-10-CM | POA: Diagnosis not present

## 2023-07-28 DIAGNOSIS — Z7982 Long term (current) use of aspirin: Secondary | ICD-10-CM | POA: Diagnosis not present

## 2023-07-28 DIAGNOSIS — M19041 Primary osteoarthritis, right hand: Secondary | ICD-10-CM | POA: Diagnosis not present

## 2023-07-28 DIAGNOSIS — Z96642 Presence of left artificial hip joint: Secondary | ICD-10-CM | POA: Diagnosis not present

## 2023-07-28 DIAGNOSIS — Z96641 Presence of right artificial hip joint: Secondary | ICD-10-CM | POA: Diagnosis not present

## 2023-07-28 DIAGNOSIS — E785 Hyperlipidemia, unspecified: Secondary | ICD-10-CM | POA: Diagnosis not present

## 2023-07-28 DIAGNOSIS — Z471 Aftercare following joint replacement surgery: Secondary | ICD-10-CM | POA: Diagnosis not present

## 2023-07-28 DIAGNOSIS — M810 Age-related osteoporosis without current pathological fracture: Secondary | ICD-10-CM | POA: Diagnosis not present

## 2023-07-30 DIAGNOSIS — M5116 Intervertebral disc disorders with radiculopathy, lumbar region: Secondary | ICD-10-CM | POA: Diagnosis not present

## 2023-07-30 DIAGNOSIS — M19041 Primary osteoarthritis, right hand: Secondary | ICD-10-CM | POA: Diagnosis not present

## 2023-07-30 DIAGNOSIS — M1712 Unilateral primary osteoarthritis, left knee: Secondary | ICD-10-CM | POA: Diagnosis not present

## 2023-07-30 DIAGNOSIS — Z96642 Presence of left artificial hip joint: Secondary | ICD-10-CM | POA: Diagnosis not present

## 2023-07-30 DIAGNOSIS — M810 Age-related osteoporosis without current pathological fracture: Secondary | ICD-10-CM | POA: Diagnosis not present

## 2023-07-30 DIAGNOSIS — Z96641 Presence of right artificial hip joint: Secondary | ICD-10-CM | POA: Diagnosis not present

## 2023-07-30 DIAGNOSIS — Z7982 Long term (current) use of aspirin: Secondary | ICD-10-CM | POA: Diagnosis not present

## 2023-07-30 DIAGNOSIS — E785 Hyperlipidemia, unspecified: Secondary | ICD-10-CM | POA: Diagnosis not present

## 2023-07-30 DIAGNOSIS — Z8701 Personal history of pneumonia (recurrent): Secondary | ICD-10-CM | POA: Diagnosis not present

## 2023-07-30 DIAGNOSIS — M19042 Primary osteoarthritis, left hand: Secondary | ICD-10-CM | POA: Diagnosis not present

## 2023-07-30 DIAGNOSIS — Z96651 Presence of right artificial knee joint: Secondary | ICD-10-CM | POA: Diagnosis not present

## 2023-07-30 DIAGNOSIS — Z471 Aftercare following joint replacement surgery: Secondary | ICD-10-CM | POA: Diagnosis not present

## 2023-07-30 DIAGNOSIS — I10 Essential (primary) hypertension: Secondary | ICD-10-CM | POA: Diagnosis not present

## 2023-07-30 DIAGNOSIS — H269 Unspecified cataract: Secondary | ICD-10-CM | POA: Diagnosis not present

## 2023-07-30 DIAGNOSIS — M4316 Spondylolisthesis, lumbar region: Secondary | ICD-10-CM | POA: Diagnosis not present

## 2023-07-30 DIAGNOSIS — F32A Depression, unspecified: Secondary | ICD-10-CM | POA: Diagnosis not present

## 2023-08-03 DIAGNOSIS — Z96642 Presence of left artificial hip joint: Secondary | ICD-10-CM | POA: Diagnosis not present

## 2023-08-03 DIAGNOSIS — M5116 Intervertebral disc disorders with radiculopathy, lumbar region: Secondary | ICD-10-CM | POA: Diagnosis not present

## 2023-08-03 DIAGNOSIS — I10 Essential (primary) hypertension: Secondary | ICD-10-CM | POA: Diagnosis not present

## 2023-08-03 DIAGNOSIS — M810 Age-related osteoporosis without current pathological fracture: Secondary | ICD-10-CM | POA: Diagnosis not present

## 2023-08-03 DIAGNOSIS — M4316 Spondylolisthesis, lumbar region: Secondary | ICD-10-CM | POA: Diagnosis not present

## 2023-08-03 DIAGNOSIS — Z8701 Personal history of pneumonia (recurrent): Secondary | ICD-10-CM | POA: Diagnosis not present

## 2023-08-03 DIAGNOSIS — M19041 Primary osteoarthritis, right hand: Secondary | ICD-10-CM | POA: Diagnosis not present

## 2023-08-03 DIAGNOSIS — Z96651 Presence of right artificial knee joint: Secondary | ICD-10-CM | POA: Diagnosis not present

## 2023-08-03 DIAGNOSIS — E785 Hyperlipidemia, unspecified: Secondary | ICD-10-CM | POA: Diagnosis not present

## 2023-08-03 DIAGNOSIS — F32A Depression, unspecified: Secondary | ICD-10-CM | POA: Diagnosis not present

## 2023-08-03 DIAGNOSIS — Z96641 Presence of right artificial hip joint: Secondary | ICD-10-CM | POA: Diagnosis not present

## 2023-08-03 DIAGNOSIS — Z7982 Long term (current) use of aspirin: Secondary | ICD-10-CM | POA: Diagnosis not present

## 2023-08-03 DIAGNOSIS — M1712 Unilateral primary osteoarthritis, left knee: Secondary | ICD-10-CM | POA: Diagnosis not present

## 2023-08-03 DIAGNOSIS — Z471 Aftercare following joint replacement surgery: Secondary | ICD-10-CM | POA: Diagnosis not present

## 2023-08-03 DIAGNOSIS — M19042 Primary osteoarthritis, left hand: Secondary | ICD-10-CM | POA: Diagnosis not present

## 2023-08-03 DIAGNOSIS — H269 Unspecified cataract: Secondary | ICD-10-CM | POA: Diagnosis not present

## 2023-08-06 ENCOUNTER — Encounter: Payer: Self-pay | Admitting: Orthopaedic Surgery

## 2023-08-06 ENCOUNTER — Ambulatory Visit (INDEPENDENT_AMBULATORY_CARE_PROVIDER_SITE_OTHER): Payer: Medicare Other | Admitting: Orthopaedic Surgery

## 2023-08-06 DIAGNOSIS — Z96641 Presence of right artificial hip joint: Secondary | ICD-10-CM

## 2023-08-06 NOTE — Progress Notes (Signed)
The patient is here for follow-up as a relates to her recent right hip replacement 2 weeks ago.  She is an active 77 year old female.  We replaced her left hip about 8 years ago.  She says she is doing well overall.  She has been compliant with a baby aspirin twice a day.  She was on this once a day prior to surgery so she can go back to once a day.  On exam her leg lengths appear equal.  Her right hip incision looks good and the staples been removed and Steri-Strips applied.  There is some mild swelling and seroma.  She will continue to increase her activities as she tolerates.  She does not have to perform all the exercises that the home therapy showed her and she can transition now from a walker to a cane from my standpoint.  Will see her back in 4 weeks to see how she is doing overall but no x-rays are needed.

## 2023-08-17 ENCOUNTER — Other Ambulatory Visit (HOSPITAL_COMMUNITY): Payer: Self-pay

## 2023-08-18 ENCOUNTER — Telehealth: Payer: Self-pay | Admitting: Orthopaedic Surgery

## 2023-08-18 NOTE — Telephone Encounter (Signed)
Patient called and said her upper incision is open and its red around it and it has this yellowish stuff in it. What should she do? [id number]#[phone]

## 2023-08-18 NOTE — Telephone Encounter (Signed)
Called and worked pt in to be seen in the morning by Dr. Magnus Ivan

## 2023-08-19 ENCOUNTER — Ambulatory Visit (INDEPENDENT_AMBULATORY_CARE_PROVIDER_SITE_OTHER): Payer: Medicare Other | Admitting: Orthopaedic Surgery

## 2023-08-19 ENCOUNTER — Encounter: Payer: Self-pay | Admitting: Orthopaedic Surgery

## 2023-08-19 DIAGNOSIS — Z96641 Presence of right artificial hip joint: Secondary | ICD-10-CM

## 2023-08-19 NOTE — Progress Notes (Signed)
The patient is here today at almost 4 weeks status post a right total hip arthroplasty.  She is an active 77 year old female and we replaced her left hip many years ago.  She has developed a little bit of wound breakdown at the top of her incision and she needed [place] to take a look at this today.  At the very superior aspect of her right hip incision there is some breakdown.  There is no evidence of infection.  There is tissue consistent with scarring and certainly have seen this type of wound breakdown before.  I gave reassurance that this should do well.  She should still clean it daily with antibacterial soapy water and then dry it really well and place mupirocin ointment over the wound.  She will keep her regular follow-up appointment with [place] on October 10 for repeat exam but no x-rays are needed.  If there is issues before then for this worsens they know to let us know.  She does understand that this will take some time to heal.

## 2023-08-29 DIAGNOSIS — Z23 Encounter for immunization: Secondary | ICD-10-CM | POA: Diagnosis not present

## 2023-09-03 ENCOUNTER — Ambulatory Visit: Payer: Medicare Other | Admitting: Orthopaedic Surgery

## 2023-09-03 ENCOUNTER — Encounter: Payer: Self-pay | Admitting: Orthopaedic Surgery

## 2023-09-03 DIAGNOSIS — Z96641 Presence of right artificial hip joint: Secondary | ICD-10-CM

## 2023-09-03 NOTE — Progress Notes (Signed)
The patient is an active 77 year old female that is now about 6 weeks status post a right total hip arthroplasty.  She has a remote history of a left hip replacement and a right knee replacement.  She says she is doing great and walking without assistive device.  She is not taking any medications for pain.  We have been following a small little dehiscence of her wound at the most superior aspect of it.  She is taking care of this daily with mupirocin ointment and a gauze.  The wound continues to close down and is a very small wound that is still present.  Overall though she looks great.  I gave her reassurance that this should heal with time.  She is doing continue local wound care.  I would like to see her back in 6 weeks for an assessment of her incision and I would like a standing low AP pelvis at that visit.

## 2023-09-21 ENCOUNTER — Other Ambulatory Visit: Payer: Self-pay | Admitting: Internal Medicine

## 2023-09-21 DIAGNOSIS — Z1231 Encounter for screening mammogram for malignant neoplasm of breast: Secondary | ICD-10-CM

## 2023-10-14 ENCOUNTER — Ambulatory Visit
Admission: RE | Admit: 2023-10-14 | Discharge: 2023-10-14 | Disposition: A | Discharge status: Home / Self Care | Payer: Medicare Other | Source: Ambulatory Visit | Attending: Internal Medicine | Admitting: Internal Medicine

## 2023-10-14 DIAGNOSIS — Z1231 Encounter for screening mammogram for malignant neoplasm of breast: Secondary | ICD-10-CM | POA: Diagnosis not present

## 2023-10-15 ENCOUNTER — Other Ambulatory Visit (INDEPENDENT_AMBULATORY_CARE_PROVIDER_SITE_OTHER): Payer: Medicare Other

## 2023-10-15 ENCOUNTER — Ambulatory Visit: Payer: Medicare Other | Admitting: Internal Medicine

## 2023-10-15 ENCOUNTER — Encounter: Payer: Self-pay | Admitting: Orthopaedic Surgery

## 2023-10-15 ENCOUNTER — Ambulatory Visit: Payer: Medicare Other | Admitting: Orthopaedic Surgery

## 2023-10-15 DIAGNOSIS — Z96651 Presence of right artificial knee joint: Secondary | ICD-10-CM | POA: Diagnosis not present

## 2023-10-15 DIAGNOSIS — Z96641 Presence of right artificial hip joint: Secondary | ICD-10-CM | POA: Diagnosis not present

## 2023-10-15 NOTE — Progress Notes (Signed)
The patient is a 77 year old female who is now almost 3 months status post a right total hip arthroplasty.  We have replaced her left hip remotely and her right knee remotely.  She says the right hip is doing well but she has been having some right knee pain.  I did explain to her that I think a lot of her right knee pain is related to the hip surgery itself.  I described how we actually rotate the hip and put stress on the knee and I think this is going get better.  She says her pain is not severe.  She did have some bruising around her knee after the surgery.  Examination of her right hip incision it looks great.  Her right hip moves smoothly as does her right knee.  The right knee has no bruising today and no effusion.  The knee feels ligamentously stable.  X-rays of her pelvis shows of both her hip replacements in good position.  2 views of her right knee also show a total knee arthroplasty with no complicating features.  At this point I gave her reassurance that she should continue to improve with time.  She is very active and will stay active.  We will see her back in 6 months to see how she is doing overall and just need a low AP pelvis at that visit.

## 2023-11-23 DIAGNOSIS — E785 Hyperlipidemia, unspecified: Secondary | ICD-10-CM | POA: Diagnosis not present

## 2023-11-23 DIAGNOSIS — Z1212 Encounter for screening for malignant neoplasm of rectum: Secondary | ICD-10-CM | POA: Diagnosis not present

## 2023-11-23 DIAGNOSIS — I10 Essential (primary) hypertension: Secondary | ICD-10-CM | POA: Diagnosis not present

## 2023-11-23 DIAGNOSIS — Z0000 Encounter for general adult medical examination without abnormal findings: Secondary | ICD-10-CM | POA: Diagnosis not present

## 2023-11-23 DIAGNOSIS — Z0189 Encounter for other specified special examinations: Secondary | ICD-10-CM | POA: Diagnosis not present

## 2023-12-01 DIAGNOSIS — K080 Exfoliation of teeth due to systemic causes: Secondary | ICD-10-CM | POA: Diagnosis not present

## 2023-12-02 ENCOUNTER — Other Ambulatory Visit: Payer: Self-pay | Admitting: Internal Medicine

## 2023-12-02 DIAGNOSIS — Z1339 Encounter for screening examination for other mental health and behavioral disorders: Secondary | ICD-10-CM | POA: Diagnosis not present

## 2023-12-02 DIAGNOSIS — I6523 Occlusion and stenosis of bilateral carotid arteries: Secondary | ICD-10-CM

## 2023-12-02 DIAGNOSIS — Z0000 Encounter for general adult medical examination without abnormal findings: Secondary | ICD-10-CM | POA: Diagnosis not present

## 2023-12-02 DIAGNOSIS — Z1331 Encounter for screening for depression: Secondary | ICD-10-CM | POA: Diagnosis not present

## 2023-12-02 DIAGNOSIS — I10 Essential (primary) hypertension: Secondary | ICD-10-CM | POA: Diagnosis not present

## 2023-12-07 ENCOUNTER — Ambulatory Visit
Admission: RE | Admit: 2023-12-07 | Discharge: 2023-12-07 | Disposition: A | Discharge status: Home / Self Care | Payer: Medicare Other | Source: Ambulatory Visit | Attending: Internal Medicine | Admitting: Internal Medicine

## 2023-12-07 DIAGNOSIS — Z8679 Personal history of other diseases of the circulatory system: Secondary | ICD-10-CM | POA: Diagnosis not present

## 2023-12-07 DIAGNOSIS — I6523 Occlusion and stenosis of bilateral carotid arteries: Secondary | ICD-10-CM

## 2024-03-24 DIAGNOSIS — J4521 Mild intermittent asthma with (acute) exacerbation: Secondary | ICD-10-CM | POA: Diagnosis not present

## 2024-03-24 DIAGNOSIS — R062 Wheezing: Secondary | ICD-10-CM | POA: Diagnosis not present

## 2024-04-07 NOTE — Progress Notes (Signed)
 Cardiology Office Note:    Date:  04/11/2024   ID:  Bianca Ware, DOB November 03, 1946, MRN [id number]  PCP:  Lonzie Robins, MD   [facility] Providers Cardiologist:  Bridgette Campus, MD     Referring MD: Lonzie Robins, MD   Chief Complaint  Patient presents with   Coronary Artery Disease   Hypertension    History of Present Illness:    Bianca Ware is a 78 y.o. female seen for follow up coronary artery calcification. She was seen last year by Dr Amanda Jungling. CAC score was 692. She has HLD. She was asymptomatic. She had Echo done showing normal LV function and mild to moderate AI.   Past Medical History:  Diagnosis Date   Arthritis    hands   Complication of anesthesia    waking up   Elevated coronary artery calcium  score    OA (osteoarthritis) of knee    right and left   Pneumonia    PONV (postoperative nausea and vomiting)    Seasonal allergies    Wears glasses     Past Surgical History:  Procedure Laterality Date   ABDOMINAL HYSTERECTOMY  1970s   BLADDER SUSPENSION  2002   w/ urethral sling   CATARACT EXTRACTION W/ INTRAOCULAR LENS IMPLANT Left 2018   CESAREAN SECTION  x2   yrs ago   DILATION AND CURETTAGE OF UTERUS  1960s   LUMBAR LAMINECTOMY N/A 05/29/2015   Procedure: LEFT L4-5 MICRODISCECTOMY;  Surgeon: Alphonso Jean, MD;  Location: [facility] OR;  Service: Orthopedics;  Laterality: N/A;   TONSILLECTOMY  child   TOTAL HIP ARTHROPLASTY Left 09/07/2015   Procedure: LEFT TOTAL HIP ARTHROPLASTY ANTERIOR APPROACH;  Surgeon: Arnie Lao, MD;  Location: [facility];  Service: Orthopedics;  Laterality: Left;   TOTAL HIP ARTHROPLASTY Right 07/24/2023   Procedure: RIGHT TOTAL HIP ARTHROPLASTY ANTERIOR APPROACH;  Surgeon: Arnie Lao, MD;  Location: [facility];  Service: Orthopedics;  Laterality: Right;   TOTAL KNEE ARTHROPLASTY Right 10/01/2018   Procedure: RIGHT TOTAL KNEE ARTHROPLASTY;  Surgeon: Arnie Lao, MD;  Location: [facility];   Service: Orthopedics;  Laterality: Right;   TUBAL LIGATION Bilateral yrs ago    Current Medications: Current Meds  Medication Sig   acetaminophen  (TYLENOL ) 650 MG CR tablet Take 1,300 mg by mouth every 8 (eight) hours as needed for pain.   Ascorbic Acid  (VITAMIN C ) 1000 MG tablet Take 1,000 mg by mouth every morning.   aspirin  EC 81 MG tablet Take 1 tablet (81 mg total) by mouth 2 (two) times daily. Swallow whole.   Biotin  5000 MCG CAPS Take 5,000 mcg by mouth daily with supper.   busPIRone  (BUSPAR ) 7.5 MG tablet Take 7.5 mg by mouth 2 (two) times daily.   Calcium  Carbonate-Vitamin D  (CALCIUM  + D PO) Take 1 tablet by mouth every morning.   cetirizine (ZYRTEC) 10 MG tablet Take 10 mg by mouth at bedtime.   Cholecalciferol  (VITAMIN D ) 2000 units tablet Take 2,000 Units by mouth daily.   estradiol (ESTRACE) 0.1 MG/GM vaginal cream SMARTSIG:sparingly Vaginal Twice a Week   ezetimibe  (ZETIA ) 10 MG tablet Take 10 mg by mouth daily with supper.   gabapentin  (NEURONTIN ) 600 MG tablet Take 1 tablet (600 mg total) by mouth 2 (two) times daily as needed (For burning pain).   losartan (COZAAR) 50 MG tablet 1 tablet Orally Once a day for 90 days   methocarbamol  (ROBAXIN ) 500 MG tablet TAKE 1 TABLET(500 MG) BY MOUTH  EVERY 6 HOURS AS NEEDED FOR MUSCLE SPASMS   Multiple Vitamins-Minerals (HAIR SKIN NAILS PO) Take 1 tablet by mouth in the morning, at noon, and at bedtime.   Nutritional Supplements (ESTROVEN PO) Take 1 tablet by mouth in the morning and at bedtime.   Omega-3 1000 MG CAPS Take 1,000 mg by mouth 3 (three) times daily.   oxyCODONE  (OXY IR/ROXICODONE ) 5 MG immediate release tablet Take 1-2 tablets (5-10 mg total) by mouth every 6 (six) hours as needed for moderate pain (pain score 4-6).   rosuvastatin  (CRESTOR ) 40 MG tablet Take 40 mg by mouth daily with supper.   traZODone  (DESYREL ) 50 MG tablet Take 50 mg by mouth at bedtime.     Allergies:   Patient has no known allergies.   Social  History   Socioeconomic History   Marital status: Married    Spouse name: Not on file   Number of children: Not on file   Years of education: Not on file   Highest education level: Not on file  Occupational History   Not on file  Tobacco Use   Smoking status: Former    Current packs/day: 0.00    Average packs/day: 0.3 packs/day for 15.0 years (3.8 ttl pk-yrs)    Types: Cigarettes    Start date: 05/24/1962    Quit date: 05/24/1977    Years since quitting: 46.9   Smokeless tobacco: Never  Vaping Use   Vaping status: Never Used  Substance and Sexual Activity   Alcohol  use: No   Drug use: Never   Sexual activity: Not on file  Other Topics Concern   Not on file  Social History Narrative   Not on file   Social Drivers of Health   Financial Resource Strain: Not on file  Food Insecurity: No Food Insecurity (07/24/2023)   Hunger Vital Sign    Worried About Running Out of Food in the Last Year: Never true    Ran Out of Food in the Last Year: Never true  Transportation Needs: No Transportation Needs (07/24/2023)   PRAPARE - Administrator, Civil Service (Medical): No    Lack of Transportation (Non-Medical): No  Physical Activity: Not on file  Stress: Not on file  Social Connections: Not on file     Family History: The patient's family history includes Arrhythmia in her mother; Breast cancer in her sister; Heart attack in her brother.  ROS:   Please see the history of present illness.     All other systems reviewed and are negative.  EKGs/Labs/Other Studies Reviewed:    The following studies were reviewed today:  EKG Interpretation Date/Time:  Monday Apr 11 2024 15:14:53 EDT Ventricular Rate:  80 PR Interval:  160 QRS Duration:  74 QT Interval:  372 QTC Calculation: 429 R Axis:   40  Text Interpretation: Normal sinus rhythm Cannot rule out Anterior infarct doubt When compared with ECG of 21-May-2023 09:05, No significant change was found Confirmed by [place],  Harlean Regula [phone]) on 04/11/2024 3:23:21 PM   03/24/2023 FINDINGS: CORONARY CALCIUM  SCORES:  Left Main: 0 LAD: 352 LCx: 201 RCA: 139  Total Agatston Score: 692 MESA database percentile: 89th AORTA MEASUREMENTS: Ascending Aorta: 3.1 cm Descending Aorta:2.6 cm EKG Interpretation Date/Time:  Monday Apr 11 2024 15:14:53 EDT Ventricular Rate:  80 PR Interval:  160 QRS Duration:  74 QT Interval:  372 QTC Calculation: 429 R Axis:   40  Text Interpretation: Normal sinus rhythm Cannot rule out Anterior infarct doubt  When compared with ECG of 21-May-2023 09:05, No significant change was found Confirmed by [place], Ziva Nunziata [phone]) on 04/11/2024 3:23:21 PM   Echo 06/17/23: IMPRESSIONS     1. Left ventricular ejection fraction, by estimation, is 55 to 60%. Left  ventricular ejection fraction by 3D volume is 55 %. The left ventricle has  normal function. The left ventricle has no regional wall motion  abnormalities. Left ventricular diastolic   parameters were normal. The average left ventricular global longitudinal  strain is -20.6 %. The global longitudinal strain is normal.   2. Right ventricular systolic function is normal. The right ventricular  size is normal. There is normal pulmonary artery systolic pressure. The  estimated right ventricular systolic pressure is 35.7 mmHg.   3. The mitral valve is grossly normal. Trivial mitral valve  regurgitation. No evidence of mitral stenosis.   4. The aortic valve is tricuspid. There is mild calcification of the  aortic valve. Aortic valve regurgitation is mild to moderate. Aortic valve  sclerosis is present, with no evidence of aortic valve stenosis.   5. The inferior vena cava is normal in size with greater than 50%  respiratory variability, suggesting right atrial pressure of 3 mmHg.   Recent Labs: 07/16/2023: ALT 43 07/25/2023: BUN 12; Creatinine, Ser 0.54; Hemoglobin 11.3; Platelets 204; Potassium 3.8; Sodium 136  Recent Lipid Panel No results  found for: "CHOL", "TRIG", "HDL", "CHOLHDL", "VLDL", "LDLCALC", "LDLDIRECT"   Risk Assessment/Calculations:                Physical Exam:    VS:  BP 134/68 (BP Location: Right Arm, Patient Position: Sitting, Cuff Size: Normal)   Pulse 86   Ht 5\' 7"  (1.702 m)   Wt 176 lb 9.6 oz (80.1 kg)   SpO2 92%   BMI 27.66 kg/m     Wt Readings from Last 3 Encounters:  04/11/24 176 lb 9.6 oz (80.1 kg)  07/24/23 223 lb (101.2 kg)  07/16/23 223 lb (101.2 kg)     GEN:  Well nourished, well developed in no acute distress HEENT: Normal NECK: No JVD; No carotid bruits LYMPHATICS: No lymphadenopathy CARDIAC: RRR, no murmurs, rubs, gallops RESPIRATORY:  Clear to auscultation without rales, wheezing or rhonchi  ABDOMEN: Soft, non-tender, non-distended MUSCULOSKELETAL:  No edema; No deformity  SKIN: Warm and dry NEUROLOGIC:  Alert and oriented x 3 PSYCHIATRIC:  Normal affect   ASSESSMENT:    1. Calcification of artery   2. Primary hypertension   3. Hypercholesterolemia    PLAN:    In order of problems listed above:  CAD with coronary calcification. No symptoms of angina. Normal LV function. Recommend optimal risk factor management HLD goal LDL < 55. Will repeat lipid panel. On maximal Crestor  dose and Zetia . If still not at goal consider Repatha or Leqvio HTN excellent control.            Medication Adjustments/Labs and Tests Ordered: Current medicines are reviewed at length with the patient today.  Concerns regarding medicines are outlined above.  Orders Placed This Encounter  Procedures   Lipid panel   EKG 12-Lead   No orders of the defined types were placed in this encounter.   Patient Instructions  Medication Instructions:  Continue same medications *If you need a refill on your cardiac medications before your next appointment, please call your pharmacy*  Lab Work: Lipid panel  Testing/Procedures: None ordered  Follow-Up: At [facility], you and  your health needs are our priority.  As part of our continuing mission to provide you with exceptional heart care, our providers are all part of one team.  This team includes your primary Cardiologist (physician) and Advanced Practice Providers or APPs (Physician Assistants and Nurse Practitioners) who all work together to provide you with the care you need, when you need it.  Your next appointment:  1 year   Call in Feb to schedule May appointment     Provider:  Dr.Vanassa Penniman    We recommend signing up for the patient portal called "MyChart".  Sign up information is provided on this After Visit Summary.  MyChart is used to connect with patients for Virtual Visits (Telemedicine).  Patients are able to view lab/test results, encounter notes, upcoming appointments, etc.  Non-urgent messages can be sent to your provider as well.   To learn more about what you can do with MyChart, go to [email or web address].          Signed, Djuan Talton [place], MD  04/11/2024 5:14 PM    [facility]

## 2024-04-11 ENCOUNTER — Encounter: Payer: Self-pay | Admitting: Cardiology

## 2024-04-11 ENCOUNTER — Ambulatory Visit: Payer: Medicare Other | Attending: Cardiology | Admitting: Cardiology

## 2024-04-11 VITALS — BP 134/68 | HR 86 | Ht 67.0 in | Wt 176.6 lb

## 2024-04-11 DIAGNOSIS — I7090 Unspecified atherosclerosis: Secondary | ICD-10-CM | POA: Diagnosis not present

## 2024-04-11 DIAGNOSIS — I10 Essential (primary) hypertension: Secondary | ICD-10-CM | POA: Diagnosis not present

## 2024-04-11 DIAGNOSIS — E7800 Pure hypercholesterolemia, unspecified: Secondary | ICD-10-CM | POA: Diagnosis not present

## 2024-04-11 NOTE — Patient Instructions (Signed)
 Medication Instructions:  Continue same medications *If you need a refill on your cardiac medications before your next appointment, please call your pharmacy*  Lab Work: Lipid panel  Testing/Procedures: None ordered  Follow-Up: At [facility], you and your health needs are our priority.  As part of our continuing mission to provide you with exceptional heart care, our providers are all part of one team.  This team includes your primary Cardiologist (physician) and Advanced Practice Providers or APPs (Physician Assistants and Nurse Practitioners) who all work together to provide you with the care you need, when you need it.  Your next appointment:  1 year   Call in Feb to schedule May appointment     Provider:  Dr.Jordan    We recommend signing up for the patient portal called "MyChart".  Sign up information is provided on this After Visit Summary.  MyChart is used to connect with patients for Virtual Visits (Telemedicine).  Patients are able to view lab/test results, encounter notes, upcoming appointments, etc.  Non-urgent messages can be sent to your provider as well.   To learn more about what you can do with MyChart, go to [email or web address].

## 2024-04-13 ENCOUNTER — Ambulatory Visit: Payer: Medicare Other | Admitting: Orthopaedic Surgery

## 2024-04-15 DIAGNOSIS — E7800 Pure hypercholesterolemia, unspecified: Secondary | ICD-10-CM | POA: Diagnosis not present

## 2024-04-15 DIAGNOSIS — I7090 Unspecified atherosclerosis: Secondary | ICD-10-CM | POA: Diagnosis not present

## 2024-04-15 DIAGNOSIS — I10 Essential (primary) hypertension: Secondary | ICD-10-CM | POA: Diagnosis not present

## 2024-04-15 LAB — LIPID PANEL
Chol/HDL Ratio: 3 ratio (ref 0.0–4.4)
Cholesterol, Total: 140 mg/dL (ref 100–199)
HDL: 47 mg/dL (ref >39)
LDL Chol Calc (NIH): 73 mg/dL (ref 0–99)
Triglycerides: 109 mg/dL (ref 0–149)
VLDL Cholesterol Cal: 20 mg/dL (ref 5–40)

## 2024-04-18 ENCOUNTER — Ambulatory Visit: Payer: Self-pay | Admitting: Cardiology

## 2024-04-21 ENCOUNTER — Telehealth: Payer: Self-pay | Admitting: Cardiology

## 2024-04-21 ENCOUNTER — Other Ambulatory Visit: Payer: Self-pay

## 2024-04-21 DIAGNOSIS — E7800 Pure hypercholesterolemia, unspecified: Secondary | ICD-10-CM

## 2024-04-21 NOTE — Telephone Encounter (Signed)
 Pt returning nurse call in regards to results. Please advise

## 2024-04-21 NOTE — Telephone Encounter (Signed)
 Spoke to patient lab results given.Scheduler will call back with Pharm D lipid clinic appointment.

## 2024-04-26 ENCOUNTER — Ambulatory Visit: Attending: Cardiovascular Disease | Admitting: Pharmacist

## 2024-04-26 ENCOUNTER — Encounter: Payer: Self-pay | Admitting: Pharmacist

## 2024-04-26 ENCOUNTER — Other Ambulatory Visit (HOSPITAL_COMMUNITY): Payer: Self-pay

## 2024-04-26 ENCOUNTER — Telehealth: Payer: Self-pay | Admitting: Pharmacist

## 2024-04-26 ENCOUNTER — Telehealth: Payer: Self-pay | Admitting: Pharmacy Technician

## 2024-04-26 DIAGNOSIS — E7800 Pure hypercholesterolemia, unspecified: Secondary | ICD-10-CM | POA: Diagnosis not present

## 2024-04-26 DIAGNOSIS — I2510 Atherosclerotic heart disease of native coronary artery without angina pectoris: Secondary | ICD-10-CM | POA: Insufficient documentation

## 2024-04-26 DIAGNOSIS — I7090 Unspecified atherosclerosis: Secondary | ICD-10-CM | POA: Diagnosis not present

## 2024-04-26 DIAGNOSIS — I10 Essential (primary) hypertension: Secondary | ICD-10-CM | POA: Insufficient documentation

## 2024-04-26 DIAGNOSIS — G4709 Other insomnia: Secondary | ICD-10-CM | POA: Insufficient documentation

## 2024-04-26 DIAGNOSIS — I6523 Occlusion and stenosis of bilateral carotid arteries: Secondary | ICD-10-CM | POA: Insufficient documentation

## 2024-04-26 NOTE — Telephone Encounter (Signed)
 Please complete PA for Repatha/Praluent

## 2024-04-26 NOTE — Progress Notes (Signed)
 Patient ID: Bianca Ware                 DOB: [birth year]                    MRN: [id number]     HPI: Bianca Ware is a 78 y.o. female patient referred to lipid clinic by Dr [place]. PMH is significant for carotid artery stenosis, elevated coronary calcium , HTN, CAD, and HLD.  Patient is currently managed on rosuvastatin  40mg  daily and ezetimibe  10mg  daily. Reports no adverse effects.  Patient presents today with husband. Eats a heart healthy diet, does not go out to eat. Diet consists of vegetables and lean proteins. Non smoker.  Has a family history of CAD on her mother's side. Mother and both brothers had MI.   Current Medications:  Rosuvastatin  40mg  daily Ezetimibe  10mg  daily  Intolerances: N/A  Risk Factors:  CAD Coronary Calcium  Family history  LDL goal: <55  Labs: TC 140, Trigs 109, HDL 47, LDL 73 (04/15/24)  Past Medical History:  Diagnosis Date   Arthritis    hands   Complication of anesthesia    waking up   Elevated coronary artery calcium  score    OA (osteoarthritis) of knee    right and left   Pneumonia    PONV (postoperative nausea and vomiting)    Seasonal allergies    Wears glasses     Current Outpatient Medications on File Prior to Visit  Medication Sig Dispense Refill   albuterol (VENTOLIN HFA) 108 (90 Base) MCG/ACT inhaler Inhale 2 puffs into the lungs every 4 (four) hours as needed.     PREMARIN vaginal cream Place 1 applicator vaginally daily.     acetaminophen  (TYLENOL ) 650 MG CR tablet Take 1,300 mg by mouth every 8 (eight) hours as needed for pain.     Ascorbic Acid  (VITAMIN C ) 1000 MG tablet Take 1,000 mg by mouth every morning.     aspirin  EC 81 MG tablet Take 1 tablet (81 mg total) by mouth 2 (two) times daily. Swallow whole. 30 tablet 12   Biotin  5000 MCG CAPS Take 5,000 mcg by mouth daily with supper.     busPIRone  (BUSPAR ) 7.5 MG tablet Take 7.5 mg by mouth 2 (two) times daily.     Calcium  Carbonate-Vitamin D  (CALCIUM  + D PO) Take 1  tablet by mouth every morning.     cetirizine (ZYRTEC) 10 MG tablet Take 10 mg by mouth at bedtime.     Cholecalciferol  (VITAMIN D ) 2000 units tablet Take 2,000 Units by mouth daily.     estradiol (ESTRACE) 0.1 MG/GM vaginal cream SMARTSIG:sparingly Vaginal Twice a Week     ezetimibe  (ZETIA ) 10 MG tablet Take 10 mg by mouth daily with supper.     gabapentin  (NEURONTIN ) 600 MG tablet Take 1 tablet (600 mg total) by mouth 2 (two) times daily as needed (For burning pain). 60 tablet 0   losartan (COZAAR) 50 MG tablet 1 tablet Orally Once a day for 90 days     methocarbamol  (ROBAXIN ) 500 MG tablet TAKE 1 TABLET(500 MG) BY MOUTH EVERY 6 HOURS AS NEEDED FOR MUSCLE SPASMS 40 tablet 0   methocarbamol  (ROBAXIN ) 750 MG tablet Take 750 mg by mouth at bedtime.     Multiple Vitamins-Minerals (HAIR SKIN NAILS PO) Take 1 tablet by mouth in the morning, at noon, and at bedtime.     Nutritional Supplements (ESTROVEN PO) Take 1 tablet by mouth in the morning and  at bedtime.     Omega-3 1000 MG CAPS Take 1,000 mg by mouth 3 (three) times daily.     oxyCODONE  (OXY IR/ROXICODONE ) 5 MG immediate release tablet Take 1-2 tablets (5-10 mg total) by mouth every 6 (six) hours as needed for moderate pain (pain score 4-6). 30 tablet 0   rosuvastatin  (CRESTOR ) 40 MG tablet Take 40 mg by mouth daily with supper.     traZODone  (DESYREL ) 50 MG tablet Take 50 mg by mouth at bedtime.     No current facility-administered medications on file prior to visit.    No Known Allergies  Assessment/Plan:  1. Hyperlipidemia - Patient's last LDL of 73 is above goal of <55 despite high intensity statin plus ezetimibe . Due to coronary calcium  and CAD, recommend addition of PCSK9i.  Using demo pen, educated patient on mechanism of action, storage, site selection, administration, and possible adverse effects. Patient was able to demonstrate use in room.  Will complete PA and contact patient with result. Recheck lipid panel in 3  months.  Continue rosuvastatin  40mg  daily Continue ezetimibe  10mg  daily Start PCSK9i q 2 weeks Recheck lipid panel in 3 months  Chris Juanluis Guastella, PharmD, BCACP, CDCES, CPP [facility] [address] [place], [place] [address] Phone: [phone]; Fax: [phone] 04/26/2024 4:06 PM

## 2024-04-26 NOTE — Patient Instructions (Addendum)
 It was nice meeting you two today  We would like your LDL (bad cholesterol) to be less than 55  Please continue your rosuvastatin  40mg  once a day and your ezetimibe  10mg  once a day for now  The medication we discussed is called Repatha which is an injection you would take once every 2 weeks  I will complete the prior authorization for you and contact you when it is approved  Once you start the medication we would recheck your fasting lipid panel in about 3 months  Please let us  know if you have any questions  Joelene Murrain, PharmD, BCACP, CDCES, CPP [facility] [address] [place], [place] [address] Phone: [phone]; Fax: [phone] 04/26/2024 3:45 PM

## 2024-04-26 NOTE — Telephone Encounter (Signed)
 Pharmacy Patient Advocate Encounter   Received notification from Pt Calls Messages that prior authorization for Repatha is required/requested.   Insurance verification completed.   The patient is insured through [facility] .   Per test claim: PA required; PA submitted to above mentioned insurance via CoverMyMeds Key/confirmation #/EOC [id number] Status is pending

## 2024-04-27 ENCOUNTER — Other Ambulatory Visit (HOSPITAL_COMMUNITY): Payer: Self-pay

## 2024-04-27 NOTE — Telephone Encounter (Signed)
 Pharmacy Patient Advocate Encounter  Received notification from [facility] that Prior Authorization for Repatha has been APPROVED from 04/26/24 to 04/26/25. Ran test claim, Copay is $76.45- one month. This test claim was processed through [facility]- copay amounts may vary at other pharmacies due to pharmacy/plan contracts, or as the patient moves through the different stages of their insurance plan.   PA #/Case ID/Reference #: [id number]

## 2024-04-27 NOTE — Telephone Encounter (Signed)
 Contacted patient to let her know Repatha was approved. Patient will contact her pharmacy for price and call back

## 2024-04-28 ENCOUNTER — Other Ambulatory Visit (HOSPITAL_COMMUNITY): Payer: Self-pay

## 2024-04-28 ENCOUNTER — Telehealth: Payer: Self-pay | Admitting: Pharmacy Technician

## 2024-04-28 DIAGNOSIS — I7090 Unspecified atherosclerosis: Secondary | ICD-10-CM

## 2024-04-28 DIAGNOSIS — E7800 Pure hypercholesterolemia, unspecified: Secondary | ICD-10-CM

## 2024-04-28 DIAGNOSIS — R931 Abnormal findings on diagnostic imaging of heart and coronary circulation: Secondary | ICD-10-CM

## 2024-04-28 MED ORDER — REPATHA SURECLICK 140 MG/ML ~~LOC~~ SOAJ
1.0000 mL | SUBCUTANEOUS | 3 refills | Status: AC
Start: 2024-04-28 — End: ?
  Filled 2024-04-28: qty 6, 84d supply, fill #0
  Filled 2024-07-12: qty 6, 84d supply, fill #1
  Filled 2024-10-05: qty 6, 84d supply, fill #2

## 2024-04-28 NOTE — Telephone Encounter (Signed)
 Patient Advocate Encounter   The patient was approved for a [employer] grant that will help cover the cost of Repatha Total amount awarded, 2500.00.  Effective: 03/29/24 - 03/28/25   UJW:[id number] WGN:[id number] MVHQI:[id number] [id number] [employer] ID: [id number]   Pharmacy provided with approval and processing information. Patient informed via mychart

## 2024-04-28 NOTE — Addendum Note (Signed)
 Addended by: Sunny English on: 04/28/2024 02:27 PM   Modules accepted: Orders

## 2024-05-02 ENCOUNTER — Other Ambulatory Visit (HOSPITAL_COMMUNITY): Payer: Self-pay

## 2024-05-09 DIAGNOSIS — Z961 Presence of intraocular lens: Secondary | ICD-10-CM | POA: Diagnosis not present

## 2024-05-11 ENCOUNTER — Other Ambulatory Visit (INDEPENDENT_AMBULATORY_CARE_PROVIDER_SITE_OTHER): Payer: Self-pay

## 2024-05-11 ENCOUNTER — Encounter: Payer: Self-pay | Admitting: Orthopaedic Surgery

## 2024-05-11 ENCOUNTER — Ambulatory Visit: Admitting: Orthopaedic Surgery

## 2024-05-11 DIAGNOSIS — M25511 Pain in right shoulder: Secondary | ICD-10-CM | POA: Diagnosis not present

## 2024-05-11 DIAGNOSIS — Z96641 Presence of right artificial hip joint: Secondary | ICD-10-CM

## 2024-05-11 DIAGNOSIS — G8929 Other chronic pain: Secondary | ICD-10-CM | POA: Diagnosis not present

## 2024-05-11 NOTE — Progress Notes (Signed)
 The patient is well-known to me.  She is 78 years old and we more recently replaced her right hip in August of last year.  We replaced her hip left hip several years before that.  She has also had knee replacements.  She said the hips are doing great.  However she has been dealing with 3 years of right shoulder pain with activities with increased pain over the last 2 days with no known injury.  She says it is really a deep joint pain that she feels.  There is no numbness and tingling she does have shoulder weakness she states.  She does take Tylenol  for pain.  Examination of her right shoulder shows actually decent range of motion of the shoulder but there is some weakness of the rotator cuff itself.  I do feel some grinding of the glenohumeral joint.  Both her hips are moving smoothly and fluidly.  X-rays of her right shoulder show significant end-stage glenohumeral arthritis.  The humeral head is not high riding.  An AP pelvis shows bilateral total hip arthroplasties with no complicating features.  I would like to send her to my partner Dr. Rozelle Corning for her right shoulder.  I showed her x-rays and described the arthritis she is dealing with with the right shoulder.  She would like to see him in follow-up for the shoulder.  She is incredibly active and is done very well with other joint replacement surgeries.

## 2024-06-07 DIAGNOSIS — I10 Essential (primary) hypertension: Secondary | ICD-10-CM | POA: Diagnosis not present

## 2024-06-08 ENCOUNTER — Ambulatory Visit (INDEPENDENT_AMBULATORY_CARE_PROVIDER_SITE_OTHER): Admitting: Orthopedic Surgery

## 2024-06-08 ENCOUNTER — Other Ambulatory Visit: Payer: Self-pay

## 2024-06-08 ENCOUNTER — Encounter: Payer: Self-pay | Admitting: Orthopedic Surgery

## 2024-06-08 DIAGNOSIS — G8929 Other chronic pain: Secondary | ICD-10-CM

## 2024-06-08 DIAGNOSIS — M19011 Primary osteoarthritis, right shoulder: Secondary | ICD-10-CM | POA: Diagnosis not present

## 2024-06-08 DIAGNOSIS — M25511 Pain in right shoulder: Secondary | ICD-10-CM | POA: Diagnosis not present

## 2024-06-08 NOTE — Progress Notes (Signed)
 Office Visit Note   Patient: Bianca Ware           Date of Birth: 04-Jun-1946           MRN: [id number] Visit Date: 06/08/2024 Requested by: Janey Santos, MD [address] [place],  [place] [address] PCP: Janey Santos, MD  Subjective: Chief Complaint  Patient presents with   Right Shoulder - Pain    HPI: Bianca Ware is a 78 y.o. female who presents to the office reporting right shoulder pain for 3 years.  Pain is getting worse.  Describes painful range of motion.  Sometimes pain radiates down to her hand and into her anterior chest wall.  She is right-hand dominant.  Tylenol  arthritis takes the edge off.  Heat helps.  Cold makes it worse.  Hard for her to hold before when she walks on the treadmill.  Hard for her to drive now as well.  Left shoulder okay..                ROS: All systems reviewed are negative as they relate to the chief complaint within the history of present illness.  Patient denies fevers or chills.  Assessment & Plan: Visit Diagnoses:  1. Chronic right shoulder pain     Plan: Impression is end-stage right shoulder arthritis.  Radiographs show bone-on-bone changes along with osteophyte formation.  Range of motion is significantly limited on the right-hand side.  Plan at this time is ultrasound-guided injection into the glenohumeral joint and 6-week return with decision for against replacement at that time.  I think she is likely heading for shoulder replacement.  Symptoms are worsening.  This will give her some pain relief but we will see her back in 6 weeks for further discussion.  Follow-Up Instructions: No follow-ups on file.   Orders:  Orders Placed This Encounter  Procedures   US  Guided Needle Placement - No Linked Charges   No orders of the defined types were placed in this encounter.     Procedures: Large Joint Inj: R glenohumeral on 06/08/2024 11:22 AM Indications: diagnostic evaluation and pain Details: 22 G 3.5 in needle,  ultrasound-guided posterior approach  Arthrogram: No  Medications: 9 mL bupivacaine  0.5 %; 5 mL lidocaine  1 %; 40 mg triamcinolone  acetonide 40 MG/ML Outcome: tolerated well, no immediate complications Procedure, treatment alternatives, risks and benefits explained, specific risks discussed. Consent was given by the patient. Immediately prior to procedure a time out was called to verify the correct patient, procedure, equipment, support staff and site/side marked as required. Patient was prepped and draped in the usual sterile fashion.       Clinical Data: No additional findings.  Objective: Vital Signs: There were no vitals taken for this visit.  Physical Exam:  Constitutional: Patient appears well-developed HEENT:  Head: Normocephalic Eyes:EOM are normal Neck: Normal range of motion Cardiovascular: Normal rate Pulmonary/chest: Effort normal Neurologic: Patient is alert Skin: Skin is warm Psychiatric: Patient has normal mood and affect  Ortho Exam: Ortho exam demonstrates range of motion of the right of 10/60/85.  Rotator cuff strength infraspinatus and supraspinatus testing is symmetric with the left-hand side and intact.  Deltoid fires.  Motor and sensory function in the hands intact.  Cervical range of motion is full.  Specialty Comments:  No specialty comments available.  Imaging: No results found.   PMFS History: Patient Active Problem List   Diagnosis Date Noted   Bilateral carotid artery stenosis 04/26/2024   Atherosclerotic heart disease  of native coronary artery without angina pectoris 04/26/2024   Hypertension 04/26/2024   Other insomnia 04/26/2024   Status post total replacement of right hip 07/24/2023   Unilateral primary osteoarthritis, right hip 06/05/2022   Obesity 09/23/2019   Status post total knee replacement, right 10/01/2018   Chronic pain of left knee 08/11/2018   Unilateral primary osteoarthritis, left knee 08/11/2018   Chronic pain of right  knee 02/22/2018   Status post total replacement of left hip 09/07/2015   Lumbar disc herniation with radiculopathy 05/29/2015    Class: Acute   Spondylolisthesis of lumbar region 05/29/2015    Class: Chronic   Hyperlipidemia 06/17/2013   Irritable bowel syndrome 06/17/2013   Osteoarthritis 06/17/2013   Past Medical History:  Diagnosis Date   Arthritis    hands   Complication of anesthesia    waking up   Elevated coronary artery calcium  score    OA (osteoarthritis) of knee    right and left   Pneumonia    PONV (postoperative nausea and vomiting)    Seasonal allergies    Wears glasses     Family History  Problem Relation Age of Onset   Arrhythmia Mother        pacemaker   Breast cancer Sister    Heart attack Brother     Past Surgical History:  Procedure Laterality Date   ABDOMINAL HYSTERECTOMY  1970s   BLADDER SUSPENSION  2002   w/ urethral sling   CATARACT EXTRACTION W/ INTRAOCULAR LENS IMPLANT Left 2018   CESAREAN SECTION  x2   yrs ago   DILATION AND CURETTAGE OF UTERUS  1960s   LUMBAR LAMINECTOMY N/A 05/29/2015   Procedure: LEFT L4-5 MICRODISCECTOMY;  Surgeon: Lynwood FORBES Better, MD;  Location: [facility];  Service: Orthopedics;  Laterality: N/A;   TONSILLECTOMY  child   TOTAL HIP ARTHROPLASTY Left 09/07/2015   Procedure: LEFT TOTAL HIP ARTHROPLASTY ANTERIOR APPROACH;  Surgeon: Lonni CINDERELLA Poli, MD;  Location: [facility];  Service: Orthopedics;  Laterality: Left;   TOTAL HIP ARTHROPLASTY Right 07/24/2023   Procedure: RIGHT TOTAL HIP ARTHROPLASTY ANTERIOR APPROACH;  Surgeon: Poli Lonni CINDERELLA, MD;  Location: [facility];  Service: Orthopedics;  Laterality: Right;   TOTAL KNEE ARTHROPLASTY Right 10/01/2018   Procedure: RIGHT TOTAL KNEE ARTHROPLASTY;  Surgeon: Poli Lonni CINDERELLA, MD;  Location: [facility];  Service: Orthopedics;  Laterality: Right;   TUBAL LIGATION Bilateral yrs ago   Social History   Occupational History   Not on file  Tobacco Use   Smoking status: Former     Current packs/day: 0.00    Average packs/day: 0.3 packs/day for 15.0 years (3.8 ttl pk-yrs)    Types: Cigarettes    Start date: 05/24/1962    Quit date: 05/24/1977    Years since quitting: 47.0   Smokeless tobacco: Never  Vaping Use   Vaping status: Never Used  Substance and Sexual Activity   Alcohol  use: No   Drug use: Never   Sexual activity: Not on file

## 2024-06-11 MED ORDER — BUPIVACAINE HCL 0.5 % IJ SOLN
9.0000 mL | INTRAMUSCULAR | Status: AC | PRN
Start: 2024-06-08 — End: 2024-06-08
  Administered 2024-06-08: 9 mL via INTRA_ARTICULAR

## 2024-06-11 MED ORDER — TRIAMCINOLONE ACETONIDE 40 MG/ML IJ SUSP
40.0000 mg | INTRAMUSCULAR | Status: AC | PRN
  Administered 2024-06-08: 40 mg via INTRA_ARTICULAR

## 2024-06-11 MED ORDER — LIDOCAINE HCL 1 % IJ SOLN
5.0000 mL | INTRAMUSCULAR | Status: AC | PRN
  Administered 2024-06-08: 5 mL

## 2024-07-12 ENCOUNTER — Other Ambulatory Visit (HOSPITAL_COMMUNITY): Payer: Self-pay

## 2024-07-20 ENCOUNTER — Ambulatory Visit (INDEPENDENT_AMBULATORY_CARE_PROVIDER_SITE_OTHER): Admitting: Orthopedic Surgery

## 2024-07-20 ENCOUNTER — Encounter: Payer: Self-pay | Admitting: Orthopedic Surgery

## 2024-07-20 DIAGNOSIS — M25511 Pain in right shoulder: Secondary | ICD-10-CM | POA: Diagnosis not present

## 2024-07-20 DIAGNOSIS — G8929 Other chronic pain: Secondary | ICD-10-CM | POA: Diagnosis not present

## 2024-07-20 DIAGNOSIS — M19011 Primary osteoarthritis, right shoulder: Secondary | ICD-10-CM | POA: Diagnosis not present

## 2024-07-21 ENCOUNTER — Encounter: Payer: Self-pay | Admitting: Orthopedic Surgery

## 2024-07-21 MED ORDER — METHOCARBAMOL 500 MG PO TABS
500.0000 mg | ORAL_TABLET | Freq: Three times a day (TID) | ORAL | 0 refills | Status: AC | PRN
Start: 2024-07-21 — End: ?

## 2024-07-21 MED ORDER — OXYCODONE HCL 5 MG PO TABS: 5.0000 mg | ORAL_TABLET | Freq: Four times a day (QID) | ORAL | 0 refills | Status: DC | PRN

## 2024-07-21 MED ORDER — GABAPENTIN 100 MG PO CAPS: 100.0000 mg | ORAL_CAPSULE | Freq: Two times a day (BID) | ORAL | 0 refills | Status: AC

## 2024-07-21 NOTE — Progress Notes (Signed)
 Office Visit Note   Patient: Bianca Ware           Date of Birth: 09-10-1946           MRN: [id number] Visit Date: 07/20/2024 Requested by: Bianca Santos, MD [address] [place],  [place] [address] PCP: Bianca Santos, MD  Subjective: Chief Complaint  Patient presents with   Right Shoulder - Pain    HPI: Bianca Ware is a 78 y.o. female who presents to the office reporting continued right shoulder pain.  Injection done 06/08/2024 which helped some but the pain has come back.  She is right-hand dominant.  She does report some decreased grip strength as well as recurrent shoulder pain.  Hard for her to drive and hard for her to dry herself.  She really cannot push the sharpening cart.  Hard for her to change the sheets on her bed.  The pain does wake her from sleep at night on a regular basis.  She does have end-stage arthritis in the shoulder joint..                ROS: All systems reviewed are negative as they relate to the chief complaint within the history of present illness.  Patient denies fevers or chills.  Assessment & Plan: Visit Diagnoses:  1. Chronic right shoulder pain   2. Arthritis of right shoulder     Plan: Impression is end-stage right shoulder arthritis with failure of conservative treatment.  Injection in mid July puts her potential surgery date off until mid October.  The risk and benefits of reverse shoulder replacement are discussed with the patient including not limited to infection nerve vessel damage incomplete pain relief as well as incomplete functional restoration.  SPECT and rehab is also discussed.  Overall based on the amount of disability and pain Bianca Ware is having I think it would be a good operation for her.  Think that CT scan pending for patient specific instrumentation to optimize implant position and implant longevity.  I will send in her postoperative medication regimen at this time so she will have them before her surgery.  All questions  answered.  Follow-Up Instructions: No follow-ups on file.   Orders:  Orders Placed This Encounter  Procedures   CT SHOULDER RIGHT WO CONTRAST   No orders of the defined types were placed in this encounter.     Procedures: No procedures performed   Clinical Data: No additional findings.  Objective: Vital Signs: There were no vitals taken for this visit.  Physical Exam:  Constitutional: Patient appears well-developed HEENT:  Head: Normocephalic Eyes:EOM are normal Neck: Normal range of motion Cardiovascular: Normal rate Pulmonary/chest: Effort normal Neurologic: Patient is alert Skin: Skin is warm Psychiatric: Patient has normal mood and affect  Ortho Exam: Ortho exam demonstrates functional deltoid.  Cervical spine range of motion intact.  Patient has good wrist flexion extension biceps triceps and deltoid strength.  A lot of pain with passive range of motion of that right shoulder with expected amount of coarseness and grinding present.  Range of motion is essentially the same at 10/60/85 with pretty reasonable rotator cuff strength to internal and external rotation strength testing at 15 degrees of abduction.  Range of motion beyond her passive range of motion is painful.  Deltoid fires nicely.  Specialty Comments:  No specialty comments available.  Imaging: No results found.   PMFS History: Patient Active Problem List   Diagnosis Date Noted   Bilateral carotid artery  stenosis 04/26/2024   Atherosclerotic heart disease of native coronary artery without angina pectoris 04/26/2024   Hypertension 04/26/2024   Other insomnia 04/26/2024   Status post total replacement of right hip 07/24/2023   Unilateral primary osteoarthritis, right hip 06/05/2022   Obesity 09/23/2019   Status post total knee replacement, right 10/01/2018   Chronic pain of left knee 08/11/2018   Unilateral primary osteoarthritis, left knee 08/11/2018   Chronic pain of right knee 02/22/2018    Status post total replacement of left hip 09/07/2015   Lumbar disc herniation with radiculopathy 05/29/2015    Class: Acute   Spondylolisthesis of lumbar region 05/29/2015    Class: Chronic   Hyperlipidemia 06/17/2013   Irritable bowel syndrome 06/17/2013   Osteoarthritis 06/17/2013   Past Medical History:  Diagnosis Date   Arthritis    hands   Complication of anesthesia    waking up   Elevated coronary artery calcium  score    OA (osteoarthritis) of knee    right and left   Pneumonia    PONV (postoperative nausea and vomiting)    Seasonal allergies    Wears glasses     Family History  Problem Relation Age of Onset   Arrhythmia Mother        pacemaker   Breast cancer Sister    Heart attack Brother     Past Surgical History:  Procedure Laterality Date   ABDOMINAL HYSTERECTOMY  1970s   BLADDER SUSPENSION  2002   w/ urethral sling   CATARACT EXTRACTION W/ INTRAOCULAR LENS IMPLANT Left 2018   CESAREAN SECTION  x2   yrs ago   DILATION AND CURETTAGE OF UTERUS  1960s   LUMBAR LAMINECTOMY N/A 05/29/2015   Procedure: LEFT L4-5 MICRODISCECTOMY;  Surgeon: Lynwood FORBES Better, MD;  Location: [facility];  Service: Orthopedics;  Laterality: N/A;   TONSILLECTOMY  child   TOTAL HIP ARTHROPLASTY Left 09/07/2015   Procedure: LEFT TOTAL HIP ARTHROPLASTY ANTERIOR APPROACH;  Surgeon: Lonni CINDERELLA Poli, MD;  Location: [facility];  Service: Orthopedics;  Laterality: Left;   TOTAL HIP ARTHROPLASTY Right 07/24/2023   Procedure: RIGHT TOTAL HIP ARTHROPLASTY ANTERIOR APPROACH;  Surgeon: Poli Lonni CINDERELLA, MD;  Location: [facility];  Service: Orthopedics;  Laterality: Right;   TOTAL KNEE ARTHROPLASTY Right 10/01/2018   Procedure: RIGHT TOTAL KNEE ARTHROPLASTY;  Surgeon: Poli Lonni CINDERELLA, MD;  Location: [facility];  Service: Orthopedics;  Laterality: Right;   TUBAL LIGATION Bilateral yrs ago   Social History   Occupational History   Not on file  Tobacco Use   Smoking status: Former    Current  packs/day: 0.00    Average packs/day: 0.3 packs/day for 15.0 years (3.8 ttl pk-yrs)    Types: Cigarettes    Start date: 05/24/1962    Quit date: 05/24/1977    Years since quitting: 47.1   Smokeless tobacco: Never  Vaping Use   Vaping status: Never Used  Substance and Sexual Activity   Alcohol  use: No   Drug use: Never   Sexual activity: Not on file

## 2024-07-22 ENCOUNTER — Ambulatory Visit
Admission: RE | Admit: 2024-07-22 | Discharge: 2024-07-22 | Disposition: A | Discharge status: Home / Self Care | Source: Ambulatory Visit | Attending: Orthopedic Surgery | Admitting: Orthopedic Surgery

## 2024-07-22 DIAGNOSIS — M19011 Primary osteoarthritis, right shoulder: Secondary | ICD-10-CM | POA: Diagnosis not present

## 2024-07-22 DIAGNOSIS — G8929 Other chronic pain: Secondary | ICD-10-CM

## 2024-08-11 ENCOUNTER — Telehealth: Payer: Self-pay

## 2024-08-11 NOTE — Telephone Encounter (Signed)
   Name: TAMARRA GEISELMAN  DOB: [birth year]  MRN: [id number]  Primary Cardiologist: Alvan Ronal BRAVO, MD (Inactive)  Chart reviewed as part of pre-operative protocol coverage. Because of AANYA HAYNES past medical history and time since last visit, she will require a follow-up in-office visit in order to better assess preoperative cardiovascular risk.  Pre-op covering staff: - Please schedule appointment and call patient to inform them. If patient already had an upcoming appointment within acceptable timeframe, please add pre-op clearance to the appointment notes so provider is aware. - Please contact requesting surgeon's office via preferred method (i.e, phone, fax) to inform them of need for appointment prior to surgery.  She does not have cardiac dz and Dr. Vernetta prescribes her ASA.   Orren LOISE Fabry, PA-C  08/11/2024, 4:55 PM

## 2024-08-11 NOTE — Telephone Encounter (Signed)
   Pre-operative Risk Assessment    Patient Name: Bianca Ware  DOB: 1946-09-03 MRN: [id number]   Date of last office visit: 04/11/24 PETER SWAZILAND, MD Date of next office visit: NONE   Request for Surgical Clearance    Procedure:  RIGHT REVERSE SHOULDER ARTHROPLASTY  Date of Surgery:  Clearance 09/08/24                                Surgeon:  DR ADDIE Socks Group or Practice Name:  [facility] Phone number:  [phone] Fax number:  [phone]   Type of Clearance Requested:   - Medical  - Pharmacy:  Hold Aspirin      Type of Anesthesia:  General    Additional requests/questions:    SignedLucie DELENA Ku   08/11/2024, 4:51 PM

## 2024-08-12 NOTE — Telephone Encounter (Signed)
 Patient has upcoming appt on 08/24/23 with Reche Finder in which the preop clearance will be addressed

## 2024-08-23 ENCOUNTER — Encounter (HOSPITAL_BASED_OUTPATIENT_CLINIC_OR_DEPARTMENT_OTHER): Payer: Self-pay | Admitting: Family

## 2024-08-23 ENCOUNTER — Other Ambulatory Visit (HOSPITAL_BASED_OUTPATIENT_CLINIC_OR_DEPARTMENT_OTHER): Payer: Self-pay

## 2024-08-23 ENCOUNTER — Ambulatory Visit (HOSPITAL_BASED_OUTPATIENT_CLINIC_OR_DEPARTMENT_OTHER): Admitting: Family

## 2024-08-23 VITALS — BP 120/70 | HR 65 | Ht 67.0 in | Wt 180.0 lb

## 2024-08-23 DIAGNOSIS — Z01810 Encounter for preprocedural cardiovascular examination: Secondary | ICD-10-CM | POA: Diagnosis not present

## 2024-08-23 DIAGNOSIS — E785 Hyperlipidemia, unspecified: Secondary | ICD-10-CM | POA: Diagnosis not present

## 2024-08-23 DIAGNOSIS — I2510 Atherosclerotic heart disease of native coronary artery without angina pectoris: Secondary | ICD-10-CM | POA: Diagnosis not present

## 2024-08-23 MED ORDER — FLUZONE HIGH-DOSE 0.5 ML IM SUSY
0.5000 mL | PREFILLED_SYRINGE | Freq: Once | INTRAMUSCULAR | 0 refills | Status: AC
  Filled 2024-08-23: qty 0.5, 1d supply, fill #0

## 2024-08-23 NOTE — Patient Instructions (Signed)
 Medication Instructions:   Your physician recommends that you continue on your current medications as directed. Please refer to the Current Medication list given to you today.   *If you need a refill on your cardiac medications before your next appointment, please call your pharmacy*  Lab Work:  Your physician recommends that you return for a FASTING lipid profile, fasting after midnight at any lab corp. Patient given paperwork today.    If you have labs (blood work) drawn today and your tests are completely normal, you will receive your results only by: MyChart Message (if you have MyChart) OR A paper copy in the mail If you have any lab test that is abnormal or we need to change your treatment, we will call you to review the results.  Testing/Procedures:  None ordered.  Follow-Up: At [facility], you and your health needs are our priority.  As part of our continuing mission to provide you with exceptional heart care, our providers are all part of one team.  This team includes your primary Cardiologist (physician) and Advanced Practice Providers or APPs (Physician Assistants and Nurse Practitioners) who all work together to provide you with the care you need, when you need it.  Your next appointment:   8 month(s)  Provider:   Maude Rides, MD    We recommend signing up for the patient portal called MyChart.  Sign up information is provided on this After Visit Summary.  MyChart is used to connect with patients for Virtual Visits (Telemedicine).  Patients are able to view lab/test results, encounter notes, upcoming appointments, etc.  Non-urgent messages can be sent to your provider as well.   To learn more about what you can do with MyChart, go to [email or web address].   Other Instructions  Your physician wants you to follow-up in: 8 months.  You will receive a reminder letter in the mail two months in advance. If you don't receive a letter, please call our office to  schedule the follow-up appointment.  You can hold Asprin 7 days prior to your surgery.

## 2024-08-23 NOTE — Progress Notes (Signed)
 Cardiology Office Note   Date:  08/23/2024  ID:  Bianca Ware, Bianca Ware 04/16/1946, MRN [id number] PCP: Janey Santos, MD  [facility] Providers Cardiologist:  Alvan Ronal BRAVO, MD (Inactive)     History of Present Illness Bianca Ware is a 78 y.o. female with history of nonobstructive CAD (calcium  score 692), hyperlipidemia, mild to moderate AI by echo 05/2023, hypertension.  Preop clearance for right reverse shoulder arthroplasty. BP at home 119/60s. Reports no shortness of breath nor dyspnea on exertion. Reports no chest pain, pressure, or tightness. No edema, orthopnea, PND. Reports no palpitations.  Exercising by walking on treadmill. Exercise tolerance >4 METS.  ROS: Please see the history of present illness.    All other systems reviewed and are negative.   Studies Reviewed      Cardiac Studies & Procedures   ______________________________________________________________________________________________     ECHOCARDIOGRAM  ECHOCARDIOGRAM COMPLETE 06/17/2023  Narrative ECHOCARDIOGRAM REPORT    Patient Name:   Bianca Ware [facility] Date of Exam: 06/17/2023 Medical Rec #:  [id number]         Height:       67.0 in Accession #:    [id number]        Weight:       173.8 lb Date of Birth:  Jan 17, 1946          BSA:          1.905 m Patient Age:    77 years          BP:           122/62 mmHg Patient Gender: F                 HR:           55 bpm. Exam Location:  [address]  Procedure: 2D Echo, Cardiac Doppler, Color Doppler, 3D Echo and Strain Analysis  Indications:    R00.8 Other abnormalities of heart beat; Z01.818 Surgical clearance  History:        Patient has no prior history of Echocardiogram examinations. Elevated coronary artery calcium  score.  Sonographer:    Jon Hacker RCS Referring Phys: [phone] PETER M SWAZILAND  IMPRESSIONS   1. Left ventricular ejection fraction, by estimation, is 55 to 60%. Left ventricular ejection fraction by 3D volume is 55  %. The left ventricle has normal function. The left ventricle has no regional wall motion abnormalities. Left ventricular diastolic parameters were normal. The average left ventricular global longitudinal strain is -20.6 %. The global longitudinal strain is normal. 2. Right ventricular systolic function is normal. The right ventricular size is normal. There is normal pulmonary artery systolic pressure. The estimated right ventricular systolic pressure is 35.7 mmHg. 3. The mitral valve is grossly normal. Trivial mitral valve regurgitation. No evidence of mitral stenosis. 4. The aortic valve is tricuspid. There is mild calcification of the aortic valve. Aortic valve regurgitation is mild to moderate. Aortic valve sclerosis is present, with no evidence of aortic valve stenosis. 5. The inferior vena cava is normal in size with greater than 50% respiratory variability, suggesting right atrial pressure of 3 mmHg.  FINDINGS Left Ventricle: Left ventricular ejection fraction, by estimation, is 55 to 60%. Left ventricular ejection fraction by 3D volume is 55 %. The left ventricle has normal function. The left ventricle has no regional wall motion abnormalities. The average left ventricular global longitudinal strain is -20.6 %. The global longitudinal strain is normal. The left ventricular internal cavity size was normal in  size. There is no left ventricular hypertrophy. Left ventricular diastolic parameters were normal.  Right Ventricle: The right ventricular size is normal. No increase in right ventricular wall thickness. Right ventricular systolic function is normal. There is normal pulmonary artery systolic pressure. The tricuspid regurgitant velocity is 2.86 m/s, and with an assumed right atrial pressure of 3 mmHg, the estimated right ventricular systolic pressure is 35.7 mmHg.  Left Atrium: Left atrial size was normal in size.  Right Atrium: Right atrial size was normal in size.  Pericardium: There is  no evidence of pericardial effusion.  Mitral Valve: The mitral valve is grossly normal. Trivial mitral valve regurgitation. No evidence of mitral valve stenosis.  Tricuspid Valve: The tricuspid valve is grossly normal. Tricuspid valve regurgitation is mild . No evidence of tricuspid stenosis.  Aortic Valve: The aortic valve is tricuspid. There is mild calcification of the aortic valve. Aortic valve regurgitation is mild to moderate. Aortic regurgitation PHT measures 447 msec. Aortic valve sclerosis is present, with no evidence of aortic valve stenosis.  Pulmonic Valve: The pulmonic valve was grossly normal. Pulmonic valve regurgitation is not visualized. No evidence of pulmonic stenosis.  Aorta: The aortic root and ascending aorta are structurally normal, with no evidence of dilitation.  Venous: The inferior vena cava is normal in size with greater than 50% respiratory variability, suggesting right atrial pressure of 3 mmHg.  IAS/Shunts: The atrial septum is grossly normal.   LEFT VENTRICLE PLAX 2D LVIDd:         4.30 cm         Diastology LVIDs:         2.70 cm         LV e' medial:    9.68 cm/s LV PW:         0.90 cm         LV E/e' medial:  11.2 LV IVS:        0.80 cm         LV e' lateral:   11.10 cm/s LVOT diam:     2.00 cm         LV E/e' lateral: 9.7 LV SV:         86 LV SV Index:   45              2D LVOT Area:     3.14 cm        Longitudinal Strain 2D Strain GLS  -20.6 % Avg:  3D Volume EF LV 3D EF:    Left ventricul ar ejection fraction by 3D volume is 55 %.  3D Volume EF: 3D EF:        55 % LV EDV:       155 ml LV ESV:       70 ml LV SV:        85 ml  RIGHT VENTRICLE RV Basal diam:  2.60 cm RV S prime:     10.60 cm/s TAPSE (M-mode): 2.2 cm RVSP:           35.7 mmHg  LEFT ATRIUM           Index        RIGHT ATRIUM           Index LA diam:      3.30 cm 1.73 cm/m   RA Pressure: 3.00 mmHg LA Vol (A2C): 41.7 ml 21.89 ml/m  RA Area:     10.80 cm LA  Vol (A4C): 28.8 ml 15.12  ml/m  RA Volume:   23.10 ml  12.13 ml/m AORTIC VALVE LVOT Vmax:   119.00 cm/s LVOT Vmean:  72.700 cm/s LVOT VTI:    0.275 m AI PHT:      447 msec  AORTA Ao Root diam: 2.70 cm Ao Asc diam:  2.40 cm  MITRAL VALVE                TRICUSPID VALVE MV Area (PHT): 4.31 cm     TR Peak grad:   32.7 mmHg MV Decel Time: 176 msec     TR Vmax:        286.00 cm/s MV E velocity: 108.00 cm/s  Estimated RAP:  3.00 mmHg MV A velocity: 95.90 cm/s   RVSP:           35.7 mmHg MV E/A ratio:  1.13 SHUNTS Systemic VTI:  0.28 m Systemic Diam: 2.00 cm  Darryle Decent MD Electronically signed by Darryle Decent MD Signature Date/Time: 06/17/2023/8:47:13 PM    Final          ______________________________________________________________________________________________      Risk Assessment/Calculations           Physical Exam VS:  BP 120/70 (BP Location: Left Arm, Patient Position: Sitting, Cuff Size: Normal)   Pulse 65   Ht 5' 7 (1.702 m)   Wt 180 lb (81.6 kg)   BMI 28.19 kg/m        Wt Readings from Last 3 Encounters:  08/23/24 180 lb (81.6 kg)  04/11/24 176 lb 9.6 oz (80.1 kg)  07/24/23 223 lb (101.2 kg)    GEN: Well nourished, well developed in no acute distress NECK: No JVD; No carotid bruits CARDIAC: RRR, no murmurs, rubs, gallops RESPIRATORY:  Clear to auscultation without rales, wheezing or rhonchi  ABDOMEN: Soft, non-tender, non-distended EXTREMITIES:  No edema; No deformity   ASSESSMENT AND PLAN  Preop - According to the Revised Cardiac Risk Index (RCRI), her  . Her Functional Capacity in METs is: 5.62 according to the Duke Activity Status Index (DASI). Per AHA/ACC guidelines, she is deemed acceptable risk for the planned procedure without additional cardiovascular testing. Will route to surgical team so they are aware.  May hold Aspirin  7 days prior.  HTN - BP well controlled. Continue current antihypertensive regimen losartan 50 mg daily. .wbpho    Nonobstructive CAD / HLD, LDL goal <55 - Stable with no anginal symptoms. No indication for ischemic evaluation.  GDMT aspirin  81 mg daily, Repatha  140 mg every 14 days, Zetia  10 mg daily, Rosuvastatin  40mg  daily.  Recommend aiming for 150 minutes of moderate intensity activity per week and following a heart healthy diet.   Repeat lipid panel for monitoring after addition of Repatha , to be collected within next 2 weeks       Dispo: follow up in 03/2025 with Dr. Swaziland   Signed, Reche GORMAN Finder, NP

## 2024-08-25 DIAGNOSIS — E785 Hyperlipidemia, unspecified: Secondary | ICD-10-CM | POA: Diagnosis not present

## 2024-08-25 DIAGNOSIS — Z01810 Encounter for preprocedural cardiovascular examination: Secondary | ICD-10-CM | POA: Diagnosis not present

## 2024-08-26 ENCOUNTER — Ambulatory Visit (HOSPITAL_BASED_OUTPATIENT_CLINIC_OR_DEPARTMENT_OTHER): Payer: Self-pay | Admitting: Family

## 2024-08-26 LAB — LIPID PANEL
Chol/HDL Ratio: 2.1 ratio (ref 0.0–4.4)
Cholesterol, Total: 96 mg/dL — ABNORMAL LOW (ref 100–199)
HDL: 46 mg/dL (ref >39)
LDL Chol Calc (NIH): 31 mg/dL (ref 0–99)
Triglycerides: 99 mg/dL (ref 0–149)
VLDL Cholesterol Cal: 19 mg/dL (ref 5–40)

## 2024-08-29 ENCOUNTER — Telehealth: Payer: Self-pay | Admitting: Orthopedic Surgery

## 2024-08-29 NOTE — Telephone Encounter (Signed)
 We will plan to get her set up for home health occupational therapy instead

## 2024-08-29 NOTE — Telephone Encounter (Signed)
 Adelle from [facility] called and said the order Addie put in for her to have for the continuous motion device isn't covered by her insurance. RA#[phone]

## 2024-08-30 ENCOUNTER — Telehealth: Payer: Self-pay | Admitting: Cardiology

## 2024-08-30 NOTE — Telephone Encounter (Signed)
 Called and spoke to patient and per pt she had visit 9/30 and was told per Reche Finder, NP to stop Zetia .  Per pt she has continued taking Zetia  and would like to make provider, Dr. Swaziland aware. Per pt provider has known her for a great length of time. Patient would like advise and recommendations from Dr. Swaziland on continuing Zetia . Made pt aware that this will be forward to provider for recommendations. Pt verbalized an understanding.

## 2024-08-30 NOTE — Telephone Encounter (Signed)
 Pt is requesting a callback from [place] regarding labs. Please advise.

## 2024-09-01 ENCOUNTER — Encounter (HOSPITAL_COMMUNITY): Payer: Self-pay

## 2024-09-01 NOTE — Pre-Procedure Instructions (Addendum)
 Surgical Instructions   Your procedure is scheduled on September 08, 2024. Report to [facility] Main Entrance A at 5:30 A.M., then check in with the Admitting office. Any questions or running late day of surgery: call [phone]  Questions prior to your surgery date: call [phone], Monday-Friday, 8am-4pm. If you experience any cold or flu symptoms such as cough, fever, chills, shortness of breath, etc. between now and your scheduled surgery, please notify us  at the above number.     Remember:  Do not eat after midnight the night before your surgery  You may drink clear liquids until 4:30 AM the morning of your surgery.   Clear liquids allowed are: Water , Non-Citrus Juices (without pulp), Carbonated Beverages, Clear Tea (no milk, honey, etc.), Black Coffee Only (NO MILK, CREAM OR POWDERED CREAMER of any kind), and Gatorade.  Patient Instructions  The night before surgery:  No food after midnight. ONLY clear liquids after midnight  The day of surgery (if you do NOT have diabetes):  Drink ONE (1) Pre-Surgery Clear Ensure by 4:30 AM the morning of surgery. Drink in one sitting. Do not sip.  This drink was given to you during your hospital  pre-op appointment visit.  Nothing else to drink after completing the  Pre-Surgery Clear Ensure.         If you have questions, please contact your surgeon's office.    Take these medicines the morning of surgery with A SIP OF WATER : acetaminophen  (TYLENOL )  busPIRone  (BUSPAR )  cetirizine (ZYRTEC)  gabapentin  (NEURONTIN )  omeprazole (PRILOSEC)    May take these medicines IF NEEDED: albuterol (VENTOLIN HFA) inhaler - please bring inhaler with you morning of surgery. methocarbamol  (ROBAXIN )  oxyCODONE  (OXY IR/ROXICODONE )    STOP taking your Aspirin  5 to 7 days prior to surgery. DO NOT take any doses after October 10th.     One week prior to surgery, STOP taking any Aleve, Naproxen, Ibuprofen, Motrin, Advil, Goody's, BC's, all  herbal medications, fish oil Omega-3, and non-prescription vitamins.                     Do NOT Smoke (Tobacco/Vaping) for 24 hours prior to your procedure.  If you use a CPAP at night, you may bring your mask/headgear for your overnight stay.   You will be asked to remove any contacts, glasses, piercing's, hearing aid's, dentures/partials prior to surgery. Please bring cases for these items if needed.    Patients discharged the day of surgery will not be allowed to drive home, and someone needs to stay with them for 24 hours.  SURGICAL WAITING ROOM VISITATION Patients may have no more than 2 support people in the waiting area - these visitors may rotate.   Pre-op nurse will coordinate an appropriate time for 1 ADULT support person, who may not rotate, to accompany patient in pre-op.  Children under the age of 35 must have an adult with them who is not the patient and must remain in the main waiting area with an adult.  If the patient needs to stay at the hospital during part of their recovery, the visitor guidelines for inpatient rooms apply.  Please refer to the [facility] website for the visitor guidelines for any additional information.   If you received a COVID test during your pre-op visit  it is requested that you wear a mask when out in public, stay away from anyone that may not be feeling well and notify your surgeon if you develop symptoms.  If you have been in contact with anyone that has tested positive in the last 10 days please notify you surgeon.      Pre-operative 4 CHG Bathing Instructions   You can play a key role in reducing the risk of infection after surgery. Your skin needs to be as free of germs as possible. You can reduce the number of germs on your skin by washing with CHG (chlorhexidine  gluconate) soap before surgery. CHG is an antiseptic soap that kills germs and continues to kill germs even after washing.   DO NOT use if you have an allergy to  chlorhexidine /CHG or antibacterial soaps. If your skin becomes reddened or irritated, stop using the CHG and notify one of our RNs at [phone].   Please shower with the CHG soap starting 4 days before surgery using the following schedule:     Please keep in mind the following:  DO NOT shave, including legs and underarms, starting the day of your first shower.   You may shave your face at any point before/day of surgery.  Place clean sheets on your bed the day you start using CHG soap. Use a clean washcloth (not used since being washed) for each shower. DO NOT sleep with pets once you start using the CHG.   CHG Shower Instructions:  Wash your face and private area with normal soap. If you choose to wash your hair, wash first with your normal shampoo.  After you use shampoo/soap, rinse your hair and body thoroughly to remove shampoo/soap residue.  Turn the water  OFF and apply  bottle of CHG soap to a CLEAN washcloth.  Apply CHG soap ONLY FROM YOUR NECK DOWN TO YOUR TOES (washing for 3-5 minutes)  DO NOT use CHG soap on face, private areas, open wounds, or sores.  Pay special attention to the area where your surgery is being performed.  If you are having back surgery, having someone wash your back for you may be helpful. Wait 2 minutes after CHG soap is applied, then you may rinse off the CHG soap.  Pat dry with a clean towel  Put on clean clothes/pajamas   If you choose to wear lotion, please use ONLY the CHG-compatible lotions that are listed below.  Additional instructions for the day of surgery:  If you choose, you may shower the morning of surgery with an antibacterial soap.  DO NOT APPLY any lotions, deodorants, cologne, or perfumes.   Do not bring valuables to the hospital. [facility] is not responsible for any belongings/valuables. Do not wear nail polish, gel polish, artificial nails, or any other type of covering on natural nails (fingers and toes) Do not wear jewelry or  makeup Put on clean/comfortable clothes.  Please brush your teeth.  Ask your nurse before applying any prescription medications to the skin.     CHG Compatible Lotions   Aveeno Moisturizing lotion  Cetaphil Moisturizing Cream  Cetaphil Moisturizing Lotion  Clairol Herbal Essence Moisturizing Lotion, Dry Skin  Clairol Herbal Essence Moisturizing Lotion, Extra Dry Skin  Clairol Herbal Essence Moisturizing Lotion, Normal Skin  Curel Age Defying Therapeutic Moisturizing Lotion with Alpha Hydroxy  Curel Extreme Care Body Lotion  Curel Soothing Hands Moisturizing Hand Lotion  Curel Therapeutic Moisturizing Cream, Fragrance-Free  Curel Therapeutic Moisturizing Lotion, Fragrance-Free  Curel Therapeutic Moisturizing Lotion, Original Formula  Eucerin Daily Replenishing Lotion  Eucerin Dry Skin Therapy Plus Alpha Hydroxy Crme  Eucerin Dry Skin Therapy Plus Alpha Hydroxy Lotion  Eucerin Original Crme  Eucerin  Original Lotion  Eucerin Plus Crme Eucerin Plus Lotion  Eucerin TriLipid Replenishing Lotion  Keri Anti-Bacterial Hand Lotion  Keri Deep Conditioning Original Lotion Dry Skin Formula Softly Scented  Keri Deep Conditioning Original Lotion, Fragrance Free Sensitive Skin Formula  Keri Lotion Fast Absorbing Fragrance Free Sensitive Skin Formula  Keri Lotion Fast Absorbing Softly Scented Dry Skin Formula  Keri Original Lotion  Keri Skin Renewal Lotion Keri Silky Smooth Lotion  Keri Silky Smooth Sensitive Skin Lotion  Nivea Body Creamy Conditioning Oil  Nivea Body Extra Enriched Lotion  Nivea Body Original Lotion  Nivea Body Sheer Moisturizing Lotion Nivea Crme  Nivea Skin Firming Lotion  NutraDerm 30 Skin Lotion  NutraDerm Skin Lotion  NutraDerm Therapeutic Skin Cream  NutraDerm Therapeutic Skin Lotion  ProShield Protective Hand Cream  Provon moisturizing lotion    [place]- Preparing for Total Shoulder Arthroplasty  Before surgery, you can play an important role.  Because skin is not sterile, your skin needs to be as free of germs as possible. You can reduce the number of germs on your skin by using the following products.   Benzoyl Peroxide Gel  o Reduces the number of germs present on the skin  o Applied twice a day to shoulder area starting two days before surgery   Chlorhexidine  Gluconate (CHG) Soap (instructions listed above on how to wash with CHG Soap)  o An antiseptic cleaner that kills germs and bonds with the skin to continue killing germs even after washing  o Used for showering the night before surgery and morning of surgery   ==================================================================  Please follow these instructions carefully:  BENZOYL PEROXIDE 5% GEL  Please do not use if you have an allergy to benzoyl peroxide. If your skin becomes reddened/irritated stop using the benzoyl peroxide.  Starting two days before surgery, apply as follows:  1. Apply benzoyl peroxide in the morning and at night. Apply after taking a shower. If you are not taking a shower clean entire shoulder front, back, and side along with the armpit with a clean wet washcloth.  2. Place a quarter-sized dollop on your SHOULDER and rub in thoroughly, making sure to cover the front, back, and side of your shoulder, along with the armpit.   2 Days prior to Surgery First Dose on _____________ Morning Second Dose on ______________ Night  Day Before Surgery First Dose on ______________ Morning Night before surgery wash (entire body except face and private areas) with CHG Soap THEN Second Dose on ____________ Night   Morning of Surgery  wash BODY AGAIN with CHG Soap   4. Do NOT apply benzoyl peroxide gel on the day of surgery  Please read over the following fact sheets that you were given.

## 2024-09-02 ENCOUNTER — Encounter (HOSPITAL_COMMUNITY)
Admission: RE | Admit: 2024-09-02 | Discharge: 2024-09-02 | Disposition: A | Discharge status: Home / Self Care | Source: Ambulatory Visit | Attending: Orthopedic Surgery | Admitting: Orthopedic Surgery

## 2024-09-02 ENCOUNTER — Encounter (HOSPITAL_COMMUNITY): Payer: Self-pay

## 2024-09-02 ENCOUNTER — Other Ambulatory Visit: Payer: Self-pay

## 2024-09-02 VITALS — BP 160/62 | HR 66 | Temp 98.2°F | Resp 17 | Ht 67.0 in | Wt 180.0 lb

## 2024-09-02 DIAGNOSIS — Z01812 Encounter for preprocedural laboratory examination: Secondary | ICD-10-CM | POA: Insufficient documentation

## 2024-09-02 DIAGNOSIS — I351 Nonrheumatic aortic (valve) insufficiency: Secondary | ICD-10-CM | POA: Diagnosis not present

## 2024-09-02 DIAGNOSIS — Z01818 Encounter for other preprocedural examination: Secondary | ICD-10-CM

## 2024-09-02 DIAGNOSIS — M19011 Primary osteoarthritis, right shoulder: Secondary | ICD-10-CM | POA: Diagnosis not present

## 2024-09-02 DIAGNOSIS — Z87891 Personal history of nicotine dependence: Secondary | ICD-10-CM | POA: Insufficient documentation

## 2024-09-02 LAB — CBC
HCT: 43.5 % (ref 36.0–46.0)
Hemoglobin: 14.1 g/dL (ref 12.0–15.0)
MCH: 29.6 pg (ref 26.0–34.0)
MCHC: 32.4 g/dL (ref 30.0–36.0)
MCV: 91.2 fL (ref 80.0–100.0)
Platelets: 281 K/uL (ref 150–400)
RBC: 4.77 MIL/uL (ref 3.87–5.11)
RDW: 13.2 % (ref 11.5–15.5)
WBC: 8.3 K/uL (ref 4.0–10.5)
nRBC: 0 % (ref 0.0–0.2)

## 2024-09-02 LAB — BASIC METABOLIC PANEL WITH GFR
Anion gap: 9 (ref 5–15)
BUN: 10 mg/dL (ref 8–23)
CO2: 27 mmol/L (ref 22–32)
Calcium: 9.1 mg/dL (ref 8.9–10.3)
Chloride: 103 mmol/L (ref 98–111)
Creatinine, Ser: 0.67 mg/dL (ref 0.44–1.00)
GFR, Estimated: >60 mL/min (ref >60)
Glucose, Bld: 129 mg/dL — ABNORMAL HIGH (ref 70–99)
Potassium: 3.9 mmol/L (ref 3.5–5.1)
Sodium: 139 mmol/L (ref 135–145)

## 2024-09-02 LAB — URINALYSIS, W/ REFLEX TO CULTURE (INFECTION SUSPECTED)
Bilirubin Urine: NEGATIVE
Glucose, UA: NEGATIVE mg/dL
Hgb urine dipstick: NEGATIVE
Ketones, ur: NEGATIVE mg/dL
Leukocytes,Ua: NEGATIVE
Nitrite: NEGATIVE
Protein, ur: NEGATIVE mg/dL
Specific Gravity, Urine: 1.011 (ref 1.005–1.030)
pH: 5 (ref 5.0–8.0)

## 2024-09-02 LAB — SURGICAL PCR SCREEN
MRSA, PCR: NEGATIVE
Staphylococcus aureus: NEGATIVE

## 2024-09-02 NOTE — Telephone Encounter (Signed)
 Called patient and made her aware per Dr. Swaziland given her great response to Repatha  , she can stop Zetia . Understanding verbalized

## 2024-09-02 NOTE — Progress Notes (Signed)
 PCP - Dr. Roylene Overcast Cardiologist : Dr. Peter Swaziland,  LOV 08/23/2024  PPM/ICD - denies Device Orders - na Rep Notified - na  Chest x-ray - na EKG - 08/23/2024 Stress Test -  ECHO - 06/17/2023 Cardiac Cath -   Sleep Study - denies CPAP - na  Non-diabetic  Blood Thinner Instructions: denies Aspirin  Instructions: states last dose 08/29/2024  ERAS Protcol - Ensure until 0430  Anesthesia review: Yes. CAD, aortic insufficiency,  cardiac clearance  Patient denies shortness of breath, fever, cough and chest pain at PAT appointment   All instructions explained to the patient, with a verbal understanding of the material. Patient agrees to go over the instructions while at home for a better understanding. Patient also instructed to self quarantine after being tested for COVID-19. The opportunity to ask questions was provided.

## 2024-09-05 NOTE — Progress Notes (Signed)
 Anesthesia Chart Review:  Case: [id number] Date/Time: 09/08/24 0715   Procedure: ARTHROPLASTY, SHOULDER, TOTAL, REVERSE (Right: Shoulder)   Anesthesia type: General   Diagnosis: Primary osteoarthritis of right shoulder [M19.011]   Pre-op diagnosis: right shoulder osteoarthritis   [facility] / [facility]   Surgeons: Addie Cordella Hamilton, MD       DISCUSSION: Patient is a 78 year old female scheduled for the above procedure.  History includes former smoker (quit 1978), postoperative N/V, carotid artery stenosis 50-69% RICA 11/2022; no significant stenosis BICA 11/2023 US ), aortic insufficiency (mild-moderate 06/17/2023), elevated coronary calcium  score (692, 89th percentile 03/24/2023) osteoarthritis (left THA 09/08/2015; right TKA 10/01/2018; right THA 07/24/2023), spinal surgery (left L4-5 microdiscectomy 05/29/2015).   Preoperative cardiology assessment on 08/23/2024 by Vannie Mora, NP, Preop - According to the Revised Cardiac Risk Index (RCRI), her  . Her Functional Capacity in METs is: 5.62 according to the Duke Activity Status Index (DASI). Per AHA/ACC guidelines, she is deemed acceptable risk for the planned procedure without additional cardiovascular testing. Will route to surgical team so they are aware.  May hold Aspirin  7 days prior.  Last aspirin  reported as 08/29/2024. She is on Repatha , ASA 81 mg, Crestor . Given good response to Repatha , cardiology felt she could stop Zetia . Follow-up ~ May 2026 is planned.   Anesthesia team to evaluate on the day of surgery.   VS: BP (!) 160/62   Pulse 66   Temp 36.8 C   Resp 17   Ht 5' 7 (1.702 m)   Wt 81.6 kg   SpO2 97%   BMI 28.19 kg/m    PROVIDERS: Janey Santos, MD his PCP [place], Peter, MD is cardiologist  LABS: Labs reviewed: Acceptable for surgery. (all labs ordered are listed, but only abnormal results are displayed)  Labs Reviewed  BASIC METABOLIC PANEL WITH GFR - Abnormal; Notable for the following components:       Result Value   Glucose, Bld 129 (*)    All other components within normal limits  URINALYSIS, W/ REFLEX TO CULTURE (INFECTION SUSPECTED) - Abnormal; Notable for the following components:   APPearance HAZY (*)    Bacteria, UA RARE (*)    All other components within normal limits  SURGICAL PCR SCREEN  CBC     IMAGES: CT right shoulder 07/22/2024: IMPRESSION: 1. Severe osteoarthritis of the glenohumeral joint. 2.  No acute osseous injury of the right shoulder.    EKG: 08/23/2024: Normal sinus rhythm with sinus arrhythmia Poor R wave progression No acute ST/T wave changes Confirmed by Vannie Mora ([id number]) on 08/23/2024 9:17:39 AM   CV: US  Carotid 12/07/2023: IMPRESSION: Color duplex indicates minimal heterogeneous plaque, with no hemodynamically significant stenosis by duplex criteria in the extracranial cerebrovascular circulation. - Comparison 11/25/2022: RICA 50-69%, left ICA < 50%.   Echo 06/17/2023: 1. Left ventricular ejection fraction, by estimation, is 55 to 60%. Left  ventricular ejection fraction by 3D volume is 55 %. The left ventricle has  normal function. The left ventricle has no regional wall motion  abnormalities. Left ventricular diastolic   parameters were normal. The average left ventricular global longitudinal  strain is -20.6 %. The global longitudinal strain is normal.   2. Right ventricular systolic function is normal. The right ventricular  size is normal. There is normal pulmonary artery systolic pressure. The  estimated right ventricular systolic pressure is 35.7 mmHg.   3. The mitral valve is grossly normal. Trivial mitral valve  regurgitation. No evidence of mitral stenosis.  4. The aortic valve is tricuspid. There is mild calcification of the  aortic valve. Aortic valve regurgitation is mild to moderate. Aortic valve  sclerosis is present, with no evidence of aortic valve stenosis.   5. The inferior vena cava is normal in size with greater  than 50%  respiratory variability, suggesting right atrial pressure of 3 mmHg.    CT Cardiac Calcium  Scoring 03/24/2023: IMPRESSION: 1. Patient's total coronary artery calcium  score is 692 which is 89th percentile for patient's of matched age, gender and race/ethnicity. Please note that although the presence of coronary artery calcium  documents the presence of coronary artery disease, the severity of this disease and any potential stenosis cannot be assessed on this noncontrast CT examination. Assessment for potential risk factor modification, dietary therapy or pharmacologic therapy may be warranted, if clinically indicated. 2.  Aortic Atherosclerosis (ICD10-I70.0). 3. Multiple small pulmonary nodules scattered throughout the lungs bilaterally measuring 4 mm or less in size, nonspecific, but statistically likely benign. No follow-up needed if patient is low-risk (and has no known or suspected primary neoplasm). Non-contrast chest CT can be considered in 12 months if patient is high-risk. This recommendation follows the consensus statement: Guidelines for Management of Incidental Pulmonary Nodules Detected on CT Images: From the Fleischner Society 2017; Radiology 2017; 284:228-243.    Past Medical History:  Diagnosis Date   Aortic insufficiency 2024   Arthritis    hands   Complication of anesthesia    waking up   Elevated coronary artery calcium  score    OA (osteoarthritis) of knee    right and left   Pneumonia    PONV (postoperative nausea and vomiting)    Seasonal allergies    Wears glasses     Past Surgical History:  Procedure Laterality Date   ABDOMINAL HYSTERECTOMY  1970s   BLADDER SUSPENSION  2002   w/ urethral sling   CATARACT EXTRACTION W/ INTRAOCULAR LENS IMPLANT Bilateral 2018   CESAREAN SECTION  x2   yrs ago   DILATION AND CURETTAGE OF UTERUS  1960s   LUMBAR LAMINECTOMY N/A 05/29/2015   Procedure: LEFT L4-5 MICRODISCECTOMY;  Surgeon: Lynwood FORBES Better, MD;   Location: [facility];  Service: Orthopedics;  Laterality: N/A;   TONSILLECTOMY  child   TOTAL HIP ARTHROPLASTY Left 09/07/2015   Procedure: LEFT TOTAL HIP ARTHROPLASTY ANTERIOR APPROACH;  Surgeon: Lonni CINDERELLA Poli, MD;  Location: [facility];  Service: Orthopedics;  Laterality: Left;   TOTAL HIP ARTHROPLASTY Right 07/24/2023   Procedure: RIGHT TOTAL HIP ARTHROPLASTY ANTERIOR APPROACH;  Surgeon: Poli Lonni CINDERELLA, MD;  Location: [facility];  Service: Orthopedics;  Laterality: Right;   TOTAL KNEE ARTHROPLASTY Right 10/01/2018   Procedure: RIGHT TOTAL KNEE ARTHROPLASTY;  Surgeon: Poli Lonni CINDERELLA, MD;  Location: [facility];  Service: Orthopedics;  Laterality: Right;   TUBAL LIGATION Bilateral yrs ago    MEDICATIONS:  acetaminophen  (TYLENOL ) 650 MG CR tablet   albuterol (VENTOLIN HFA) 108 (90 Base) MCG/ACT inhaler   Ascorbic Acid  (VITAMIN C ) 1000 MG tablet   aspirin  EC 81 MG tablet   busPIRone  (BUSPAR ) 7.5 MG tablet   Calcium  Carbonate-Vitamin D  (CALCIUM  + D PO)   cetirizine (ZYRTEC) 10 MG tablet   Cholecalciferol  (VITAMIN D ) 2000 units tablet   estradiol (ESTRACE) 0.1 MG/GM vaginal cream   Evolocumab  (REPATHA  SURECLICK) 140 MG/ML SOAJ   ezetimibe  (ZETIA ) 10 MG tablet   gabapentin  (NEURONTIN ) 100 MG capsule   losartan (COZAAR) 50 MG tablet   methocarbamol  (ROBAXIN ) 500 MG tablet  Multiple Vitamins-Minerals (HAIR SKIN NAILS PO)   Nutritional Supplements (ESTROVEN PO)   Omega-3 1000 MG CAPS   omeprazole (PRILOSEC) 20 MG capsule   oxyCODONE  (OXY IR/ROXICODONE ) 5 MG immediate release tablet   Probiotic Product (PROBIOTIC PO)   rosuvastatin  (CRESTOR ) 40 MG tablet   traZODone  (DESYREL ) 50 MG tablet   No current facility-administered medications for this encounter.    Isaiah Ruder, PA-C Surgical Short Stay/Anesthesiology [facility] Phone [phone] [facility] Phone [phone] 09/05/2024 4:54 PM

## 2024-09-05 NOTE — Anesthesia Preprocedure Evaluation (Signed)
 Anesthesia Evaluation  Patient identified by MRN, date of birth, ID band Patient awake    Reviewed: Allergy & Precautions, NPO status , Patient's Chart, lab work & pertinent test results  History of Anesthesia Complications (+) PONV and history of anesthetic complications  Airway Mallampati: III  TM Distance: >3 FB Neck ROM: Full    Dental  (+) Teeth Intact, Dental Advisory Given   Pulmonary neg shortness of breath, neg sleep apnea, neg COPD, neg recent URI, former smoker   breath sounds clear to auscultation       Cardiovascular hypertension, Pt. on medications (-) angina + CAD  (-) Past MI, (-) Cardiac Stents, (-) CABG and (-) CHF (-) dysrhythmias  Rhythm:Regular  1. Left ventricular ejection fraction, by estimation, is 55 to 60%. Left  ventricular ejection fraction by 3D volume is 55 %. The left ventricle has  normal function. The left ventricle has no regional wall motion  abnormalities. Left ventricular diastolic   parameters were normal. The average left ventricular global longitudinal  strain is -20.6 %. The global longitudinal strain is normal.   2. Right ventricular systolic function is normal. The right ventricular  size is normal. There is normal pulmonary artery systolic pressure. The  estimated right ventricular systolic pressure is 35.7 mmHg.   3. The mitral valve is grossly normal. Trivial mitral valve  regurgitation. No evidence of mitral stenosis.   4. The aortic valve is tricuspid. There is mild calcification of the  aortic valve. Aortic valve regurgitation is mild to moderate. Aortic valve  sclerosis is present, with no evidence of aortic valve stenosis.   5. The inferior vena cava is normal in size with greater than 50%  respiratory variability, suggesting right atrial pressure of 3 mmHg.     Neuro/Psych neg Seizures  negative psych ROS   GI/Hepatic negative GI ROS, Neg liver ROS,,,  Endo/Other  negative  endocrine ROS    Renal/GU negative Renal ROS     Musculoskeletal  (+) Arthritis ,    Abdominal   Peds  Hematology negative hematology ROS (+) Lab Results      Component                Value               Date                      WBC                      8.3                 09/02/2024                HGB                      14.1                09/02/2024                HCT                      43.5                09/02/2024                MCV  91.2                09/02/2024                PLT                      281                 09/02/2024              Anesthesia Other Findings   Reproductive/Obstetrics                              Anesthesia Physical Anesthesia Plan  ASA: 2  Anesthesia Plan: General and Regional   Post-op Pain Management: Regional block* and Ofirmev  IV (intra-op)*   Induction: Intravenous  PONV Risk Score and Plan: 4 or greater and Ondansetron , Dexamethasone , Propofol  infusion and TIVA  Airway Management Planned: Oral ETT  Additional Equipment: None  Intra-op Plan:   Post-operative Plan: Extubation in OR  Informed Consent: I have reviewed the patients History and Physical, chart, labs and discussed the procedure including the risks, benefits and alternatives for the proposed anesthesia with the patient or authorized representative who has indicated his/her understanding and acceptance.     Dental advisory given  Plan Discussed with: CRNA  Anesthesia Plan Comments: (PAT note written 09/05/2024 by Blakely Maranan, PA-C.  )         Anesthesia Quick Evaluation

## 2024-09-08 ENCOUNTER — Observation Stay (HOSPITAL_COMMUNITY)
Admission: RE | Admit: 2024-09-08 | Discharge: 2024-09-09 | Disposition: A | Discharge status: Home / Self Care | Attending: Orthopedic Surgery | Admitting: Orthopedic Surgery

## 2024-09-08 ENCOUNTER — Encounter (HOSPITAL_COMMUNITY): Payer: Self-pay | Admitting: Orthopedic Surgery

## 2024-09-08 ENCOUNTER — Ambulatory Visit (HOSPITAL_COMMUNITY): Payer: Self-pay | Admitting: Vascular Surgery

## 2024-09-08 ENCOUNTER — Ambulatory Visit (HOSPITAL_COMMUNITY): Admitting: Anesthesiology

## 2024-09-08 ENCOUNTER — Encounter (HOSPITAL_COMMUNITY)
Admission: RE | Disposition: A | Discharge status: Home / Self Care | Payer: Self-pay | Source: Home / Self Care | Attending: Orthopedic Surgery

## 2024-09-08 ENCOUNTER — Observation Stay (HOSPITAL_COMMUNITY)

## 2024-09-08 ENCOUNTER — Other Ambulatory Visit: Payer: Self-pay

## 2024-09-08 DIAGNOSIS — M19011 Primary osteoarthritis, right shoulder: Principal | ICD-10-CM

## 2024-09-08 DIAGNOSIS — Z96611 Presence of right artificial shoulder joint: Secondary | ICD-10-CM | POA: Diagnosis not present

## 2024-09-08 DIAGNOSIS — Z01818 Encounter for other preprocedural examination: Principal | ICD-10-CM

## 2024-09-08 DIAGNOSIS — I2510 Atherosclerotic heart disease of native coronary artery without angina pectoris: Secondary | ICD-10-CM | POA: Insufficient documentation

## 2024-09-08 DIAGNOSIS — Z87891 Personal history of nicotine dependence: Secondary | ICD-10-CM | POA: Insufficient documentation

## 2024-09-08 DIAGNOSIS — Z7982 Long term (current) use of aspirin: Secondary | ICD-10-CM | POA: Diagnosis not present

## 2024-09-08 DIAGNOSIS — G8918 Other acute postprocedural pain: Secondary | ICD-10-CM | POA: Diagnosis not present

## 2024-09-08 DIAGNOSIS — Z471 Aftercare following joint replacement surgery: Secondary | ICD-10-CM | POA: Diagnosis not present

## 2024-09-08 HISTORY — PX: REVERSE SHOULDER ARTHROPLASTY: SHX5054

## 2024-09-08 SURGERY — ARTHROPLASTY, SHOULDER, TOTAL, REVERSE
Anesthesia: Regional | Site: Shoulder | Laterality: Right

## 2024-09-08 MED ORDER — METOCLOPRAMIDE HCL 5 MG PO TABS: 5.0000 mg | ORAL_TABLET | Freq: Three times a day (TID) | ORAL | Status: DC | PRN

## 2024-09-08 MED ORDER — TRANEXAMIC ACID-NACL 1000-0.7 MG/100ML-% IV SOLN
1000.0000 mg | INTRAVENOUS | Status: AC
  Administered 2024-09-08: 1000 mg via INTRAVENOUS
  Filled 2024-09-08: qty 100

## 2024-09-08 MED ORDER — CELECOXIB 100 MG PO CAPS
100.0000 mg | ORAL_CAPSULE | Freq: Every day | ORAL | Status: DC
  Administered 2024-09-08: 100 mg via ORAL
  Filled 2024-09-08: qty 1

## 2024-09-08 MED ORDER — DROPERIDOL 2.5 MG/ML IJ SOLN
INTRAMUSCULAR | Status: AC
  Filled 2024-09-08: qty 2

## 2024-09-08 MED ORDER — LIDOCAINE 2% (20 MG/ML) 5 ML SYRINGE
INTRAMUSCULAR | Status: AC
  Filled 2024-09-08: qty 5

## 2024-09-08 MED ORDER — ALBUTEROL SULFATE (2.5 MG/3ML) 0.083% IN NEBU: 3.0000 mL | INHALATION_SOLUTION | Freq: Four times a day (QID) | RESPIRATORY_TRACT | Status: DC | PRN

## 2024-09-08 MED ORDER — BUPIVACAINE-EPINEPHRINE (PF) 0.5% -1:200000 IJ SOLN
INTRAMUSCULAR | Status: DC | PRN
  Administered 2024-09-08: 20 mL via PERINEURAL

## 2024-09-08 MED ORDER — OXYCODONE HCL 5 MG PO TABS
5.0000 mg | ORAL_TABLET | ORAL | Status: DC | PRN
  Administered 2024-09-08 – 2024-09-09 (×2): 5 mg via ORAL
  Filled 2024-09-08 (×2): qty 1

## 2024-09-08 MED ORDER — TRAZODONE HCL 50 MG PO TABS
50.0000 mg | ORAL_TABLET | Freq: Every day | ORAL | Status: DC
  Administered 2024-09-08: 50 mg via ORAL
  Filled 2024-09-08: qty 1

## 2024-09-08 MED ORDER — EPHEDRINE SULFATE-NACL 50-0.9 MG/10ML-% IV SOSY
PREFILLED_SYRINGE | INTRAVENOUS | Status: DC | PRN
  Administered 2024-09-08: 5 mg via INTRAVENOUS

## 2024-09-08 MED ORDER — METOCLOPRAMIDE HCL 5 MG/ML IJ SOLN: 5.0000 mg | Freq: Three times a day (TID) | INTRAMUSCULAR | Status: DC | PRN

## 2024-09-08 MED ORDER — BUPIVACAINE LIPOSOME 1.3 % IJ SUSP
INTRAMUSCULAR | Status: AC
  Filled 2024-09-08: qty 20

## 2024-09-08 MED ORDER — LIDOCAINE 2% (20 MG/ML) 5 ML SYRINGE
INTRAMUSCULAR | Status: DC | PRN
  Administered 2024-09-08: 60 mg via INTRAVENOUS

## 2024-09-08 MED ORDER — METHOCARBAMOL 500 MG PO TABS
500.0000 mg | ORAL_TABLET | Freq: Four times a day (QID) | ORAL | Status: DC | PRN
  Administered 2024-09-08 – 2024-09-09 (×2): 500 mg via ORAL
  Filled 2024-09-08 (×2): qty 1

## 2024-09-08 MED ORDER — DOCUSATE SODIUM 100 MG PO CAPS
100.0000 mg | ORAL_CAPSULE | Freq: Two times a day (BID) | ORAL | Status: DC
  Administered 2024-09-08 (×2): 100 mg via ORAL
  Filled 2024-09-08 (×2): qty 1

## 2024-09-08 MED ORDER — SODIUM CHLORIDE 0.9 % IV SOLN: INTRAVENOUS | Status: DC

## 2024-09-08 MED ORDER — IRRISEPT - 450ML BOTTLE WITH 0.05% CHG IN STERILE WATER, USP 99.95% OPTIME
TOPICAL | Status: DC | PRN
  Administered 2024-09-08: 450 mL

## 2024-09-08 MED ORDER — FENTANYL CITRATE (PF) 100 MCG/2ML IJ SOLN
INTRAMUSCULAR | Status: AC
  Filled 2024-09-08: qty 2

## 2024-09-08 MED ORDER — PROPOFOL 500 MG/50ML IV EMUL
INTRAVENOUS | Status: DC | PRN
  Administered 2024-09-08: 125 ug/kg/min via INTRAVENOUS

## 2024-09-08 MED ORDER — ROCURONIUM BROMIDE 10 MG/ML (PF) SYRINGE
PREFILLED_SYRINGE | INTRAVENOUS | Status: AC
  Filled 2024-09-08: qty 10

## 2024-09-08 MED ORDER — ONDANSETRON HCL 4 MG PO TABS: 4.0000 mg | ORAL_TABLET | Freq: Four times a day (QID) | ORAL | Status: DC | PRN

## 2024-09-08 MED ORDER — FENTANYL CITRATE (PF) 100 MCG/2ML IJ SOLN
INTRAMUSCULAR | Status: DC | PRN
  Administered 2024-09-08 (×2): 50 ug via INTRAVENOUS

## 2024-09-08 MED ORDER — ORAL CARE MOUTH RINSE: 15.0000 mL | Freq: Once | OROMUCOSAL | Status: AC

## 2024-09-08 MED ORDER — BUPIVACAINE LIPOSOME 1.3 % IJ SUSP
INTRAMUSCULAR | Status: DC | PRN
  Administered 2024-09-08: 10 mL via PERINEURAL

## 2024-09-08 MED ORDER — ACETAMINOPHEN 325 MG PO TABS: 325.0000 mg | ORAL_TABLET | Freq: Four times a day (QID) | ORAL | Status: DC | PRN

## 2024-09-08 MED ORDER — OXYCODONE HCL 5 MG/5ML PO SOLN: 5.0000 mg | Freq: Once | ORAL | Status: DC | PRN

## 2024-09-08 MED ORDER — CEFAZOLIN SODIUM-DEXTROSE 2-4 GM/100ML-% IV SOLN
2.0000 g | Freq: Three times a day (TID) | INTRAVENOUS | Status: AC
  Administered 2024-09-08 – 2024-09-09 (×3): 2 g via INTRAVENOUS
  Filled 2024-09-08 (×3): qty 100

## 2024-09-08 MED ORDER — PANTOPRAZOLE SODIUM 40 MG PO TBEC: 40.0000 mg | DELAYED_RELEASE_TABLET | Freq: Every day | ORAL | Status: DC

## 2024-09-08 MED ORDER — ASPIRIN 81 MG PO TBEC: 81.0000 mg | DELAYED_RELEASE_TABLET | Freq: Every day | ORAL | Status: DC

## 2024-09-08 MED ORDER — PHENOL 1.4 % MT LIQD: 1.0000 | OROMUCOSAL | Status: DC | PRN

## 2024-09-08 MED ORDER — VANCOMYCIN HCL 1000 MG IV SOLR
INTRAVENOUS | Status: AC
  Filled 2024-09-08: qty 20

## 2024-09-08 MED ORDER — PROPOFOL 10 MG/ML IV BOLUS
INTRAVENOUS | Status: AC
  Filled 2024-09-08: qty 20

## 2024-09-08 MED ORDER — PHENYLEPHRINE HCL-NACL 20-0.9 MG/250ML-% IV SOLN
INTRAVENOUS | Status: DC | PRN
  Administered 2024-09-08: 30 ug/min via INTRAVENOUS

## 2024-09-08 MED ORDER — MIDAZOLAM HCL 2 MG/2ML IJ SOLN
INTRAMUSCULAR | Status: AC
  Filled 2024-09-08: qty 2

## 2024-09-08 MED ORDER — CEFAZOLIN SODIUM-DEXTROSE 2-4 GM/100ML-% IV SOLN
2.0000 g | INTRAVENOUS | Status: AC
  Administered 2024-09-08: 2 g via INTRAVENOUS
  Filled 2024-09-08: qty 100

## 2024-09-08 MED ORDER — BUPIVACAINE HCL (PF) 0.25 % IJ SOLN
INTRAMUSCULAR | Status: AC
Start: 2024-09-08 — End: 2024-09-08
  Filled 2024-09-08: qty 30

## 2024-09-08 MED ORDER — CLONIDINE HCL (ANALGESIA) 100 MCG/ML EP SOLN
EPIDURAL | Status: AC
  Filled 2024-09-08: qty 10

## 2024-09-08 MED ORDER — HYDROMORPHONE HCL 1 MG/ML IJ SOLN: 0.5000 mg | INTRAMUSCULAR | Status: DC | PRN

## 2024-09-08 MED ORDER — DEXAMETHASONE SOD PHOSPHATE PF 10 MG/ML IJ SOLN
INTRAMUSCULAR | Status: DC | PRN
  Administered 2024-09-08: 5 mg via INTRAVENOUS

## 2024-09-08 MED ORDER — METHOCARBAMOL 1000 MG/10ML IJ SOLN: 500.0000 mg | Freq: Four times a day (QID) | INTRAMUSCULAR | Status: DC | PRN

## 2024-09-08 MED ORDER — EZETIMIBE 10 MG PO TABS
10.0000 mg | ORAL_TABLET | Freq: Every day | ORAL | Status: DC
  Filled 2024-09-08: qty 1

## 2024-09-08 MED ORDER — GABAPENTIN 100 MG PO CAPS
100.0000 mg | ORAL_CAPSULE | Freq: Two times a day (BID) | ORAL | Status: DC
  Administered 2024-09-08: 100 mg via ORAL
  Filled 2024-09-08: qty 1

## 2024-09-08 MED ORDER — MORPHINE SULFATE (PF) 4 MG/ML IV SOLN
INTRAVENOUS | Status: AC
  Filled 2024-09-08: qty 2

## 2024-09-08 MED ORDER — MIDAZOLAM HCL 2 MG/2ML IJ SOLN
INTRAMUSCULAR | Status: DC | PRN
  Administered 2024-09-08: 1 mg via INTRAVENOUS

## 2024-09-08 MED ORDER — FENTANYL CITRATE (PF) 100 MCG/2ML IJ SOLN: 25.0000 ug | INTRAMUSCULAR | Status: DC | PRN

## 2024-09-08 MED ORDER — VANCOMYCIN HCL 1000 MG IV SOLR
INTRAVENOUS | Status: DC | PRN
  Administered 2024-09-08: 1000 mg

## 2024-09-08 MED ORDER — LOSARTAN POTASSIUM 50 MG PO TABS
50.0000 mg | ORAL_TABLET | Freq: Every morning | ORAL | Status: DC
  Administered 2024-09-08 – 2024-09-09 (×2): 50 mg via ORAL
  Filled 2024-09-08 (×2): qty 1

## 2024-09-08 MED ORDER — LACTATED RINGERS IV SOLN: INTRAVENOUS | Status: DC

## 2024-09-08 MED ORDER — SUGAMMADEX SODIUM 200 MG/2ML IV SOLN
INTRAVENOUS | Status: DC | PRN
  Administered 2024-09-08: 200 mg via INTRAVENOUS

## 2024-09-08 MED ORDER — PROPOFOL 10 MG/ML IV BOLUS
INTRAVENOUS | Status: DC | PRN
  Administered 2024-09-08: 50 mg via INTRAVENOUS
  Administered 2024-09-08: 110 mg via INTRAVENOUS

## 2024-09-08 MED ORDER — MENTHOL 3 MG MT LOZG: 1.0000 | LOZENGE | OROMUCOSAL | Status: DC | PRN

## 2024-09-08 MED ORDER — OXYCODONE HCL 5 MG PO TABS: 5.0000 mg | ORAL_TABLET | Freq: Once | ORAL | Status: DC | PRN

## 2024-09-08 MED ORDER — ONDANSETRON HCL 4 MG/2ML IJ SOLN
INTRAMUSCULAR | Status: DC | PRN
  Administered 2024-09-08: 4 mg via INTRAVENOUS

## 2024-09-08 MED ORDER — 0.9 % SODIUM CHLORIDE (POUR BTL) OPTIME
TOPICAL | Status: DC | PRN
  Administered 2024-09-08 (×4): 1000 mL

## 2024-09-08 MED ORDER — POVIDONE-IODINE 7.5 % EX SOLN
Freq: Once | CUTANEOUS | Status: DC
  Filled 2024-09-08: qty 118

## 2024-09-08 MED ORDER — ROCURONIUM BROMIDE 10 MG/ML (PF) SYRINGE
PREFILLED_SYRINGE | INTRAVENOUS | Status: DC | PRN
  Administered 2024-09-08: 50 mg via INTRAVENOUS

## 2024-09-08 MED ORDER — POVIDONE-IODINE 10 % EX SWAB
2.0000 | Freq: Once | CUTANEOUS | Status: AC
  Administered 2024-09-08: 2 via TOPICAL

## 2024-09-08 MED ORDER — BUSPIRONE HCL 15 MG PO TABS
7.5000 mg | ORAL_TABLET | Freq: Two times a day (BID) | ORAL | Status: DC
  Administered 2024-09-08: 7.5 mg via ORAL
  Filled 2024-09-08: qty 1

## 2024-09-08 MED ORDER — ACETAMINOPHEN 500 MG PO TABS
1000.0000 mg | ORAL_TABLET | Freq: Four times a day (QID) | ORAL | Status: DC
  Administered 2024-09-08 – 2024-09-09 (×3): 1000 mg via ORAL
  Filled 2024-09-08 (×3): qty 2

## 2024-09-08 MED ORDER — ACETAMINOPHEN 10 MG/ML IV SOLN: 1000.0000 mg | Freq: Once | INTRAVENOUS | Status: DC | PRN

## 2024-09-08 MED ORDER — DROPERIDOL 2.5 MG/ML IJ SOLN
0.6250 mg | Freq: Once | INTRAMUSCULAR | Status: AC | PRN
  Administered 2024-09-08: 0.625 mg via INTRAVENOUS

## 2024-09-08 MED ORDER — LORATADINE 10 MG PO TABS: 10.0000 mg | ORAL_TABLET | Freq: Every day | ORAL | Status: DC

## 2024-09-08 MED ORDER — CHLORHEXIDINE GLUCONATE 0.12 % MT SOLN
15.0000 mL | Freq: Once | OROMUCOSAL | Status: AC
  Administered 2024-09-08: 15 mL via OROMUCOSAL
  Filled 2024-09-08: qty 15

## 2024-09-08 SURGICAL SUPPLY — 60 items
ALCOHOL 70% 16 OZ (MISCELLANEOUS) ×2 IMPLANT
AUGMENT COMP REV MI TAPR ADPTR (Joint) IMPLANT
BAG COUNTER SPONGE SURGICOUNT (BAG) ×2 IMPLANT
BIT DRILL 2.7 W/STOP DISP (BIT) IMPLANT
BIT DRILL TWIST 2.7 (BIT) IMPLANT
BLADE SAW SGTL 13X75X1.27 (BLADE) ×2 IMPLANT
CHLORAPREP W/TINT 26 (MISCELLANEOUS) ×2 IMPLANT
COOLER ICEMAN CLASSIC (MISCELLANEOUS) ×2 IMPLANT
COVER SURGICAL LIGHT HANDLE (MISCELLANEOUS) ×2 IMPLANT
DRAPE INCISE IOBAN 66X45 STRL (DRAPES) ×2 IMPLANT
DRAPE SURG ORHT 6 SPLT 77X108 (DRAPES) ×4 IMPLANT
DRAPE U-SHAPE 47X51 STRL (DRAPES) ×4 IMPLANT
DRSG AQUACEL AG ADV 3.5X10 (GAUZE/BANDAGES/DRESSINGS) ×2 IMPLANT
ELECTRODE BLDE 4.0 EZ CLN MEGD (MISCELLANEOUS) ×2 IMPLANT
ELECTRODE REM PT RTRN 9FT ADLT (ELECTROSURGICAL) ×2 IMPLANT
GAUZE SPONGE 4X4 12PLY STRL LF (GAUZE/BANDAGES/DRESSINGS) ×2 IMPLANT
GLENOID SPHERE 36+6 (Joint) IMPLANT
GLOVE BIOGEL PI IND STRL 6.5 (GLOVE) ×2 IMPLANT
GLOVE BIOGEL PI IND STRL 8 (GLOVE) ×2 IMPLANT
GLOVE ECLIPSE 6.5 STRL STRAW (GLOVE) ×2 IMPLANT
GLOVE ECLIPSE 8.0 STRL XLNG CF (GLOVE) ×2 IMPLANT
GOWN STRL REUS W/ TWL LRG LVL3 (GOWN DISPOSABLE) ×2 IMPLANT
GOWN STRL REUS W/ TWL XL LVL3 (GOWN DISPOSABLE) ×2 IMPLANT
GUIDE BONE RSA SHLD ROT RT (ORTHOPEDIC DISPOSABLE SUPPLIES) IMPLANT
HYDROGEN PEROXIDE 16OZ (MISCELLANEOUS) ×2 IMPLANT
KIT BASIN OR (CUSTOM PROCEDURE TRAY) ×2 IMPLANT
KIT TURNOVER KIT B (KITS) ×2 IMPLANT
LAVAGE JET IRRISEPT WOUND (IRRIGATION / IRRIGATOR) ×2 IMPLANT
MANIFOLD NEPTUNE II (INSTRUMENTS) ×2 IMPLANT
NDL SUT 6 .5 CRC .975X.05 MAYO (NEEDLE) IMPLANT
PACK SHOULDER (CUSTOM PROCEDURE TRAY) ×2 IMPLANT
PAD COLD SHLDR WRAP-ON (PAD) ×2 IMPLANT
PIN STEINMANN THREADED TIP (PIN) IMPLANT
PIN THREADED REVERSE (PIN) IMPLANT
REAMER GUIDE BUSHING SURG DISP (MISCELLANEOUS) IMPLANT
RESTRAINT HEAD UNIVERSAL NS (MISCELLANEOUS) ×2 IMPLANT
RETRIEVER SUT HEWSON (MISCELLANEOUS) ×2 IMPLANT
SCREW BONE LOCKING 4.75X30X3.5 (Screw) IMPLANT
SCREW BONE STRL 6.5MMX30MM (Screw) IMPLANT
SCREW LOCKING 4.75MMX15MM (Screw) IMPLANT
SLING ARM IMMOBILIZER LRG (SOFTGOODS) ×2 IMPLANT
SOL PREP POV-IOD 4OZ 10% (MISCELLANEOUS) ×2 IMPLANT
SOLN 0.9% NACL 1000 ML (IV SOLUTION) ×1 IMPLANT
SOLN 0.9% NACL POUR BTL 1000ML (IV SOLUTION) ×2 IMPLANT
SOLN STERILE WATER 1000 ML (IV SOLUTION) ×1 IMPLANT
SOLN STERILE WATER BTL 1000 ML (IV SOLUTION) ×2 IMPLANT
SPONGE T-LAP 18X18 ~~LOC~~+RFID (SPONGE) ×2 IMPLANT
STEM HUMERAL STRL 13MMX83MM (Stem) IMPLANT
STRIP CLOSURE SKIN 1/2X4 (GAUZE/BANDAGES/DRESSINGS) ×2 IMPLANT
SUCTION TUBE FRAZIER 10FR DISP (SUCTIONS) ×2 IMPLANT
SUT BROADBAND TAPE 2PK 1.5 (SUTURE) IMPLANT
SUT MNCRL AB 3-0 PS2 18 (SUTURE) ×2 IMPLANT
SUT SILK 2 0 TIES 10X30 (SUTURE) ×2 IMPLANT
SUT VIC AB 0 CT1 27XBRD ANBCTR (SUTURE) ×8 IMPLANT
SUT VIC AB 1 CT1 27XBRD ANBCTR (SUTURE) ×4 IMPLANT
SUT VIC AB 2-0 CT2 27 (SUTURE) ×6 IMPLANT
SUT VICRYL 0 UR6 27IN ABS (SUTURE) ×4 IMPLANT
TOWEL GREEN STERILE (TOWEL DISPOSABLE) ×2 IMPLANT
TRAY HUM REV SHOULDER 36 +3 (Shoulder) IMPLANT
TRAY HUM REV SHOULDER STD +6 (Shoulder) IMPLANT

## 2024-09-08 NOTE — Op Note (Unsigned)
 NAME: Bianca Ware, Bianca K. MEDICAL RECORD NO: [id number] ACCOUNT NO: [id number] DATE OF BIRTH: 1946-06-09 FACILITY: [facility] LOCATION: [facility] PHYSICIAN: Cordella RAMAN. Addie, MD  Operative Report   DATE OF PROCEDURE: 09/08/2024  PREOPERATIVE DIAGNOSIS: Right shoulder arthritis.  POSTOPERATIVE DIAGNOSIS: Right shoulder arthritis.  PROCEDURE: Right reverse shoulder replacement using Biomet components, 36 +6 glenosphere with small augmented baseplate, one central compression screw, four peripheral locking screws with mini humeral stem size 13 with mini standard thickness +6 tapered  offset humeral tray with 36 +3 thickness polyethylene bearing.  SURGEON: Cordella RAMAN. Addie, MD  ASSISTANT: Herlene Calix, PA  INDICATIONS: The patient is a 78 year old patient with end-stage right shoulder arthritis who presents for operative management after explanation of the risks and benefits.  DESCRIPTION OF PROCEDURE: The patient was brought to the operating room where general anesthetic was induced.  Preoperative antibiotics were administered.  Timeout was called. The patient was placed in the beach chair position with the head in neutral  position. Right shoulder, arm and hand then prescrubbed with alcohol  and Betadine , allowed to air dry, prepped with DuraPrep solution, and draped in a sterile manner. Ioban was used to cover the operative field.  After calling timeout, deltopectoral approach was made. Skin and subcutaneous tissue were sharply divided. Irrisept solution was utilized. The cephalic vein was mobilized laterally. The upper 1.5 to 2 cm of the pec was released.  The biceps tendon was then tenodesed to the surrounding tissue around the pec including some of the pec. This was done with 5-0 Vicryl sutures.  Next, the subdeltoid and subacromial space were manually released from adhesions.  Next, the axillary nerve was palpated and protected at all times during the case.  Kolbel retractors were  placed.  The biceps tendon was then taken out of the groove proximal to the tenodesis site and then the rotator interval was opened up to the base of the coracoid. Biceps tendon was tagged in this manner. Circumflex vessels were then ligated. Subscapularis was  then detached from the lesser tuberosity using a #15 blade. Electrocautery was then used to carry that dissection down about 2 cm onto the medial humeral neck staying on bone. Cobb elevator with a sponge then used to take that dissection around to the 5  o'clock position on the humeral head. Osteophytes were removed.  Next, the head was then reamed up to a size 13 and the head was cut in 30 degrees of retroversion at the medial aspect of the lesser tuberosity. The shoulder was then broached up to a size 13 and a 38-mm cap was placed. Posterior retractor was placed.  Anterior retractor was then placed. With the patient's muscle relaxer reversed, the labrum was excised from the glenoid circumferentially. There was some calcified labral tissue at the posterosuperior aspect of the shoulder.  Next, the patient's specific guide was placed. Guide pins were placed and then reaming was performed in accordance with preoperative templating. Superior reaming was then performed for the small augment. Trial fit nicely within the prepared glenoid,  which had very solid bone.  Next, the true baseplate was placed after irrigating with Irrisept solution. One central compression screw with excellent fixation was placed along with four locking screws.  Next, trial reduction was performed and the most stable construct was 36 +3 glenosphere with a +6 tapered tray and +3 liner. This gave excellent stability to adduction, extension, and a forward force along with internal and external rotation at 90  degrees of abduction. Difficult to reduce and  difficult to re-dislocate. Two fingers tight.  The components were then dislocated. The trial stem was removed. Thorough  irrigation was performed and then Irrisept solution was used to irrigate out the humeral canal. 6 tapes were placed through drill holes through the lesser tuberosity. At this time,  the trial glenosphere was removed and the true glenosphere was placed with 6 mm offset in neutral alignment. Good Morse taper fit was obtained.  Next, the humeral canal was suctioned. vancomycin powder placed. The stem was placed and then trial reduction was performed with the +6 humeral tray with +3 liner. That gave same stability parameters. The true humeral tray and liner was then placed with  excellent stability achieved. Axillary nerve again palpated and intact. Thorough irrigation with 3 liters of pouring irrigation was utilized. The arm was then taken to 30 degrees of external rotation and the subscapularis was then repaired using NICE  knots back to the lesser tuberosity. At the conclusion of the repair, we were able to externally rotate the patient to about 45-50 degrees.  Next, thorough irrigation with Irrisept solution placed on the implant through the rotator interval. vancomycin powder was then placed and the rotator interval was closed with the arm in 30 degrees of external rotation using #1 Vicryl suture. Irrisept  solution was again utilized in the deltopectoral interval and vancomycin powder was placed in this interval. The deltopectoral interval was then closed using #1 Vicryl suture followed by interrupted inverted #0 Vicryl suture, 2-0 Vicryl suture, and 3-0  Monocryl with Steri-Strips, Aquacel dressing, and shoulder immobilizer placed.  The patient tolerated the procedure well without immediate complications.  Luke's assistance was required at all times for retraction, opening and closing, mobilization of tissue. His assistance was of medical necessity.      Amey.Baba D: 09/08/2024 10:56:45 am T: 09/08/2024 11:21:00 am  JOB: [id number]/ [id number]

## 2024-09-08 NOTE — H&P (Signed)
 Bianca Ware is an 78 y.o. female.   Chief Complaint: right shoulder pain HPI: Bianca Ware is a 78 y.o. female who presents  reporting continued right shoulder pain.  Injection done 06/08/2024 which helped some but the pain has come back.  She is right-hand dominant.  She does report some decreased grip strength as well as recurrent shoulder pain.  Hard for her to drive and hard for her to dry herself.  She really cannot push the sharpening cart.  Hard for her to change the sheets on her bed.  The pain does wake her from sleep at night on a regular basis.  She does have end-stage arthritis in the shoulder joint..   Past Medical History:  Diagnosis Date  . Aortic insufficiency 2024  . Arthritis    hands  . Complication of anesthesia    waking up  . Elevated coronary artery calcium  score   . OA (osteoarthritis) of knee    right and left  . Pneumonia   . PONV (postoperative nausea and vomiting)   . Seasonal allergies   . Wears glasses     Past Surgical History:  Procedure Laterality Date  . ABDOMINAL HYSTERECTOMY  1970s  . BLADDER SUSPENSION  2002   w/ urethral sling  . CATARACT EXTRACTION W/ INTRAOCULAR LENS IMPLANT Bilateral 2018  . CESAREAN SECTION  x2   yrs ago  . DILATION AND CURETTAGE OF UTERUS  1960s  . LUMBAR LAMINECTOMY N/A 05/29/2015   Procedure: LEFT L4-5 MICRODISCECTOMY;  Surgeon: Lynwood FORBES Better, MD;  Location: [facility];  Service: Orthopedics;  Laterality: N/A;  . TONSILLECTOMY  child  . TOTAL HIP ARTHROPLASTY Left 09/07/2015   Procedure: LEFT TOTAL HIP ARTHROPLASTY ANTERIOR APPROACH;  Surgeon: Lonni CINDERELLA Poli, MD;  Location: [facility];  Service: Orthopedics;  Laterality: Left;  . TOTAL HIP ARTHROPLASTY Right 07/24/2023   Procedure: RIGHT TOTAL HIP ARTHROPLASTY ANTERIOR APPROACH;  Surgeon: Poli Lonni CINDERELLA, MD;  Location: [facility];  Service: Orthopedics;  Laterality: Right;  . TOTAL KNEE ARTHROPLASTY Right 10/01/2018   Procedure: RIGHT TOTAL KNEE  ARTHROPLASTY;  Surgeon: Poli Lonni CINDERELLA, MD;  Location: [facility];  Service: Orthopedics;  Laterality: Right;  . TUBAL LIGATION Bilateral yrs ago    Family History  Problem Relation Age of Onset  . Arrhythmia Mother        pacemaker  . Breast cancer Sister   . Heart attack Brother    Social History:  reports that she quit smoking about 47 years ago. Her smoking use included cigarettes. She started smoking about 62 years ago. She has a 3.8 pack-year smoking history. She has never used smokeless tobacco. She reports that she does not drink alcohol  and does not use drugs.  Allergies: No Known Allergies  Medications Prior to Admission  Medication Sig Dispense Refill  . acetaminophen  (TYLENOL ) 650 MG CR tablet Take 1,300 mg by mouth in the morning and at bedtime.    . Ascorbic Acid  (VITAMIN C ) 1000 MG tablet Take 1,000 mg by mouth every morning.    . busPIRone  (BUSPAR ) 7.5 MG tablet Take 7.5 mg by mouth 2 (two) times daily.    . Calcium  Carbonate-Vitamin D  (CALCIUM  + D PO) Take 1 tablet by mouth every morning.    . cetirizine (ZYRTEC) 10 MG tablet Take 10 mg by mouth in the morning.    . Cholecalciferol  (VITAMIN D ) 2000 units tablet Take 2,000 Units by mouth in the morning.    SABRA estradiol (ESTRACE) 0.1  MG/GM vaginal cream Place 1 Applicatorful vaginally 3 (three) times a week.    . Evolocumab  (REPATHA  SURECLICK) 140 MG/ML SOAJ Inject 140 mg into the skin every 14 (fourteen) days. 6 mL 3  . ezetimibe  (ZETIA ) 10 MG tablet Take 10 mg by mouth daily with supper.    . losartan (COZAAR) 50 MG tablet Take 50 mg by mouth in the morning.    . Multiple Vitamins-Minerals (HAIR SKIN NAILS PO) Take 1 tablet by mouth in the morning, at noon, and at bedtime.    . Nutritional Supplements (ESTROVEN PO) Take 1 tablet by mouth in the morning and at bedtime.    . Omega-3 1000 MG CAPS Take 1,000 mg by mouth 3 (three) times daily.    SABRA omeprazole (PRILOSEC) 20 MG capsule Take 20 mg by mouth 2 (two) times  daily before a meal. Before breakfast & before supper    . Probiotic Product (PROBIOTIC PO) Take 1 capsule by mouth in the morning.    . rosuvastatin  (CRESTOR ) 40 MG tablet Take 40 mg by mouth daily with supper.    . traZODone  (DESYREL ) 50 MG tablet Take 50 mg by mouth at bedtime.    SABRA albuterol (VENTOLIN HFA) 108 (90 Base) MCG/ACT inhaler Inhale 2 puffs into the lungs every 6 (six) hours as needed for shortness of breath or wheezing.    . aspirin  EC 81 MG tablet Take 81 mg by mouth in the morning. Swallow whole.    . gabapentin  (NEURONTIN ) 100 MG capsule Take 1 capsule (100 mg total) by mouth 2 (two) times daily. 60 capsule 0  . methocarbamol  (ROBAXIN ) 500 MG tablet Take 1 tablet (500 mg total) by mouth every 8 (eight) hours as needed for muscle spasms. 30 tablet 0  . oxyCODONE  (OXY IR/ROXICODONE ) 5 MG immediate release tablet Take 1-2 tablets (5-10 mg total) by mouth every 6 (six) hours as needed for moderate pain (pain score 4-6). 30 tablet 0    No results found for this or any previous visit (from the past 48 hours). No results found.  Review of Systems  Musculoskeletal:  Positive for arthralgias.  All other systems reviewed and are negative.   Blood pressure (!) 153/65, pulse 66, temperature 98.3 F (36.8 C), temperature source Oral, resp. rate 18, height 5' 7 (1.702 m), weight 81.6 kg, SpO2 99%. Physical Exam Vitals reviewed.  HENT:     Head: Normocephalic.     Nose: Nose normal.     Mouth/Throat:     Mouth: Mucous membranes are moist.  Eyes:     Pupils: Pupils are equal, round, and reactive to light.  Cardiovascular:     Rate and Rhythm: Normal rate.     Pulses: Normal pulses.  Pulmonary:     Effort: Pulmonary effort is normal.  Abdominal:     General: Abdomen is flat.  Musculoskeletal:     Cervical back: Normal range of motion.  Skin:    Capillary Refill: Capillary refill takes less than 2 seconds.  Neurological:     General: No focal deficit present.     Mental  Status: She is alert.  Psychiatric:        Mood and Affect: Mood normal.    Ortho exam demonstrates functional deltoid. Cervical spine range of motion intact. Patient has good wrist flexion extension biceps triceps and deltoid strength. A lot of pain with passive range of motion of that right shoulder with expected amount of coarseness and grinding present. Range of motion is  essentially the same at 10/60/85 with pretty reasonable rotator cuff strength to internal and external rotation strength testing at 15 degrees of abduction. Range of motion beyond her passive range of motion is painful. Deltoid fires nicely.  Assessment/Plan Impression is end-stage right shoulder arthritis with failure of conservative treatment. Injection in mid July puts her potential surgery date off until mid October. The risks and benefits of reverse shoulder replacement are discussed with the patient including not limited to infection nerve vessel damage incomplete pain relief as well as incomplete functional restoration and instability. Expected  rehab progression is also discussed. Overall based on the amount of disability and pain [name] is having I think it would be a good operation for her.  CT scan performed for patient specific instrumentation to optimize implant position and implant longevity. I will send in her postoperative medication regimen at this time so she will have them before her surgery. All questions answered.   KANDICE Glendia Hutchinson, MD 09/08/2024, 6:33 AM

## 2024-09-08 NOTE — Progress Notes (Signed)
 U/A shows rare bacteria. Dr. Addie is aware

## 2024-09-08 NOTE — Anesthesia Procedure Notes (Signed)
 Procedure Name: Intubation Date/Time: 09/08/2024 7:58 AM  Performed by: Marva Lonni PARAS, CRNAPre-anesthesia Checklist: Patient identified, Emergency Drugs available, Suction available and Patient being monitored Patient Re-evaluated:Patient Re-evaluated prior to induction Oxygen Delivery Method: Circle System Utilized Preoxygenation: Pre-oxygenation with 100% oxygen Induction Type: IV induction Ventilation: Mask ventilation without difficulty Laryngoscope Size: Glidescope and 3 Grade View: Grade I Tube type: Oral Tube size: 7.0 mm Number of attempts: 2 Airway Equipment and Method: Rigid stylet and Video-laryngoscopy Placement Confirmation: ETT inserted through vocal cords under direct vision, positive ETCO2 and breath sounds checked- equal and bilateral Secured at: 21 cm Tube secured with: Tape Dental Injury: Teeth and Oropharynx as per pre-operative assessment  Comments: 1st attempt by CRNA unsuccessful -- MAC 3 with Grade 3 view (anterior airway)  2nd attempt by CRNA with Glidescope S3 blade successful -- Grade I view   Patient stable throughout induction sequence

## 2024-09-08 NOTE — Anesthesia Procedure Notes (Signed)
 Anesthesia Regional Block: Interscalene brachial plexus block   Pre-Anesthetic Checklist: , timeout performed,  Correct Patient, Correct Site, Correct Laterality,  Correct Procedure, Correct Position, site marked,  Risks and benefits discussed,  Surgical consent,  Pre-op evaluation,  At surgeon's request and post-op pain management  Laterality: Right and Upper  Prep: chloraprep       Needles:  Injection technique: Single-shot      Needle Length: 5cm  Needle Gauge: 22     Additional Needles: Arrow StimuQuik ECHO Echogenic Stimulating PNB Needle  Procedures:,,,, ultrasound used (permanent image in chart),,    Narrative:  Start time: 09/08/2024 7:18 AM End time: 09/08/2024 7:24 AM Injection made incrementally with aspirations every 5 mL.  Performed by: Personally  Anesthesiologist: Leopoldo Bruckner, MD

## 2024-09-08 NOTE — Anesthesia Postprocedure Evaluation (Signed)
 Anesthesia Post Note  Patient: Bianca Ware  Procedure(s) Performed: RIGHT REVERSE SHOULDER ARTHROPLASTY (Right: Shoulder)     Patient location during evaluation: PACU Anesthesia Type: Regional and General Level of consciousness: awake and alert Pain management: pain level controlled Vital Signs Assessment: post-procedure vital signs reviewed and stable Respiratory status: spontaneous breathing, nonlabored ventilation and respiratory function stable Cardiovascular status: blood pressure returned to baseline and stable Postop Assessment: no apparent nausea or vomiting Anesthetic complications: no   No notable events documented.  Last Vitals:  Vitals:   09/08/24 1245 09/08/24 1309  BP: (!) 153/59 133/71  Pulse: 69 76  Resp: 18 18  Temp: (!) 36.3 C 36.7 C  SpO2: 93% 94%    Last Pain:  Vitals:   09/08/24 1315  TempSrc:   PainSc: 0-No pain                 Dajsha Massaro

## 2024-09-08 NOTE — Brief Op Note (Signed)
   09/08/2024  10:48 AM  PATIENT:  Zebedee MARLA Calin  78 y.o. female  PRE-OPERATIVE DIAGNOSIS:  right shoulder osteoarthritis  POST-OPERATIVE DIAGNOSIS:  right shoulder osteoarthritis  PROCEDURE:  Procedure(s): RIGHT REVERSE SHOULDER ARTHROPLASTY  SURGEON:  Surgeon(s): Addie Cordella Hamilton, MD  ASSISTANT: magnant pa  ANESTHESIA:   general  EBL: 50 ml    Total I/O In: 900 [I.V.:800; IV Piggyback:100] Out: 100 [Blood:100]  BLOOD ADMINISTERED: none  DRAINS: none   LOCAL MEDICATIONS USED:  vanco  SPECIMEN:  No Specimen  COUNTS:  YES  TOURNIQUET:  * No tourniquets in log *  DICTATION: .Other Dictation: Dictation Number [id number]  PLAN OF CARE: Admit for overnight observation  PATIENT DISPOSITION:  PACU - hemodynamically stable

## 2024-09-08 NOTE — Transfer of Care (Signed)
 Immediate Anesthesia Transfer of Care Note  Patient: Bianca Ware  Procedure(s) Performed: RIGHT REVERSE SHOULDER ARTHROPLASTY (Right: Shoulder)  Patient Location: PACU  Anesthesia Type:General and Regional  Level of Consciousness: awake, alert , and patient cooperative  Airway & Oxygen Therapy: Patient Spontanous Breathing and Patient connected to face mask oxygen  Post-op Assessment: Report given to RN, Post -op Vital signs reviewed and stable, and Patient moving all extremities X 4  Post vital signs: Reviewed and stable  Last Vitals:  Vitals Value Taken Time  BP 126/46 09/08/24 11:05  Temp 36.2 C 09/08/24 11:05  Pulse 71 09/08/24 11:07  Resp 18 09/08/24 11:07  SpO2 100 % 09/08/24 11:07  Vitals shown include unfiled device data.  Last Pain:  Vitals:   09/08/24 0607  TempSrc: Oral  PainSc: 4          Complications: No notable events documented.

## 2024-09-08 NOTE — Op Note (Signed)
   The note originally documented on this encounter has been moved the the encounter in which it belongs.

## 2024-09-09 ENCOUNTER — Encounter (HOSPITAL_COMMUNITY): Payer: Self-pay | Admitting: Orthopedic Surgery

## 2024-09-09 DIAGNOSIS — Z87891 Personal history of nicotine dependence: Secondary | ICD-10-CM | POA: Diagnosis not present

## 2024-09-09 DIAGNOSIS — I2510 Atherosclerotic heart disease of native coronary artery without angina pectoris: Secondary | ICD-10-CM | POA: Diagnosis not present

## 2024-09-09 DIAGNOSIS — Z96611 Presence of right artificial shoulder joint: Secondary | ICD-10-CM | POA: Diagnosis not present

## 2024-09-09 DIAGNOSIS — M19011 Primary osteoarthritis, right shoulder: Secondary | ICD-10-CM | POA: Diagnosis not present

## 2024-09-09 DIAGNOSIS — Z7982 Long term (current) use of aspirin: Secondary | ICD-10-CM | POA: Diagnosis not present

## 2024-09-09 NOTE — Plan of Care (Signed)
 Pt doing well. Pt and husband given D/C instructions with verbal understanding. Rx's were sent to the pharmacy by MD. Pt's incision is clean and dry with no sign of infection. Pt's IV was removed prior to D/C. Pt D/C'd home via wheelchair per MD order. Pt is stable @ D/C and has no other needs at this time. Rema Fendt, RN

## 2024-09-09 NOTE — TOC Transition Note (Signed)
 Transition of Care [facility]) - Discharge Note   Patient Details  Name: Bianca Ware MRN: [id number] Date of Birth: Oct 16, 1946  [facility] CM/SW Contact:  Andrez JULIANNA George, RN Phone Number: 09/09/2024, 10:08 AM   Clinical Narrative:     Pt discharging home with home health through [facility]. Information on the AVS. [facility] will contact her for the first home visit.  Pt has transportation home.  Final next level of care: Home w Home Health Services Barriers to Discharge: No Barriers Identified   Patient Goals and CMS Choice   CMS Medicare.gov Compare Post Acute Care list provided to:: Patient Choice offered to / list presented to : Patient      Discharge Placement                       Discharge Plan and Services Additional resources added to the After Visit Summary for                            [facility] Arranged: PT, OT [facility] Agency: [facility] Date [facility] Agency Contacted: 09/09/24   Representative spoke with at [facility] Agency: Darleene  Social Drivers of Health (SDOH) Interventions SDOH Screenings   Food Insecurity: No Food Insecurity (07/24/2023)  Housing: Low Risk  (07/24/2023)  Transportation Needs: No Transportation Needs (07/24/2023)  Utilities: Not At Risk (07/24/2023)  Tobacco Use: Medium Risk (09/08/2024)     Readmission Risk Interventions     No data to display

## 2024-09-09 NOTE — Care Management Obs Status (Signed)
 MEDICARE OBSERVATION STATUS NOTIFICATION   Patient Details  Name: KATELEEN ENCARNACION MRN: [id number] Date of Birth: Aug 12, 1946   Medicare Observation Status Notification Given:  Yes    Jon Cruel 09/09/2024, 9:51 AM

## 2024-09-09 NOTE — Evaluation (Signed)
 Occupational Therapy Evaluation Patient Details Name: Bianca Ware MRN: [id number] DOB: 06-03-1946 Today's Date: 09/09/2024   History of Present Illness   78 yo F s/p TSA - Reverse.  PHM includes: TKA, THA.     Clinical Impressions Patient admitted for the procedure above.  PTA she lives at home with her spouse, who can assist as needed.  Patient with R arm block on place with minimal AROM and sensation, needing Min A for ADL and supervision for mobility.  Per orders, shoulder parameters as follows for ADL tasks: ; ER 0-20; No shoulder ROM; elbow/wrist/hand ROM only, pendulums allowed. While moving within specified parameters, pt/caregiver instructed on bathing and how to donn/doff shirt, placing operative arm through sleeve first when donning and off last when doffing.Pt/caregiver educated on compensatory strategies for LB ADL and strategies to reduce risk of falls.  Pt/caregiver educated on donning/doffing sling and to wear the sling at all times with the exception of ADL, and to loosen the neck strap of the sling when the operative arm is in a supported position when sitting. In sitting or supine, pt instructed to have a pillow behind and under their operative arm to provide support. If assist needed with ambulation, caregiver educated on the importance of walking on pt's non-operative side.  Education regarding use of IceMan Cold Therapy completed, including the importance of using a barrier on the shoulder prior to positioning the wrap-on pad. Pt/caregiver verbalized/demonstrated understanding. Teach Back used while caregiver assisted with dressing pt and positioning wrap-on pad to facilitate DC.      If plan is discharge home, recommend the following:   A little help with bathing/dressing/bathroom;A little help with walking and/or transfers;Assist for transportation     Functional Status Assessment   Patient has had a recent decline in their functional status and demonstrates  the ability to make significant improvements in function in a reasonable and predictable amount of time.     Equipment Recommendations   None recommended by OT     Recommendations for Other Services         Precautions/Restrictions   Precautions Precautions: Shoulder Type of Shoulder Precautions: No AROM, ER to 30 degrees.  Sling off for ADL and HEP Shoulder Interventions: Shoulder sling/immobilizer;Off for dressing/bathing/exercises Precaution Booklet Issued: Yes (comment) Recall of Precautions/Restrictions: Intact Required Braces or Orthoses: Sling Restrictions Weight Bearing Restrictions Per Provider Order: Yes RUE Weight Bearing Per Provider Order: Non weight bearing     Mobility Bed Mobility Overal bed mobility: Modified Independent                  Transfers Overall transfer level: Needs assistance   Transfers: Sit to/from Stand, Bed to chair/wheelchair/BSC Sit to Stand: Supervision     Step pivot transfers: Supervision            Balance Overall balance assessment: Mild deficits observed, not formally tested                                         ADL either performed or assessed with clinical judgement   ADL                   Upper Body Dressing : Minimal assistance   Lower Body Dressing: Minimal assistance                       Vision  Patient Visual Report: No change from baseline       Perception Perception: Not tested       Praxis Praxis: Not tested       Pertinent Vitals/Pain Pain Assessment Pain Assessment: No/denies pain     Extremity/Trunk Assessment Upper Extremity Assessment Upper Extremity Assessment: Right hand dominant   Lower Extremity Assessment Lower Extremity Assessment: Overall WFL for tasks assessed   Cervical / Trunk Assessment Cervical / Trunk Assessment: Normal   Communication Communication Communication: No apparent difficulties   Cognition Arousal:  Alert Behavior During Therapy: WFL for tasks assessed/performed Cognition: No apparent impairments                               Following commands: Intact       Cueing  General Comments   Cueing Techniques: Verbal cues   VSS on RA   Exercises Shoulder Exercises Pendulum Exercise: AAROM Elbow Flexion: AAROM Elbow Extension: AAROM Wrist Extension: AAROM Digit Composite Flexion: AAROM Composite Extension: AAROM   Shoulder Instructions      Home Living Family/patient expects to be discharged to:: Private residence Living Arrangements: Spouse/significant other Available Help at Discharge: Family Type of Home: House Home Access: Stairs to enter Secretary/administrator of Steps: 4 Entrance Stairs-Rails: Right;Left Home Layout: One level     Bathroom Shower/Tub: Walk-in Human resources officer: Handicapped height Bathroom Accessibility: Yes How Accessible: Accessible via walker Home Equipment: Agricultural consultant (2 wheels);Shower seat;BSC/3in1          Prior Functioning/Environment Prior Level of Function : Independent/Modified Independent;Driving                    OT Problem List: Decreased range of motion   OT Treatment/Interventions:        OT Goals(Current goals can be found in the care plan section)   Acute Rehab OT Goals Patient Stated Goal: Return home OT Goal Formulation: With patient Time For Goal Achievement: 09/12/24 Potential to Achieve Goals: Good   OT Frequency:       Co-evaluation              AM-PAC OT 6 Clicks Daily Activity     Outcome Measure Help from another person eating meals?: A Little Help from another person taking care of personal grooming?: A Little Help from another person toileting, which includes using toliet, bedpan, or urinal?: A Little Help from another person bathing (including washing, rinsing, drying)?: A Little Help from another person to put on and taking off regular upper body  clothing?: A Little Help from another person to put on and taking off regular lower body clothing?: A Little 6 Click Score: 18   End of Session Equipment Utilized During Treatment: Gait belt Nurse Communication: Mobility status  Activity Tolerance: Patient tolerated treatment well Patient left: in bed;with call bell/phone within reach;with family/visitor present  OT Visit Diagnosis: Unsteadiness on feet (R26.81)                Time: 0820-0903 OT Time Calculation (min): 43 min Charges:  OT General Charges $OT Visit: 1 Visit OT Evaluation $OT Eval Moderate Complexity: 1 Mod OT Treatments $Self Care/Home Management : 23-37 mins  09/09/2024  RP, OTR/L  Acute Rehabilitation Services  Office:  [phone]   Bianca Ware 09/09/2024, 9:08 AM

## 2024-09-11 NOTE — Discharge Summary (Signed)
 Physician Discharge Summary      Patient ID: Bianca Ware MRN: [id number] DOB/AGE: 07-19-1946 78 y.o.  Admit date: 09/08/2024 Discharge date: 09/09/2024  Admission Diagnoses:  Principal Problem:   S/P reverse total shoulder arthroplasty, right   Discharge Diagnoses:  Same  Surgeries: Procedure(s): RIGHT REVERSE SHOULDER ARTHROPLASTY on 09/08/2024   Consultants:   Discharged Condition: Stable  Hospital Course: Bianca Ware is an 78 y.o. female who was admitted 09/08/2024 with a chief complaint of right shoulder pain, and found to have a diagnosis of right shoulder arthritis.  They were brought to the operating room on 09/08/2024 and underwent the above named procedures.  Pt awoke from anesthesia without complication and was transferred to the floor. On POD1, patient's pain was controlled.  Interscalene block still in effect.  She was able to ambulate around the unit without difficulty.  No red flag signs or symptoms throughout her stay.  Discharged home on POD 1..  Pt will f/u with Dr. Addie in clinic in ~2 weeks.   Antibiotics given:  Anti-infectives (From admission, onward)    Start     Dose/Rate Route Frequency Ordered Stop   09/08/24 1415  ceFAZolin  (ANCEF ) IVPB 2g/100 mL premix        2 g 200 mL/hr over 30 Minutes Intravenous Every 8 hours 09/08/24 1315 09/09/24 0603   09/08/24 0823  vancomycin (VANCOCIN) powder  Status:  Discontinued          As needed 09/08/24 0823 09/08/24 1102   09/08/24 0600  ceFAZolin  (ANCEF ) IVPB 2g/100 mL premix        2 g 200 mL/hr over 30 Minutes Intravenous On call to O.R. 09/08/24 9449 09/08/24 0810     .  Recent vital signs:  Vitals:   09/09/24 0326 09/09/24 0750  BP: (!) 133/52 (!) 123/52  Pulse: 82 67  Resp: 18 19  Temp: 99 F (37.2 C) 98.4 F (36.9 C)  SpO2: 93% 93%    Recent laboratory studies:  Results for orders placed or performed during the hospital encounter of 09/02/24  Surgical pcr screen   Collection  Time: 09/02/24  2:15 PM   Specimen: Nasal Mucosa; Nasal Swab  Result Value Ref Range   MRSA, PCR NEGATIVE NEGATIVE   Staphylococcus aureus NEGATIVE NEGATIVE  Urinalysis, w/ Reflex to Culture (Infection Suspected) -Urine, Clean Catch   Collection Time: 09/02/24  2:15 PM  Result Value Ref Range   Specimen Source URINE, CLEAN CATCH    Color, Urine YELLOW YELLOW   APPearance HAZY (A) CLEAR   Specific Gravity, Urine 1.011 1.005 - 1.030   pH 5.0 5.0 - 8.0   Glucose, UA NEGATIVE NEGATIVE mg/dL   Hgb urine dipstick NEGATIVE NEGATIVE   Bilirubin Urine NEGATIVE NEGATIVE   Ketones, ur NEGATIVE NEGATIVE mg/dL   Protein, ur NEGATIVE NEGATIVE mg/dL   Nitrite NEGATIVE NEGATIVE   Leukocytes,Ua NEGATIVE NEGATIVE   RBC / HPF 0-5 0 - 5 RBC/hpf   WBC, UA 0-5 0 - 5 WBC/hpf   Bacteria, UA RARE (A) NONE SEEN   Squamous Epithelial / HPF 0-5 0 - 5 /HPF  CBC   Collection Time: 09/02/24  3:00 PM  Result Value Ref Range   WBC 8.3 4.0 - 10.5 K/uL   RBC 4.77 3.87 - 5.11 MIL/uL   Hemoglobin 14.1 12.0 - 15.0 g/dL   HCT 56.4 63.9 - 53.9 %   MCV 91.2 80.0 - 100.0 fL   MCH 29.6 26.0 - 34.0 pg  MCHC 32.4 30.0 - 36.0 g/dL   RDW 86.7 88.4 - 84.4 %   Platelets 281 150 - 400 K/uL   nRBC 0.0 0.0 - 0.2 %  Basic metabolic panel   Collection Time: 09/02/24  3:00 PM  Result Value Ref Range   Sodium 139 135 - 145 mmol/L   Potassium 3.9 3.5 - 5.1 mmol/L   Chloride 103 98 - 111 mmol/L   CO2 27 22 - 32 mmol/L   Glucose, Bld 129 (H) 70 - 99 mg/dL   BUN 10 8 - 23 mg/dL   Creatinine, Ser 9.32 0.44 - 1.00 mg/dL   Calcium  9.1 8.9 - 10.3 mg/dL   GFR, Estimated >39 >39 mL/min   Anion gap 9 5 - 15    Discharge Medications:   Allergies as of 09/09/2024   No Known Allergies      Medication List     TAKE these medications    acetaminophen  650 MG CR tablet Commonly known as: TYLENOL  Take 1,300 mg by mouth in the morning and at bedtime.   albuterol 108 (90 Base) MCG/ACT inhaler Commonly known as: VENTOLIN  HFA Inhale 2 puffs into the lungs every 6 (six) hours as needed for shortness of breath or wheezing.   aspirin  EC 81 MG tablet Take 81 mg by mouth in the morning. Swallow whole.   busPIRone  7.5 MG tablet Commonly known as: BUSPAR  Take 7.5 mg by mouth 2 (two) times daily.   CALCIUM  + D PO Take 1 tablet by mouth every morning.   cetirizine 10 MG tablet Commonly known as: ZYRTEC Take 10 mg by mouth in the morning.   estradiol 0.1 MG/GM vaginal cream Commonly known as: ESTRACE Place 1 Applicatorful vaginally 3 (three) times a week.   ESTROVEN PO Take 1 tablet by mouth in the morning and at bedtime.   ezetimibe  10 MG tablet Commonly known as: ZETIA  Take 10 mg by mouth daily with supper.   gabapentin  100 MG capsule Commonly known as: NEURONTIN  Take 1 capsule (100 mg total) by mouth 2 (two) times daily.   HAIR SKIN NAILS PO Take 1 tablet by mouth in the morning, at noon, and at bedtime.   losartan 50 MG tablet Commonly known as: COZAAR Take 50 mg by mouth in the morning.   methocarbamol  500 MG tablet Commonly known as: ROBAXIN  Take 1 tablet (500 mg total) by mouth every 8 (eight) hours as needed for muscle spasms.   Omega-3 1000 MG Caps Take 1,000 mg by mouth 3 (three) times daily.   omeprazole 20 MG capsule Commonly known as: PRILOSEC Take 20 mg by mouth 2 (two) times daily before a meal. Before breakfast & before supper   oxyCODONE  5 MG immediate release tablet Commonly known as: Oxy IR/ROXICODONE  Take 1-2 tablets (5-10 mg total) by mouth every 6 (six) hours as needed for moderate pain (pain score 4-6).   PROBIOTIC PO Take 1 capsule by mouth in the morning.   Repatha  SureClick 140 MG/ML Soaj Generic drug: Evolocumab  Inject 140 mg into the skin every 14 (fourteen) days.   rosuvastatin  40 MG tablet Commonly known as: CRESTOR  Take 40 mg by mouth daily with supper.   traZODone  50 MG tablet Commonly known as: DESYREL  Take 50 mg by mouth at bedtime.   vitamin  C 1000 MG tablet Take 1,000 mg by mouth every morning.   Vitamin D  50 MCG (2000 UT) tablet Take 2,000 Units by mouth in the morning.  Diagnostic Studies: DG Shoulder Right Port Result Date: 09/08/2024 CLINICAL DATA:  Status post right shoulder replacement EXAM: RIGHT SHOULDER - 1 VIEW COMPARISON:  None Available. FINDINGS: New right shoulder arthroplasty is noted. No acute fracture or dislocation is seen. No soft tissue abnormality is noted. IMPRESSION: Status post right shoulder arthroplasty Electronically Signed   By: Oneil Devonshire M.D.   On: 09/08/2024 15:07    Disposition: Discharge disposition: 01-Home or Self Care       Discharge Instructions     Call MD / Call 911   Complete by: As directed    If you experience chest pain or shortness of breath, CALL 911 and be transported to the hospital emergency room.  If you develope a fever above 101 F, pus (white drainage) or increased drainage or redness at the wound, or calf pain, call your surgeon's office.   Constipation Prevention   Complete by: As directed    Drink plenty of fluids.  Prune juice may be helpful.  You may use a stool softener, such as Colace (over the counter) 100 mg twice a day.  Use MiraLax  (over the counter) for constipation as needed.   Diet - low sodium heart healthy   Complete by: As directed    Discharge instructions   Complete by: As directed    You may shower, dressing is waterproof.  Do not bathe or soak the operative shoulder in a tub, pool.  Continue PT exercises as learned in the hospital and as demonstrated by home health therapist.  No lifting with the operative shoulder. Continue use of the sling.  Follow-up with Dr. Addie in ~2 weeks on your given appointment date.  We will remove your adhesive bandage at that time.    Dental Antibiotics:  In most cases prophylactic antibiotics for Dental procdeures after total joint surgery are not necessary.  Exceptions are as follows:  1. History of  prior total joint infection  2. Severely immunocompromised (Organ Transplant, cancer chemotherapy, Rheumatoid biologic meds such as Humera)  3. Poorly controlled diabetes (A1C &gt; 8.0, blood glucose over 200)  If you have one of these conditions, contact your surgeon for an antibiotic prescription, prior to your dental procedure.   Increase activity slowly as tolerated   Complete by: As directed    Post-operative opioid taper instructions:   Complete by: As directed    POST-OPERATIVE OPIOID TAPER INSTRUCTIONS: It is important to wean off of your opioid medication as soon as possible. If you do not need pain medication after your surgery it is ok to stop day one. Opioids include: Codeine, Hydrocodone (Norco, Vicodin), Oxycodone (Percocet, oxycontin ) and hydromorphone  amongst others.  Long term and even short term use of opiods can cause: Increased pain response Dependence Constipation Depression Respiratory depression And more.  Withdrawal symptoms can include Flu like symptoms Nausea, vomiting And more Techniques to manage these symptoms Hydrate well Eat regular healthy meals Stay active Use relaxation techniques(deep breathing, meditating, yoga) Do Not substitute Alcohol  to help with tapering If you have been on opioids for less than two weeks and do not have pain than it is ok to stop all together.  Plan to wean off of opioids This plan should start within one week post op of your joint replacement. Maintain the same interval or time between taking each dose and first decrease the dose.  Cut the total daily intake of opioids by one tablet each day Next start to increase the time between doses. The last dose that should  be eliminated is the evening dose.           Follow-up Information     [facility] Follow up.   Specialty: Home Health Services Why: [facility] will contact you for the first home visit. Contact information: [address] [place] [address] [phone]                  Signed: Herlene Calix 09/11/2024, 11:25 AM

## 2024-09-11 NOTE — Progress Notes (Signed)
  Subjective: Bianca Ware is a 79 y.o. female s/p right RSA.  They are POD 1.  Pt's pain is controlled.  Patient denies any complaints of chest pain, shortness of breath, abdominal pain.  Block still in effect.  Has been ambulatory without any significant difficulty.  Objective: Vital signs in last 24 hours:    Intake/Output from previous day: No intake/output data recorded. Intake/Output this shift: No intake/output data recorded.  Exam:  No gross blood or drainage overlying the dressing 2+ radial pulse of the operative extremity Postoperative physical exam somewhat limited by interscalene block but intact finger flexion but that is about it.   Labs: No results for input(s): HGB in the last 72 hours. No results for input(s): WBC, RBC, HCT, PLT in the last 72 hours. No results for input(s): NA, K, CL, CO2, BUN, CREATININE, GLUCOSE, CALCIUM  in the last 72 hours. No results for input(s): LABPT, INR in the last 72 hours.  Assessment/Plan: Pt is POD 1 s/p right RSA    -Plan to discharge to home today after working with occupational therapy.  -No lifting with the operative arm  -Stay in sling except for showering/sleeping and using CPM machine at home.  No lifting with the operative arm more than 1 to 2 pounds  -Follow-up with Dr. Addie in clinic 2 weeks postoperatively     [facility] 09/11/2024, 11:30 AM

## 2024-09-12 ENCOUNTER — Telehealth: Payer: Self-pay | Admitting: Orthopedic Surgery

## 2024-09-12 NOTE — Telephone Encounter (Signed)
 Okay for full PROM and AROM except no ROM behind the back and no external rotation past 30 degrees.  No lifting.

## 2024-09-12 NOTE — Telephone Encounter (Signed)
 Called and advised.

## 2024-09-12 NOTE — Telephone Encounter (Signed)
 Dot from [facility] called for verbal orders for PT and OT starting Wednesday October 22.2025. CB#[phone]

## 2024-09-14 DIAGNOSIS — E785 Hyperlipidemia, unspecified: Secondary | ICD-10-CM | POA: Diagnosis not present

## 2024-09-14 DIAGNOSIS — Z96643 Presence of artificial hip joint, bilateral: Secondary | ICD-10-CM | POA: Diagnosis not present

## 2024-09-14 DIAGNOSIS — Z8701 Personal history of pneumonia (recurrent): Secondary | ICD-10-CM | POA: Diagnosis not present

## 2024-09-14 DIAGNOSIS — Z9181 History of falling: Secondary | ICD-10-CM | POA: Diagnosis not present

## 2024-09-14 DIAGNOSIS — Z471 Aftercare following joint replacement surgery: Secondary | ICD-10-CM | POA: Diagnosis not present

## 2024-09-14 DIAGNOSIS — M19042 Primary osteoarthritis, left hand: Secondary | ICD-10-CM | POA: Diagnosis not present

## 2024-09-14 DIAGNOSIS — Z7982 Long term (current) use of aspirin: Secondary | ICD-10-CM | POA: Diagnosis not present

## 2024-09-14 DIAGNOSIS — Z96651 Presence of right artificial knee joint: Secondary | ICD-10-CM | POA: Diagnosis not present

## 2024-09-14 DIAGNOSIS — I10 Essential (primary) hypertension: Secondary | ICD-10-CM | POA: Diagnosis not present

## 2024-09-14 DIAGNOSIS — I351 Nonrheumatic aortic (valve) insufficiency: Secondary | ICD-10-CM | POA: Diagnosis not present

## 2024-09-14 DIAGNOSIS — M1712 Unilateral primary osteoarthritis, left knee: Secondary | ICD-10-CM | POA: Diagnosis not present

## 2024-09-14 DIAGNOSIS — M19041 Primary osteoarthritis, right hand: Secondary | ICD-10-CM | POA: Diagnosis not present

## 2024-09-14 DIAGNOSIS — Z96611 Presence of right artificial shoulder joint: Secondary | ICD-10-CM | POA: Diagnosis not present

## 2024-09-14 DIAGNOSIS — Z87891 Personal history of nicotine dependence: Secondary | ICD-10-CM | POA: Diagnosis not present

## 2024-09-15 ENCOUNTER — Telehealth: Payer: Self-pay

## 2024-09-15 DIAGNOSIS — M19041 Primary osteoarthritis, right hand: Secondary | ICD-10-CM | POA: Diagnosis not present

## 2024-09-15 DIAGNOSIS — Z8701 Personal history of pneumonia (recurrent): Secondary | ICD-10-CM | POA: Diagnosis not present

## 2024-09-15 DIAGNOSIS — E785 Hyperlipidemia, unspecified: Secondary | ICD-10-CM | POA: Diagnosis not present

## 2024-09-15 DIAGNOSIS — Z96611 Presence of right artificial shoulder joint: Secondary | ICD-10-CM | POA: Diagnosis not present

## 2024-09-15 DIAGNOSIS — I10 Essential (primary) hypertension: Secondary | ICD-10-CM | POA: Diagnosis not present

## 2024-09-15 DIAGNOSIS — M19042 Primary osteoarthritis, left hand: Secondary | ICD-10-CM | POA: Diagnosis not present

## 2024-09-15 DIAGNOSIS — Z9181 History of falling: Secondary | ICD-10-CM | POA: Diagnosis not present

## 2024-09-15 DIAGNOSIS — Z96643 Presence of artificial hip joint, bilateral: Secondary | ICD-10-CM | POA: Diagnosis not present

## 2024-09-15 DIAGNOSIS — Z7982 Long term (current) use of aspirin: Secondary | ICD-10-CM | POA: Diagnosis not present

## 2024-09-15 DIAGNOSIS — Z96651 Presence of right artificial knee joint: Secondary | ICD-10-CM | POA: Diagnosis not present

## 2024-09-15 DIAGNOSIS — Z471 Aftercare following joint replacement surgery: Secondary | ICD-10-CM | POA: Diagnosis not present

## 2024-09-15 DIAGNOSIS — Z87891 Personal history of nicotine dependence: Secondary | ICD-10-CM | POA: Diagnosis not present

## 2024-09-15 DIAGNOSIS — I351 Nonrheumatic aortic (valve) insufficiency: Secondary | ICD-10-CM | POA: Diagnosis not present

## 2024-09-15 DIAGNOSIS — M1712 Unilateral primary osteoarthritis, left knee: Secondary | ICD-10-CM | POA: Diagnosis not present

## 2024-09-15 NOTE — Telephone Encounter (Signed)
 Bianca Ware from [facility] called and is needing a copy of shoulder protocol emailed to him at [email or web address]    CB [phone]

## 2024-09-17 DIAGNOSIS — M19011 Primary osteoarthritis, right shoulder: Secondary | ICD-10-CM

## 2024-09-19 NOTE — Telephone Encounter (Signed)
 For shoulder protocol, I would just recommend full active and passive range of motion of the operative shoulder except for no range of motion behind the back or any external rotation past 40 degrees.  Okay for deltoid isometric strengthening to progress to more strengthening exercises around 4 to 6 weeks postop.  Okay to progress past 40 degrees of external rotation around 6 weeks

## 2024-09-19 NOTE — Telephone Encounter (Signed)
 Ok thx.

## 2024-09-20 DIAGNOSIS — Z471 Aftercare following joint replacement surgery: Secondary | ICD-10-CM | POA: Diagnosis not present

## 2024-09-20 DIAGNOSIS — Z96651 Presence of right artificial knee joint: Secondary | ICD-10-CM | POA: Diagnosis not present

## 2024-09-20 DIAGNOSIS — M19042 Primary osteoarthritis, left hand: Secondary | ICD-10-CM | POA: Diagnosis not present

## 2024-09-20 DIAGNOSIS — I351 Nonrheumatic aortic (valve) insufficiency: Secondary | ICD-10-CM | POA: Diagnosis not present

## 2024-09-20 DIAGNOSIS — E785 Hyperlipidemia, unspecified: Secondary | ICD-10-CM | POA: Diagnosis not present

## 2024-09-20 DIAGNOSIS — M1712 Unilateral primary osteoarthritis, left knee: Secondary | ICD-10-CM | POA: Diagnosis not present

## 2024-09-20 DIAGNOSIS — Z9181 History of falling: Secondary | ICD-10-CM | POA: Diagnosis not present

## 2024-09-20 DIAGNOSIS — Z87891 Personal history of nicotine dependence: Secondary | ICD-10-CM | POA: Diagnosis not present

## 2024-09-20 DIAGNOSIS — I10 Essential (primary) hypertension: Secondary | ICD-10-CM | POA: Diagnosis not present

## 2024-09-20 DIAGNOSIS — Z96611 Presence of right artificial shoulder joint: Secondary | ICD-10-CM | POA: Diagnosis not present

## 2024-09-20 DIAGNOSIS — Z8701 Personal history of pneumonia (recurrent): Secondary | ICD-10-CM | POA: Diagnosis not present

## 2024-09-20 DIAGNOSIS — Z96643 Presence of artificial hip joint, bilateral: Secondary | ICD-10-CM | POA: Diagnosis not present

## 2024-09-20 DIAGNOSIS — Z7982 Long term (current) use of aspirin: Secondary | ICD-10-CM | POA: Diagnosis not present

## 2024-09-20 DIAGNOSIS — M19041 Primary osteoarthritis, right hand: Secondary | ICD-10-CM | POA: Diagnosis not present

## 2024-09-20 NOTE — Telephone Encounter (Signed)
 IC advised.

## 2024-09-21 DIAGNOSIS — Z96611 Presence of right artificial shoulder joint: Secondary | ICD-10-CM | POA: Diagnosis not present

## 2024-09-21 DIAGNOSIS — Z7982 Long term (current) use of aspirin: Secondary | ICD-10-CM | POA: Diagnosis not present

## 2024-09-21 DIAGNOSIS — M19042 Primary osteoarthritis, left hand: Secondary | ICD-10-CM | POA: Diagnosis not present

## 2024-09-21 DIAGNOSIS — Z9181 History of falling: Secondary | ICD-10-CM | POA: Diagnosis not present

## 2024-09-21 DIAGNOSIS — Z96643 Presence of artificial hip joint, bilateral: Secondary | ICD-10-CM | POA: Diagnosis not present

## 2024-09-21 DIAGNOSIS — Z96651 Presence of right artificial knee joint: Secondary | ICD-10-CM | POA: Diagnosis not present

## 2024-09-21 DIAGNOSIS — Z87891 Personal history of nicotine dependence: Secondary | ICD-10-CM | POA: Diagnosis not present

## 2024-09-21 DIAGNOSIS — E785 Hyperlipidemia, unspecified: Secondary | ICD-10-CM | POA: Diagnosis not present

## 2024-09-21 DIAGNOSIS — M1712 Unilateral primary osteoarthritis, left knee: Secondary | ICD-10-CM | POA: Diagnosis not present

## 2024-09-21 DIAGNOSIS — Z471 Aftercare following joint replacement surgery: Secondary | ICD-10-CM | POA: Diagnosis not present

## 2024-09-21 DIAGNOSIS — Z8701 Personal history of pneumonia (recurrent): Secondary | ICD-10-CM | POA: Diagnosis not present

## 2024-09-21 DIAGNOSIS — I10 Essential (primary) hypertension: Secondary | ICD-10-CM | POA: Diagnosis not present

## 2024-09-21 DIAGNOSIS — I351 Nonrheumatic aortic (valve) insufficiency: Secondary | ICD-10-CM | POA: Diagnosis not present

## 2024-09-21 DIAGNOSIS — M19041 Primary osteoarthritis, right hand: Secondary | ICD-10-CM | POA: Diagnosis not present

## 2024-09-23 ENCOUNTER — Other Ambulatory Visit (INDEPENDENT_AMBULATORY_CARE_PROVIDER_SITE_OTHER): Payer: Self-pay

## 2024-09-23 ENCOUNTER — Telehealth: Payer: Self-pay | Admitting: Radiology

## 2024-09-23 ENCOUNTER — Ambulatory Visit: Admitting: Surgical

## 2024-09-23 ENCOUNTER — Encounter: Payer: Self-pay | Admitting: Surgical

## 2024-09-23 DIAGNOSIS — Z96611 Presence of right artificial shoulder joint: Secondary | ICD-10-CM | POA: Diagnosis not present

## 2024-09-23 MED ORDER — GABAPENTIN 300 MG PO CAPS: 300.0000 mg | ORAL_CAPSULE | Freq: Three times a day (TID) | ORAL | 0 refills | Status: AC

## 2024-09-23 NOTE — Progress Notes (Signed)
 Post-Op Visit Note   Patient: Bianca Ware           Date of Birth: 11-Jul-1946           MRN: [id number] Visit Date: 09/23/2024 PCP: Janey Santos, MD   Assessment & Plan:  Chief Complaint:  Chief Complaint  Patient presents with   Right Shoulder - Routine Post Op    09/08/2024 Right RSA   Visit Diagnoses:  1. S/P reverse total shoulder arthroplasty, right     Plan: Bianca Ware is a 79 y.o. female who presents s/p right reverse shoulder arthroplasty on 09/08/2024.  Patient is doing well and pain is overall controlled.  Performing pendulum exercises and other range of motion exercises with home health therapy..  Denies any chest pain, SOB, fevers, chills.  No complaint of any instability symptoms.  Having some numbness in her right hand in the median nerve distribution with tingling in the tips of the fingers of the thumb, index, long finger.  Minimal symptoms in the dorsum of the hands.  No symptoms with numbness through the rest of the arm..    On exam, patient has range of motion 15 degrees X rotation, 70 degrees abduction, 120 degrees of forward elevation passively.  Intact EPL, FPL, finger abduction, finger adduction, pronation/supination, bicep, tricep, deltoid of operative extremity.  Axillary nerve intact with deltoid firing.  Incision is healing well without evidence of infection or dehiscence.  Incision was made sure to be covered with Steri-Strips from the proximal to distal aspect of the length of the incision.  2+ radial pulse of the operative extremity  Plan is discontinue sling.  Okay to very lightly lift with the operative extremity but no lifting anything heavier than a coffee cup or cell phone.  Continue home health physical therapy to focus on passive range of motion and active range of motion with deltoid isometrics.  Do not want to externally rotate past 30 degrees to protect subscapularis repair.  Follow-up in 4 weeks for clinical recheck with Dr.  Addie.  Impression regarding her continued numbness and tingling is likely carpal tunnel syndrome postoperatively due to the swelling in her arm.  Could also be long-lasting effect from interscalene block.  Swelling is steadily improving.  Gave her an Ace wrap in order to use as a stress ball to help relieve the swelling in her arm.  If she is having continued symptoms at the next appointment, could consider nerve conduction study.  Follow-Up Instructions: No follow-ups on file.   Orders:  Orders Placed This Encounter  Procedures   XR Shoulder Right   No orders of the defined types were placed in this encounter.   Imaging: No results found.  PMFS History: Patient Active Problem List   Diagnosis Date Noted   Arthritis of right shoulder 09/17/2024   S/P reverse total shoulder arthroplasty, right 09/08/2024   Bilateral carotid artery stenosis 04/26/2024   Atherosclerotic heart disease of native coronary artery without angina pectoris 04/26/2024   Hypertension 04/26/2024   Other insomnia 04/26/2024   Status post total replacement of right hip 07/24/2023   Unilateral primary osteoarthritis, right hip 06/05/2022   Obesity 09/23/2019   Status post total knee replacement, right 10/01/2018   Chronic pain of left knee 08/11/2018   Unilateral primary osteoarthritis, left knee 08/11/2018   Chronic pain of right knee 02/22/2018   Status post total replacement of left hip 09/07/2015   Lumbar disc herniation with radiculopathy 05/29/2015    Class:  Acute   Spondylolisthesis of lumbar region 05/29/2015    Class: Chronic   Hyperlipidemia 06/17/2013   Irritable bowel syndrome 06/17/2013   Osteoarthritis 06/17/2013   Past Medical History:  Diagnosis Date   Aortic insufficiency 2024   Arthritis    hands   Complication of anesthesia    waking up   Elevated coronary artery calcium  score    OA (osteoarthritis) of knee    right and left   Pneumonia    PONV (postoperative nausea and  vomiting)    Seasonal allergies    Wears glasses     Family History  Problem Relation Age of Onset   Arrhythmia Mother        pacemaker   Breast cancer Sister    Heart attack Brother     Past Surgical History:  Procedure Laterality Date   ABDOMINAL HYSTERECTOMY  1970s   BLADDER SUSPENSION  2002   w/ urethral sling   CATARACT EXTRACTION W/ INTRAOCULAR LENS IMPLANT Bilateral 2018   CESAREAN SECTION  x2   yrs ago   DILATION AND CURETTAGE OF UTERUS  1960s   LUMBAR LAMINECTOMY N/A 05/29/2015   Procedure: LEFT L4-5 MICRODISCECTOMY;  Surgeon: Lynwood FORBES Better, MD;  Location: [facility] OR;  Service: Orthopedics;  Laterality: N/A;   REVERSE SHOULDER ARTHROPLASTY Right 09/08/2024   Procedure: RIGHT REVERSE SHOULDER ARTHROPLASTY;  Surgeon: Addie Cordella [facility], MD;  Location: [facility] OR;  Service: Orthopedics;  Laterality: Right;   TONSILLECTOMY  child   TOTAL HIP ARTHROPLASTY Left 09/07/2015   Procedure: LEFT TOTAL HIP ARTHROPLASTY ANTERIOR APPROACH;  Surgeon: Lonni CINDERELLA Poli, MD;  Location: [facility] ORS;  Service: Orthopedics;  Laterality: Left;   TOTAL HIP ARTHROPLASTY Right 07/24/2023   Procedure: RIGHT TOTAL HIP ARTHROPLASTY ANTERIOR APPROACH;  Surgeon: Poli Lonni CINDERELLA, MD;  Location: [facility] ORS;  Service: Orthopedics;  Laterality: Right;   TOTAL KNEE ARTHROPLASTY Right 10/01/2018   Procedure: RIGHT TOTAL KNEE ARTHROPLASTY;  Surgeon: Poli Lonni CINDERELLA, MD;  Location: [facility] ORS;  Service: Orthopedics;  Laterality: Right;   TUBAL LIGATION Bilateral yrs ago   Social History   Occupational History   Not on file  Tobacco Use   Smoking status: Former    Current packs/day: 0.00    Average packs/day: 0.3 packs/day for 15.0 years (3.8 ttl pk-yrs)    Types: Cigarettes    Start date: 05/24/1962    Quit date: 05/24/1977    Years since quitting: 47.3   Smokeless tobacco: Never  Vaping Use   Vaping status: Never Used  Substance and Sexual Activity   Alcohol  use: No   Drug use: Never   Sexual  activity: Not on file

## 2024-09-23 NOTE — Telephone Encounter (Signed)
 I called Bianca Ware, HHPT with [facility], and left voicemail to extend patient's HHPT 1x week x 4 weeks. Asked for return call if anything else needed from me.

## 2024-09-26 ENCOUNTER — Encounter: Payer: Self-pay | Admitting: Radiology

## 2024-09-27 DIAGNOSIS — Z87891 Personal history of nicotine dependence: Secondary | ICD-10-CM | POA: Diagnosis not present

## 2024-09-27 DIAGNOSIS — Z96611 Presence of right artificial shoulder joint: Secondary | ICD-10-CM | POA: Diagnosis not present

## 2024-09-27 DIAGNOSIS — E785 Hyperlipidemia, unspecified: Secondary | ICD-10-CM | POA: Diagnosis not present

## 2024-09-27 DIAGNOSIS — I351 Nonrheumatic aortic (valve) insufficiency: Secondary | ICD-10-CM | POA: Diagnosis not present

## 2024-09-27 DIAGNOSIS — M19041 Primary osteoarthritis, right hand: Secondary | ICD-10-CM | POA: Diagnosis not present

## 2024-09-27 DIAGNOSIS — M1712 Unilateral primary osteoarthritis, left knee: Secondary | ICD-10-CM | POA: Diagnosis not present

## 2024-09-27 DIAGNOSIS — Z96643 Presence of artificial hip joint, bilateral: Secondary | ICD-10-CM | POA: Diagnosis not present

## 2024-09-27 DIAGNOSIS — M19042 Primary osteoarthritis, left hand: Secondary | ICD-10-CM | POA: Diagnosis not present

## 2024-09-27 DIAGNOSIS — Z7982 Long term (current) use of aspirin: Secondary | ICD-10-CM | POA: Diagnosis not present

## 2024-09-27 DIAGNOSIS — Z8701 Personal history of pneumonia (recurrent): Secondary | ICD-10-CM | POA: Diagnosis not present

## 2024-09-27 DIAGNOSIS — Z9181 History of falling: Secondary | ICD-10-CM | POA: Diagnosis not present

## 2024-09-27 DIAGNOSIS — Z96651 Presence of right artificial knee joint: Secondary | ICD-10-CM | POA: Diagnosis not present

## 2024-09-27 DIAGNOSIS — Z471 Aftercare following joint replacement surgery: Secondary | ICD-10-CM | POA: Diagnosis not present

## 2024-09-27 DIAGNOSIS — I10 Essential (primary) hypertension: Secondary | ICD-10-CM | POA: Diagnosis not present

## 2024-09-29 DIAGNOSIS — Z471 Aftercare following joint replacement surgery: Secondary | ICD-10-CM | POA: Diagnosis not present

## 2024-09-29 DIAGNOSIS — Z96643 Presence of artificial hip joint, bilateral: Secondary | ICD-10-CM | POA: Diagnosis not present

## 2024-09-29 DIAGNOSIS — Z96611 Presence of right artificial shoulder joint: Secondary | ICD-10-CM | POA: Diagnosis not present

## 2024-09-29 DIAGNOSIS — Z87891 Personal history of nicotine dependence: Secondary | ICD-10-CM | POA: Diagnosis not present

## 2024-09-29 DIAGNOSIS — Z7982 Long term (current) use of aspirin: Secondary | ICD-10-CM | POA: Diagnosis not present

## 2024-09-29 DIAGNOSIS — I10 Essential (primary) hypertension: Secondary | ICD-10-CM | POA: Diagnosis not present

## 2024-09-29 DIAGNOSIS — M19041 Primary osteoarthritis, right hand: Secondary | ICD-10-CM | POA: Diagnosis not present

## 2024-09-29 DIAGNOSIS — E785 Hyperlipidemia, unspecified: Secondary | ICD-10-CM | POA: Diagnosis not present

## 2024-09-29 DIAGNOSIS — M1712 Unilateral primary osteoarthritis, left knee: Secondary | ICD-10-CM | POA: Diagnosis not present

## 2024-09-29 DIAGNOSIS — M19042 Primary osteoarthritis, left hand: Secondary | ICD-10-CM | POA: Diagnosis not present

## 2024-09-29 DIAGNOSIS — Z96651 Presence of right artificial knee joint: Secondary | ICD-10-CM | POA: Diagnosis not present

## 2024-09-29 DIAGNOSIS — I351 Nonrheumatic aortic (valve) insufficiency: Secondary | ICD-10-CM | POA: Diagnosis not present

## 2024-09-29 DIAGNOSIS — Z9181 History of falling: Secondary | ICD-10-CM | POA: Diagnosis not present

## 2024-09-29 DIAGNOSIS — Z8701 Personal history of pneumonia (recurrent): Secondary | ICD-10-CM | POA: Diagnosis not present

## 2024-10-04 DIAGNOSIS — Z87891 Personal history of nicotine dependence: Secondary | ICD-10-CM | POA: Diagnosis not present

## 2024-10-04 DIAGNOSIS — Z96651 Presence of right artificial knee joint: Secondary | ICD-10-CM | POA: Diagnosis not present

## 2024-10-04 DIAGNOSIS — Z96643 Presence of artificial hip joint, bilateral: Secondary | ICD-10-CM | POA: Diagnosis not present

## 2024-10-04 DIAGNOSIS — M19041 Primary osteoarthritis, right hand: Secondary | ICD-10-CM | POA: Diagnosis not present

## 2024-10-04 DIAGNOSIS — Z471 Aftercare following joint replacement surgery: Secondary | ICD-10-CM | POA: Diagnosis not present

## 2024-10-04 DIAGNOSIS — Z8701 Personal history of pneumonia (recurrent): Secondary | ICD-10-CM | POA: Diagnosis not present

## 2024-10-04 DIAGNOSIS — Z9181 History of falling: Secondary | ICD-10-CM | POA: Diagnosis not present

## 2024-10-04 DIAGNOSIS — I10 Essential (primary) hypertension: Secondary | ICD-10-CM | POA: Diagnosis not present

## 2024-10-04 DIAGNOSIS — Z96611 Presence of right artificial shoulder joint: Secondary | ICD-10-CM | POA: Diagnosis not present

## 2024-10-04 DIAGNOSIS — M19042 Primary osteoarthritis, left hand: Secondary | ICD-10-CM | POA: Diagnosis not present

## 2024-10-04 DIAGNOSIS — E785 Hyperlipidemia, unspecified: Secondary | ICD-10-CM | POA: Diagnosis not present

## 2024-10-04 DIAGNOSIS — Z7982 Long term (current) use of aspirin: Secondary | ICD-10-CM | POA: Diagnosis not present

## 2024-10-04 DIAGNOSIS — I351 Nonrheumatic aortic (valve) insufficiency: Secondary | ICD-10-CM | POA: Diagnosis not present

## 2024-10-04 DIAGNOSIS — M1712 Unilateral primary osteoarthritis, left knee: Secondary | ICD-10-CM | POA: Diagnosis not present

## 2024-10-06 DIAGNOSIS — Z87891 Personal history of nicotine dependence: Secondary | ICD-10-CM | POA: Diagnosis not present

## 2024-10-06 DIAGNOSIS — Z7982 Long term (current) use of aspirin: Secondary | ICD-10-CM | POA: Diagnosis not present

## 2024-10-06 DIAGNOSIS — Z96643 Presence of artificial hip joint, bilateral: Secondary | ICD-10-CM | POA: Diagnosis not present

## 2024-10-06 DIAGNOSIS — Z96611 Presence of right artificial shoulder joint: Secondary | ICD-10-CM | POA: Diagnosis not present

## 2024-10-06 DIAGNOSIS — M19042 Primary osteoarthritis, left hand: Secondary | ICD-10-CM | POA: Diagnosis not present

## 2024-10-06 DIAGNOSIS — M19041 Primary osteoarthritis, right hand: Secondary | ICD-10-CM | POA: Diagnosis not present

## 2024-10-06 DIAGNOSIS — E785 Hyperlipidemia, unspecified: Secondary | ICD-10-CM | POA: Diagnosis not present

## 2024-10-06 DIAGNOSIS — Z471 Aftercare following joint replacement surgery: Secondary | ICD-10-CM | POA: Diagnosis not present

## 2024-10-06 DIAGNOSIS — Z96651 Presence of right artificial knee joint: Secondary | ICD-10-CM | POA: Diagnosis not present

## 2024-10-06 DIAGNOSIS — I351 Nonrheumatic aortic (valve) insufficiency: Secondary | ICD-10-CM | POA: Diagnosis not present

## 2024-10-06 DIAGNOSIS — M1712 Unilateral primary osteoarthritis, left knee: Secondary | ICD-10-CM | POA: Diagnosis not present

## 2024-10-06 DIAGNOSIS — Z8701 Personal history of pneumonia (recurrent): Secondary | ICD-10-CM | POA: Diagnosis not present

## 2024-10-06 DIAGNOSIS — Z9181 History of falling: Secondary | ICD-10-CM | POA: Diagnosis not present

## 2024-10-06 DIAGNOSIS — I10 Essential (primary) hypertension: Secondary | ICD-10-CM | POA: Diagnosis not present

## 2024-10-13 DIAGNOSIS — Z9181 History of falling: Secondary | ICD-10-CM | POA: Diagnosis not present

## 2024-10-13 DIAGNOSIS — I351 Nonrheumatic aortic (valve) insufficiency: Secondary | ICD-10-CM | POA: Diagnosis not present

## 2024-10-13 DIAGNOSIS — Z87891 Personal history of nicotine dependence: Secondary | ICD-10-CM | POA: Diagnosis not present

## 2024-10-13 DIAGNOSIS — M19041 Primary osteoarthritis, right hand: Secondary | ICD-10-CM | POA: Diagnosis not present

## 2024-10-13 DIAGNOSIS — Z471 Aftercare following joint replacement surgery: Secondary | ICD-10-CM | POA: Diagnosis not present

## 2024-10-13 DIAGNOSIS — M19042 Primary osteoarthritis, left hand: Secondary | ICD-10-CM | POA: Diagnosis not present

## 2024-10-13 DIAGNOSIS — Z96643 Presence of artificial hip joint, bilateral: Secondary | ICD-10-CM | POA: Diagnosis not present

## 2024-10-13 DIAGNOSIS — Z96611 Presence of right artificial shoulder joint: Secondary | ICD-10-CM | POA: Diagnosis not present

## 2024-10-13 DIAGNOSIS — M1712 Unilateral primary osteoarthritis, left knee: Secondary | ICD-10-CM | POA: Diagnosis not present

## 2024-10-13 DIAGNOSIS — I10 Essential (primary) hypertension: Secondary | ICD-10-CM | POA: Diagnosis not present

## 2024-10-13 DIAGNOSIS — Z96651 Presence of right artificial knee joint: Secondary | ICD-10-CM | POA: Diagnosis not present

## 2024-10-13 DIAGNOSIS — E785 Hyperlipidemia, unspecified: Secondary | ICD-10-CM | POA: Diagnosis not present

## 2024-10-13 DIAGNOSIS — Z8701 Personal history of pneumonia (recurrent): Secondary | ICD-10-CM | POA: Diagnosis not present

## 2024-10-13 DIAGNOSIS — Z7982 Long term (current) use of aspirin: Secondary | ICD-10-CM | POA: Diagnosis not present

## 2024-10-24 ENCOUNTER — Ambulatory Visit: Admitting: Orthopedic Surgery

## 2024-10-24 DIAGNOSIS — Z96611 Presence of right artificial shoulder joint: Secondary | ICD-10-CM

## 2024-10-25 ENCOUNTER — Telehealth: Payer: Self-pay

## 2024-10-25 ENCOUNTER — Encounter: Payer: Self-pay | Admitting: Orthopedic Surgery

## 2024-10-25 NOTE — Progress Notes (Unsigned)
 Post-Op Visit Note   Patient: Bianca Ware           Date of Birth: November 19, 1946           MRN: [id number] Visit Date: 10/24/2024 PCP: Janey Santos, MD   Assessment & Plan:  Chief Complaint:  Chief Complaint  Patient presents with   Right Shoulder - Routine Post Op    09/08/2024 Right RSA   Visit Diagnoses:  1. S/P reverse total shoulder arthroplasty, right     Plan: Bianca Ware is a 78 year old patient underwent right reverse shoulder replacement on 09/08/2024.  She has been doing well with her surgery.  Reporting a little bit of numbness in digits 1 2 and 3 on the hand which is getting better.  This is likely left over from the block.  Taking arthritis Tylenol  and Neurontin  with occasional soreness after PT.  She is doing a lot of exercising and Occupational Therapy with Aureliano Minder at [facility].  He has been in excellent therapist for [name].  They are doing exercises 1 time a week.  She also has a good home exercise program.  At this time her range of motion is 30/85/140.  Subscap strength is 5- out of 5 external rotation strength 5- out of 5.  She is okay to start strengthening.  I would like for her to continue home health PT once every 2 weeks for the next 6 weeks with a home exercise program and I will see her back in 6 weeks.  Follow-Up Instructions: No follow-ups on file.   Orders:  No orders of the defined types were placed in this encounter.  No orders of the defined types were placed in this encounter.   Imaging: No results found.  PMFS History: Patient Active Problem List   Diagnosis Date Noted   Arthritis of right shoulder 09/17/2024   S/P reverse total shoulder arthroplasty, right 09/08/2024   Bilateral carotid artery stenosis 04/26/2024   Atherosclerotic heart disease of native coronary artery without angina pectoris 04/26/2024   Hypertension 04/26/2024   Other insomnia 04/26/2024   Status post total replacement of right hip 07/24/2023   Unilateral  primary osteoarthritis, right hip 06/05/2022   Obesity 09/23/2019   Status post total knee replacement, right 10/01/2018   Chronic pain of left knee 08/11/2018   Unilateral primary osteoarthritis, left knee 08/11/2018   Chronic pain of right knee 02/22/2018   Status post total replacement of left hip 09/07/2015   Lumbar disc herniation with radiculopathy 05/29/2015    Class: Acute   Spondylolisthesis of lumbar region 05/29/2015    Class: Chronic   Hyperlipidemia 06/17/2013   Irritable bowel syndrome 06/17/2013   Osteoarthritis 06/17/2013   Past Medical History:  Diagnosis Date   Aortic insufficiency 2024   Arthritis    hands   Complication of anesthesia    waking up   Elevated coronary artery calcium  score    OA (osteoarthritis) of knee    right and left   Pneumonia    PONV (postoperative nausea and vomiting)    Seasonal allergies    Wears glasses     Family History  Problem Relation Age of Onset   Arrhythmia Mother        pacemaker   Breast cancer Sister    Heart attack Brother     Past Surgical History:  Procedure Laterality Date   ABDOMINAL HYSTERECTOMY  1970s   BLADDER SUSPENSION  2002   w/ urethral sling   CATARACT EXTRACTION W/  INTRAOCULAR LENS IMPLANT Bilateral 2018   CESAREAN SECTION  x2   yrs ago   DILATION AND CURETTAGE OF UTERUS  1960s   LUMBAR LAMINECTOMY N/A 05/29/2015   Procedure: LEFT L4-5 MICRODISCECTOMY;  Surgeon: Lynwood FORBES Better, MD;  Location: [facility] OR;  Service: Orthopedics;  Laterality: N/A;   REVERSE SHOULDER ARTHROPLASTY Right 09/08/2024   Procedure: RIGHT REVERSE SHOULDER ARTHROPLASTY;  Surgeon: Addie Cordella [facility], MD;  Location: [facility] OR;  Service: Orthopedics;  Laterality: Right;   TONSILLECTOMY  child   TOTAL HIP ARTHROPLASTY Left 09/07/2015   Procedure: LEFT TOTAL HIP ARTHROPLASTY ANTERIOR APPROACH;  Surgeon: Lonni CINDERELLA Poli, MD;  Location: [facility] ORS;  Service: Orthopedics;  Laterality: Left;   TOTAL HIP ARTHROPLASTY Right 07/24/2023    Procedure: RIGHT TOTAL HIP ARTHROPLASTY ANTERIOR APPROACH;  Surgeon: Poli Lonni CINDERELLA, MD;  Location: [facility] ORS;  Service: Orthopedics;  Laterality: Right;   TOTAL KNEE ARTHROPLASTY Right 10/01/2018   Procedure: RIGHT TOTAL KNEE ARTHROPLASTY;  Surgeon: Poli Lonni CINDERELLA, MD;  Location: [facility] ORS;  Service: Orthopedics;  Laterality: Right;   TUBAL LIGATION Bilateral yrs ago   Social History   Occupational History   Not on file  Tobacco Use   Smoking status: Former    Current packs/day: 0.00    Average packs/day: 0.3 packs/day for 15.0 years (3.8 ttl pk-yrs)    Types: Cigarettes    Start date: 05/24/1962    Quit date: 05/24/1977    Years since quitting: 47.4   Smokeless tobacco: Never  Vaping Use   Vaping status: Never Used  Substance and Sexual Activity   Alcohol  use: No   Drug use: Never   Sexual activity: Not on file

## 2024-10-25 NOTE — Telephone Encounter (Signed)
 Aureliano with [facility] called concerning home care for patient and how often patient would need to be seen.  CB# [phone].  Please advise.  Thank you.

## 2024-11-11 ENCOUNTER — Telehealth: Payer: Self-pay | Admitting: Cardiology

## 2024-11-11 NOTE — Telephone Encounter (Signed)
 Moving forward pt would like all medications sent to [facility]  [place], [place] - [address]

## 2024-11-11 NOTE — Telephone Encounter (Signed)
 Spoke with patient. Updated preferred pharmacy to [facility]. States that she gets her Repatha  through a grant with community pharmacy but for other meds send them to [facility].

## 2024-12-05 ENCOUNTER — Ambulatory Visit: Admitting: Orthopedic Surgery

## 2024-12-05 ENCOUNTER — Encounter: Payer: Self-pay | Admitting: Orthopedic Surgery

## 2024-12-05 DIAGNOSIS — Z96611 Presence of right artificial shoulder joint: Secondary | ICD-10-CM

## 2024-12-05 NOTE — Progress Notes (Unsigned)
 "  Post-Op Visit Note   Patient: Bianca Ware           Date of Birth: Jul 07, 1946           MRN: [id number] Visit Date: 12/05/2024 PCP: Janey Santos, MD   Assessment & Plan:  Chief Complaint:  Chief Complaint  Patient presents with   Right Shoulder - Routine Post Op    09/08/2024 Right RSA   Visit Diagnoses:  1. S/P reverse total shoulder arthroplasty, right     Plan: [name] is a patient who is now about 3 months out right reverse shoulder replacement.  She has finished physical therapy.  Doing well overall.  Does have some occasional issues with popping.  Essentially [name] can do what she wants.  On exam she has range of motion of 40/85/130.  She has good external rotation strength as well as subscap strength.  I took [name] through a pretty extensive range of motion today and did not feel any mechanical symptoms in the shoulder.  Did have a little bit of AC joint tenderness which may be the source of some of her pain and popping.  Plan at this time is to continue stretching and range of motion exercises as well as strengthening exercises.  Follow-up with us  as needed.  Overall she has good functional range of motion and good pain relief.  Follow-Up Instructions: No follow-ups on file.   Orders:  No orders of the defined types were placed in this encounter.  No orders of the defined types were placed in this encounter.   Imaging: No results found.  PMFS History: Patient Active Problem List   Diagnosis Date Noted   Arthritis of right shoulder 09/17/2024   S/P reverse total shoulder arthroplasty, right 09/08/2024   Bilateral carotid artery stenosis 04/26/2024   Atherosclerotic heart disease of native coronary artery without angina pectoris 04/26/2024   Hypertension 04/26/2024   Other insomnia 04/26/2024   Status post total replacement of right hip 07/24/2023   Unilateral primary osteoarthritis, right hip 06/05/2022   Obesity 09/23/2019   Status post total knee  replacement, right 10/01/2018   Chronic pain of left knee 08/11/2018   Unilateral primary osteoarthritis, left knee 08/11/2018   Chronic pain of right knee 02/22/2018   Status post total replacement of left hip 09/07/2015   Lumbar disc herniation with radiculopathy 05/29/2015    Class: Acute   Spondylolisthesis of lumbar region 05/29/2015    Class: Chronic   Hyperlipidemia 06/17/2013   Irritable bowel syndrome 06/17/2013   Osteoarthritis 06/17/2013   Past Medical History:  Diagnosis Date   Aortic insufficiency 2024   Arthritis    hands   Complication of anesthesia    waking up   Elevated coronary artery calcium  score    OA (osteoarthritis) of knee    right and left   Pneumonia    PONV (postoperative nausea and vomiting)    Seasonal allergies    Wears glasses     Family History  Problem Relation Age of Onset   Arrhythmia Mother        pacemaker   Breast cancer Sister    Heart attack Brother     Past Surgical History:  Procedure Laterality Date   ABDOMINAL HYSTERECTOMY  1970s   BLADDER SUSPENSION  2002   w/ urethral sling   CATARACT EXTRACTION W/ INTRAOCULAR LENS IMPLANT Bilateral 2018   CESAREAN SECTION  x2   yrs ago   DILATION AND CURETTAGE OF UTERUS  1960s  LUMBAR LAMINECTOMY N/A 05/29/2015   Procedure: LEFT L4-5 MICRODISCECTOMY;  Surgeon: Lynwood FORBES Better, MD;  Location: [facility] OR;  Service: Orthopedics;  Laterality: N/A;   REVERSE SHOULDER ARTHROPLASTY Right 09/08/2024   Procedure: RIGHT REVERSE SHOULDER ARTHROPLASTY;  Surgeon: Addie Cordella Hamilton, MD;  Location: [facility] OR;  Service: Orthopedics;  Laterality: Right;   TONSILLECTOMY  child   TOTAL HIP ARTHROPLASTY Left 09/07/2015   Procedure: LEFT TOTAL HIP ARTHROPLASTY ANTERIOR APPROACH;  Surgeon: Lonni CINDERELLA Poli, MD;  Location: [facility] ORS;  Service: Orthopedics;  Laterality: Left;   TOTAL HIP ARTHROPLASTY Right 07/24/2023   Procedure: RIGHT TOTAL HIP ARTHROPLASTY ANTERIOR APPROACH;  Surgeon: Poli Lonni CINDERELLA,  MD;  Location: [facility];  Service: Orthopedics;  Laterality: Right;   TOTAL KNEE ARTHROPLASTY Right 10/01/2018   Procedure: RIGHT TOTAL KNEE ARTHROPLASTY;  Surgeon: Poli Lonni CINDERELLA, MD;  Location: [facility];  Service: Orthopedics;  Laterality: Right;   TUBAL LIGATION Bilateral yrs ago   Social History   Occupational History   Not on file  Tobacco Use   Smoking status: Former    Current packs/day: 0.00    Average packs/day: 0.3 packs/day for 15.0 years (3.8 ttl pk-yrs)    Types: Cigarettes    Start date: 05/24/1962    Quit date: 05/24/1977    Years since quitting: 47.5   Smokeless tobacco: Never  Vaping Use   Vaping status: Never Used  Substance and Sexual Activity   Alcohol  use: No   Drug use: Never   Sexual activity: Not on file     "

## 2024-12-06 ENCOUNTER — Other Ambulatory Visit: Payer: Self-pay | Admitting: Internal Medicine

## 2024-12-06 DIAGNOSIS — Z1231 Encounter for screening mammogram for malignant neoplasm of breast: Secondary | ICD-10-CM

## 2024-12-23 ENCOUNTER — Ambulatory Visit

## 2024-12-27 ENCOUNTER — Ambulatory Visit

## 2025-01-04 ENCOUNTER — Ambulatory Visit
